# Patient Record
Sex: Female | Born: 1970 | Race: Black or African American | Hispanic: No | Marital: Single | Smoking: Never smoker
Health system: Southern US, Community
[De-identification: ages and names within clinical notes are randomized; demographics above are authoritative.]

## PROBLEM LIST (undated history)

## (undated) DIAGNOSIS — M1990 Unspecified osteoarthritis, unspecified site: Secondary | ICD-10-CM

## (undated) DIAGNOSIS — B019 Varicella without complication: Secondary | ICD-10-CM

## (undated) DIAGNOSIS — I480 Paroxysmal atrial fibrillation: Secondary | ICD-10-CM

## (undated) DIAGNOSIS — Z9289 Personal history of other medical treatment: Secondary | ICD-10-CM

## (undated) DIAGNOSIS — R7303 Prediabetes: Secondary | ICD-10-CM

## (undated) DIAGNOSIS — R519 Headache, unspecified: Secondary | ICD-10-CM

## (undated) DIAGNOSIS — K219 Gastro-esophageal reflux disease without esophagitis: Secondary | ICD-10-CM

## (undated) DIAGNOSIS — I10 Essential (primary) hypertension: Secondary | ICD-10-CM

## (undated) DIAGNOSIS — E785 Hyperlipidemia, unspecified: Secondary | ICD-10-CM

## (undated) DIAGNOSIS — D500 Iron deficiency anemia secondary to blood loss (chronic): Secondary | ICD-10-CM

## (undated) DIAGNOSIS — F32A Depression, unspecified: Secondary | ICD-10-CM

## (undated) DIAGNOSIS — G4730 Sleep apnea, unspecified: Secondary | ICD-10-CM

## (undated) DIAGNOSIS — Z5189 Encounter for other specified aftercare: Secondary | ICD-10-CM

## (undated) HISTORY — DX: Headache, unspecified: R51.9

## (undated) HISTORY — DX: Depression, unspecified: F32.A

## (undated) HISTORY — DX: Unspecified osteoarthritis, unspecified site: M19.90

## (undated) HISTORY — DX: Hyperlipidemia, unspecified: E78.5

## (undated) HISTORY — DX: Personal history of other medical treatment: Z92.89

## (undated) HISTORY — DX: Varicella without complication: B01.9

## (undated) HISTORY — PX: KNEE SURGERY: SHX244

---
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
## 2015-01-05 ENCOUNTER — Ambulatory Visit
Admission: EM | Admit: 2015-01-05 | Discharge: 2015-01-05 | Disposition: A | Discharge status: Home / Self Care | Payer: BLUE CROSS/BLUE SHIELD | Attending: Family Medicine | Admitting: Family Medicine

## 2015-01-05 ENCOUNTER — Encounter: Payer: Self-pay | Admitting: Emergency Medicine

## 2015-01-05 DIAGNOSIS — H6983 Other specified disorders of Eustachian tube, bilateral: Secondary | ICD-10-CM

## 2015-01-05 DIAGNOSIS — J029 Acute pharyngitis, unspecified: Secondary | ICD-10-CM

## 2015-01-05 HISTORY — DX: Essential (primary) hypertension: I10

## 2015-01-05 LAB — RAPID STREP SCREEN (MED CTR MEBANE ONLY): Streptococcus, Group A Screen (Direct): NEGATIVE

## 2015-01-05 MED ORDER — KETOROLAC TROMETHAMINE 60 MG/2ML IM SOLN
60.0000 mg | Freq: Once | INTRAMUSCULAR | Status: AC
  Administered 2015-01-05: 60 mg via INTRAMUSCULAR

## 2015-01-05 MED ORDER — ACETAMINOPHEN 500 MG PO TABS
1000.0000 mg | ORAL_TABLET | Freq: Once | ORAL | Status: AC
  Administered 2015-01-05: 1000 mg via ORAL

## 2015-01-05 MED ORDER — FLUTICASONE PROPIONATE 50 MCG/ACT NA SUSP: 2.0000 | Freq: Every day | NASAL | Status: DC

## 2015-01-05 MED ORDER — AMOXICILLIN-POT CLAVULANATE 875-125 MG PO TABS: 1.0000 | ORAL_TABLET | Freq: Two times a day (BID) | ORAL | Status: AC

## 2015-01-05 MED ORDER — LOSARTAN POTASSIUM-HCTZ 100-25 MG PO TABS
1.0000 | ORAL_TABLET | Freq: Every day | ORAL | Status: DC
Start: 2015-01-05 — End: 2016-05-17

## 2015-01-05 MED ORDER — AMLODIPINE BESYLATE 10 MG PO TABS: 10.0000 mg | ORAL_TABLET | Freq: Every day | ORAL | Status: DC

## 2015-01-05 NOTE — ED Notes (Signed)
Patient c/o sore throat, nasal congestion, and HAs for 3 weeks.

## 2015-01-05 NOTE — ED Provider Notes (Signed)
CSN: [id number]     Arrival date & time 01/05/15  1401 History   First MD Initiated Contact with Patient 01/05/15 1559     Chief Complaint  Patient presents with  . Sore Throat   (Consider location/radiation/quality/duration/timing/severity/associated sxs/prior Treatment) HPI 44 yo F reports 3 week history of URI, sore throat , nasal congestion, headache.  Has treated with OTCs cold combos and ibuprofen/tylenol without success Recently ( 3 months) [place]  and family has struggled with post nasal drip and congestion. Throat has been very sore, fatigued,ears popping, head congested and heavy mucous-repeated 101 temps Has headache-forehead pressure/tight neck muscles)  Past Medical History  Diagnosis Date  . Hypertension    Past Surgical History  Procedure Laterality Date  . Knee surgery Bilateral    Family History  Problem Relation Age of Onset  . Hypertension Father    Social History  Substance Use Topics  . Smoking status: Never Smoker   . Smokeless tobacco: None  . Alcohol Use: Yes   OB History    Obstetric Comments   S/p BTL     Review of Systems.Constitutional: mild  Fever;recurrent.  Eyes: No visual changes. ENTvery sore throat., left side particularly feels like a lump, swallows without hesitation- appetite down but eats at will Cardiovascular:Negative for chest pain/palpitations Respiratory: Negative for shortness of breath Gastrointestinal: No abdominal pain. No nausea,vomiting, diarrhea Genitourinary: Negative for dysuria. Normal urination. On menses now and crampy, s/p BTL. Musculoskeletal: Negative for back pain. FROM extremities without pain Skin: Negative for rash Neurological: positive for headache, negative for  focal weakness or numbness or tingling  Allergies  Review of patient's allergies indicates no known allergies.  Home Medications   Prior to Admission medications   Medication Sig Start Date End Date Taking? Authorizing  Provider  amLODipine (NORVASC) 10 MG tablet Take 10 mg by mouth daily.   Yes Historical Provider, MD  losartan-hydrochlorothiazide (HYZAAR) 100-25 MG tablet Take 1 tablet by mouth daily.   Yes Historical Provider, MD  amLODipine (NORVASC) 10 MG tablet Take 1 tablet (10 mg total) by mouth daily. 01/05/15   Rae HalstedLaurie W Tekeya Geffert, PA-C  amoxicillin-clavulanate (AUGMENTIN) 875-125 MG tablet Take 1 tablet by mouth 2 (two) times daily. 01/05/15 01/15/15  Rae HalstedLaurie W Isys Tietje, PA-C  fluticasone (FLONASE) 50 MCG/ACT nasal spray Place 2 sprays into both nostrils daily. 01/05/15   Rae HalstedLaurie W Dosia Yodice, PA-C  losartan-hydrochlorothiazide (HYZAAR) 100-25 MG tablet Take 1 tablet by mouth daily. 01/05/15   Rae HalstedLaurie W Daanish Copes, PA-C   Meds Ordered and Administered this Visit   Medications  acetaminophen (TYLENOL) tablet 1,000 mg (1,000 mg Oral Given 01/05/15 1600)  ketorolac (TORADOL) injection 60 mg (60 mg Intramuscular Given 01/05/15 1655)  well tolerated Tylenol in admission, Ketoralac by provider with marked improvement overall malaise and oral discomfort. Wrapped in warm blanket and neck wrap with second heated blanket-much improved.  BP 167/97 mmHg  Pulse 98  Temp(Src) 100.8 F (38.2 C) (Tympanic)  Resp 16  Ht 5\' 4"  (1.626 m)  Wt 180 lb (81.647 kg)  BMI 30.88 kg/m2  SpO2 100%  LMP 01/02/2015 (Exact Date) No data found.   Physical Exam  General: NAD, does not appear toxic,appears tired,face full- 100.8 before triage tylenol HEENT:normocephalic,atraumatic, mucous membranes moist, lips hydrated, moderate erythema on edematous tonsillar pillars; STAT STREP neg;  no evidence peri-tonsilar abscess; tonsils cryptic with debris and exudate Ears:grossly normal hearing, pop and squeak with valsalva, TMs dull with shifted light reflex   mild frontal headache ,  neck muscle tension, neuro neg Eyes: EOMI, conjunctiva clear, conjugate gaze, peri-orbital pressure Neck: supple,positive tender ant chain lymphadenopathy; FROM neck, no  nuchal rigidity Resp : CT A, bilat; normal respiratory effort, no SOB Card : RRR, BP elevated, has not been on BP Rx since Rx expired-scheduled to be seen Abd:  Not distended Skin: no rash, skin intact MSK: no deformities, ambulatory without assistance Neuro : good attention,recall-good memory,  positive for mild frontal headache, no neuro deficits noted , no focal weakness or numbness ED Course  Procedures (including critical care time)  Labs Review Labs Reviewed  RAPID STREP SCREEN (NOT AT [facility])  CULTURE, GROUP A STREP ([facility] ONLY)   Results for orders placed or performed during the hospital encounter of 01/05/15  Rapid strep screen  Result Value Ref Range   Streptococcus, Group A Screen (Direct) NEGATIVE NEGATIVE   3 day follow up call for final strep discussed  Imaging Review No results found.      MDM   1. Eustachian tube dysfunction, bilateral   2. Pharyngitis    Felt greatly improved with Toradol x 1- Encourage cyclic tylenol /ibuprofen Diagnosis and treatment discussed. 3 day call encouraged Questions fielded, expectations and recommendations reviewed Steamy shower and increase fluids orally Has one more shift this evening and then off until next week.-  Discussed follow up and return parameters including no resolution or any worsening condition..  Patient expresses understanding  and agrees to plan. Will return to [facility] with questions, concerns or exacerbation.  BP medication renewed- keep appointment w PCP Discharge Medication List as of 01/05/2015  5:04 PM    START taking these medications   Details  !! amLODipine (NORVASC) 10 MG tablet Take 1 tablet (10 mg total) by mouth daily., Starting 01/05/2015, Until Discontinued, Print    amoxicillin-clavulanate (AUGMENTIN) 875-125 MG tablet Take 1 tablet by mouth 2 (two) times daily., Starting 01/05/2015, Until Fri 01/15/15, Print    fluticasone (FLONASE) 50 MCG/ACT nasal spray Place 2 sprays into both nostrils  daily., Starting 01/05/2015, Until Discontinued, Normal    !! losartan-hydrochlorothiazide (HYZAAR) 100-25 MG tablet Take 1 tablet by mouth daily., Starting 01/05/2015, Until Discontinued, Print     !! - Potential duplicate medications found. Please discuss with provider.      ,  Rae Halsted, PA-C 01/05/15 2304     Rae Halsted, PA-C 01/05/15 [phone]

## 2015-01-05 NOTE — Discharge Instructions (Signed)
Add Zyrtec ( cetirizine) 10 mg  1 a day ( twice daily for 3-5 days).. Consider continuing .Marland Kitchen.  Fluticasone 1 spray per nostril twice daily for 3-5 days, then reduce to daily  Augmentin 875 mg  antibiotic 1 twice daily for  for 10 days    Ibuprofen /Tylenol cyclic alternating every 6 hours   CALL BACK IN 3 DAYS FOR FINAL STREP REPORT !! Blood pressure medications have been refilled-please take as previously directed    Barotitis Media Barotitis media is inflammation of your middle ear. This occurs when the auditory tube (eustachian tube) leading from the back of your nose (nasopharynx) to your eardrum is blocked. This blockage may result from a cold, environmental allergies, or an upper respiratory infection. Unresolved barotitis media may lead to damage or hearing loss (barotrauma), which may become permanent. HOME CARE INSTRUCTIONS   Use medicines as recommended by your health care provider. Over-the-counter medicines will help unblock the canal and can help during times of air travel.  Do not put anything into your ears to clean or unplug them. Eardrops will not be helpful.  Do not swim, dive, or fly until your health care provider says it is all right to do so. If these activities are necessary, chewing gum with frequent, forceful swallowing may help. It is also helpful to hold your nose and gently blow to pop your ears for equalizing pressure changes. This forces air into the eustachian tube.  Only take over-the-counter or prescription medicines for pain, discomfort, or fever as directed by your health care provider.  A decongestant may be helpful in decongesting the middle ear and make pressure equalization easier. SEEK MEDICAL CARE IF:  You experience a serious form of dizziness in which you feel as if the room is spinning and you feel nauseated (vertigo).  Your symptoms only involve one ear. SEEK IMMEDIATE MEDICAL CARE IF:   You develop a severe headache, dizziness, or severe  ear pain.  You have bloody or pus-like drainage from your ears.  You develop a fever.  Your problems do not improve or become worse. MAKE SURE YOU:   Understand these instructions.  Will watch your condition.  Will get help right away if you are not doing well or get worse.   This information is not intended to replace advice given to you by your health care provider. Make sure you discuss any questions you have with your health care provider.   Document Released: 12/31/1999 Document Revised: 10/23/2012 Document Reviewed: 07/30/2012 Elsevier Interactive Patient Education 2016 Else[employer] Inc[employer]  Pharyngitis Pharyngitis is a sore throat (pharynx). There is redness, pain, and swelling of your throat. HOME CARE   Drink enough fluids to keep your pee (urine) clear or pale yellow.  Only take medicine as told by your doctor.  You may get sick again if you do not take medicine as told. Finish your medicines, even if you start to feel better.  Do not take aspirin.  Rest.  Rinse your mouth (gargle) with salt water ( tsp of salt per 1 qt of water) every 1-2 hours. This will help the pain.  If you are not at risk for choking, you can suck on hard candy or sore throat lozenges. GET HELP IF:  You have large, tender lumps on your neck.  You have a rash.  You cough up green, yellow-brown, or bloody spit. GET HELP RIGHT AWAY IF:   You have a stiff neck.  You drool or cannot swallow liquids.  You throw up (vomit) or are not able to keep medicine or liquids down.  You have very bad pain that does not go away with medicine.  You have problems breathing (not from a stuffy nose). MAKE SURE YOU:   Understand these instructions.  Will watch your condition.  Will get help right away if you are not doing well or get worse.   This information is not intended to replace advice given to you by your health care provider. Make sure you discuss any questions you have with your health  care provider.   Document Released: 06/21/2007 Document Revised: 10/23/2012 Document Reviewed: 09/09/2012 Elsevier Interactive Patient Education [employer]

## 2015-01-08 LAB — CULTURE, GROUP A STREP (THRC)

## 2015-03-18 DIAGNOSIS — G44229 Chronic tension-type headache, not intractable: Secondary | ICD-10-CM | POA: Insufficient documentation

## 2015-03-18 DIAGNOSIS — E789 Disorder of lipoprotein metabolism, unspecified: Secondary | ICD-10-CM | POA: Insufficient documentation

## 2015-03-18 DIAGNOSIS — I10 Essential (primary) hypertension: Secondary | ICD-10-CM | POA: Insufficient documentation

## 2015-03-24 DIAGNOSIS — R7303 Prediabetes: Secondary | ICD-10-CM | POA: Insufficient documentation

## 2015-03-31 ENCOUNTER — Other Ambulatory Visit: Payer: Self-pay | Admitting: Family Medicine

## 2015-03-31 DIAGNOSIS — Z1231 Encounter for screening mammogram for malignant neoplasm of breast: Secondary | ICD-10-CM

## 2015-04-01 ENCOUNTER — Ambulatory Visit: Payer: BLUE CROSS/BLUE SHIELD

## 2015-04-06 ENCOUNTER — Ambulatory Visit
Admission: RE | Admit: 2015-04-06 | Discharge: 2015-04-06 | Disposition: A | Discharge status: Home / Self Care | Payer: BLUE CROSS/BLUE SHIELD | Source: Ambulatory Visit | Attending: Family Medicine | Admitting: Family Medicine

## 2015-04-06 DIAGNOSIS — Z1231 Encounter for screening mammogram for malignant neoplasm of breast: Secondary | ICD-10-CM | POA: Diagnosis present

## 2016-05-17 ENCOUNTER — Inpatient Hospital Stay
Admission: EM | Admit: 2016-05-17 | Discharge: 2016-05-19 | DRG: 379 | Disposition: A | Discharge status: Home / Self Care | Payer: BLUE CROSS/BLUE SHIELD | Attending: Internal Medicine | Admitting: Internal Medicine

## 2016-05-17 ENCOUNTER — Encounter: Payer: Self-pay | Admitting: *Deleted

## 2016-05-17 DIAGNOSIS — E669 Obesity, unspecified: Secondary | ICD-10-CM | POA: Diagnosis present

## 2016-05-17 DIAGNOSIS — D509 Iron deficiency anemia, unspecified: Secondary | ICD-10-CM

## 2016-05-17 DIAGNOSIS — R531 Weakness: Secondary | ICD-10-CM | POA: Diagnosis present

## 2016-05-17 DIAGNOSIS — D649 Anemia, unspecified: Secondary | ICD-10-CM | POA: Diagnosis present

## 2016-05-17 DIAGNOSIS — R600 Localized edema: Secondary | ICD-10-CM | POA: Diagnosis present

## 2016-05-17 DIAGNOSIS — K921 Melena: Principal | ICD-10-CM

## 2016-05-17 DIAGNOSIS — Z6835 Body mass index (BMI) 35.0-35.9, adult: Secondary | ICD-10-CM | POA: Diagnosis not present

## 2016-05-17 DIAGNOSIS — F1010 Alcohol abuse, uncomplicated: Secondary | ICD-10-CM | POA: Diagnosis present

## 2016-05-17 DIAGNOSIS — K641 Second degree hemorrhoids: Secondary | ICD-10-CM | POA: Diagnosis present

## 2016-05-17 DIAGNOSIS — Z79899 Other long term (current) drug therapy: Secondary | ICD-10-CM

## 2016-05-17 DIAGNOSIS — R51 Headache: Secondary | ICD-10-CM | POA: Diagnosis present

## 2016-05-17 DIAGNOSIS — K449 Diaphragmatic hernia without obstruction or gangrene: Secondary | ICD-10-CM | POA: Diagnosis present

## 2016-05-17 DIAGNOSIS — I10 Essential (primary) hypertension: Secondary | ICD-10-CM | POA: Diagnosis present

## 2016-05-17 LAB — URINALYSIS, COMPLETE (UACMP) WITH MICROSCOPIC
Bacteria, UA: NONE SEEN
Bilirubin Urine: NEGATIVE
GLUCOSE, UA: NEGATIVE mg/dL
HGB URINE DIPSTICK: NEGATIVE
Ketones, ur: NEGATIVE mg/dL
Leukocytes, UA: NEGATIVE
NITRITE: NEGATIVE
PH: 7 (ref 5.0–8.0)
Protein, ur: NEGATIVE mg/dL
RBC / HPF: NONE SEEN RBC/hpf (ref 0–5)
SPECIFIC GRAVITY, URINE: 1.006 (ref 1.005–1.030)
WBC, UA: NONE SEEN WBC/hpf (ref 0–5)

## 2016-05-17 LAB — BASIC METABOLIC PANEL
ANION GAP: 5 (ref 5–15)
BUN: 15 mg/dL (ref 6–20)
CALCIUM: 9 mg/dL (ref 8.9–10.3)
CO2: 24 mmol/L (ref 22–32)
Chloride: 106 mmol/L (ref 101–111)
Creatinine, Ser: 0.97 mg/dL (ref 0.44–1.00)
GFR calc Af Amer: >60 mL/min (ref >60)
GLUCOSE: 100 mg/dL — AB (ref 65–99)
Potassium: 4.2 mmol/L (ref 3.5–5.1)
Sodium: 135 mmol/L (ref 135–145)

## 2016-05-17 LAB — CBC
HCT: 19.1 % — ABNORMAL LOW (ref 35.0–47.0)
Hemoglobin: 5.5 g/dL — ABNORMAL LOW (ref 12.0–16.0)
MCH: 17.9 pg — ABNORMAL LOW (ref 26.0–34.0)
MCHC: 28.7 g/dL — AB (ref 32.0–36.0)
MCV: 62.4 fL — ABNORMAL LOW (ref 80.0–100.0)
PLATELETS: 263 10*3/uL (ref 150–440)
RBC: 3.05 MIL/uL — ABNORMAL LOW (ref 3.80–5.20)
RDW: 32.6 % — AB (ref 11.5–14.5)
WBC: 3.6 10*3/uL (ref 3.6–11.0)

## 2016-05-17 LAB — ABO/RH: ABO/RH(D): B POS

## 2016-05-17 LAB — PREPARE RBC (CROSSMATCH)

## 2016-05-17 MED ORDER — ADULT MULTIVITAMIN W/MINERALS CH
1.0000 | ORAL_TABLET | Freq: Every day | ORAL | Status: DC
  Administered 2016-05-18: 1 via ORAL
  Filled 2016-05-17: qty 1

## 2016-05-17 MED ORDER — PANTOPRAZOLE SODIUM 40 MG IV SOLR
40.0000 mg | Freq: Two times a day (BID) | INTRAVENOUS | Status: DC
  Administered 2016-05-17 – 2016-05-18 (×3): 40 mg via INTRAVENOUS
  Filled 2016-05-17 (×3): qty 40

## 2016-05-17 MED ORDER — SODIUM CHLORIDE 0.9 % IV SOLN
10.0000 mL/h | Freq: Once | INTRAVENOUS | Status: AC
  Administered 2016-05-18: 10 mL/h via INTRAVENOUS

## 2016-05-17 MED ORDER — ACETAMINOPHEN 325 MG PO TABS: 650.0000 mg | ORAL_TABLET | Freq: Four times a day (QID) | ORAL | Status: DC | PRN

## 2016-05-17 MED ORDER — LABETALOL HCL 5 MG/ML IV SOLN
10.0000 mg | Freq: Once | INTRAVENOUS | Status: AC
  Administered 2016-05-17: 10 mg via INTRAVENOUS
  Filled 2016-05-17: qty 4

## 2016-05-17 MED ORDER — SODIUM CHLORIDE 0.9 % IV SOLN
250.0000 mL | INTRAVENOUS | Status: DC | PRN
  Administered 2016-05-19: 11:00:00 via INTRAVENOUS

## 2016-05-17 MED ORDER — ACETAMINOPHEN 650 MG RE SUPP: 650.0000 mg | Freq: Four times a day (QID) | RECTAL | Status: DC | PRN

## 2016-05-17 MED ORDER — LORAZEPAM 1 MG PO TABS: 1.0000 mg | ORAL_TABLET | Freq: Four times a day (QID) | ORAL | Status: DC | PRN

## 2016-05-17 MED ORDER — SENNOSIDES-DOCUSATE SODIUM 8.6-50 MG PO TABS: 1.0000 | ORAL_TABLET | Freq: Every evening | ORAL | Status: DC | PRN

## 2016-05-17 MED ORDER — SODIUM CHLORIDE 0.9% FLUSH
3.0000 mL | Freq: Two times a day (BID) | INTRAVENOUS | Status: DC
  Administered 2016-05-17 – 2016-05-18 (×2): 3 mL via INTRAVENOUS

## 2016-05-17 MED ORDER — ONDANSETRON HCL 4 MG PO TABS: 4.0000 mg | ORAL_TABLET | Freq: Four times a day (QID) | ORAL | Status: DC | PRN

## 2016-05-17 MED ORDER — LORAZEPAM 2 MG/ML IJ SOLN: 1.0000 mg | Freq: Four times a day (QID) | INTRAMUSCULAR | Status: DC | PRN

## 2016-05-17 MED ORDER — VITAMIN B-1 100 MG PO TABS
100.0000 mg | ORAL_TABLET | Freq: Every day | ORAL | Status: DC
  Administered 2016-05-18: 100 mg via ORAL
  Filled 2016-05-17: qty 1

## 2016-05-17 MED ORDER — LABETALOL HCL 5 MG/ML IV SOLN
10.0000 mg | INTRAVENOUS | Status: DC | PRN
  Administered 2016-05-18: 10 mg via INTRAVENOUS
  Filled 2016-05-17 (×2): qty 4

## 2016-05-17 MED ORDER — SODIUM CHLORIDE 0.9% FLUSH: 3.0000 mL | INTRAVENOUS | Status: DC | PRN

## 2016-05-17 MED ORDER — FOLIC ACID 1 MG PO TABS
1.0000 mg | ORAL_TABLET | Freq: Every day | ORAL | Status: DC
  Administered 2016-05-18: 1 mg via ORAL
  Filled 2016-05-17: qty 1

## 2016-05-17 MED ORDER — SODIUM CHLORIDE 0.9 % IV SOLN
INTRAVENOUS | Status: AC
  Administered 2016-05-18 (×2): via INTRAVENOUS

## 2016-05-17 MED ORDER — THIAMINE HCL 100 MG/ML IJ SOLN: 100.0000 mg | Freq: Every day | INTRAMUSCULAR | Status: DC

## 2016-05-17 MED ORDER — ONDANSETRON HCL 4 MG/2ML IJ SOLN: 4.0000 mg | Freq: Four times a day (QID) | INTRAMUSCULAR | Status: DC | PRN

## 2016-05-17 MED ORDER — ALBUTEROL SULFATE (2.5 MG/3ML) 0.083% IN NEBU: 2.5000 mg | INHALATION_SOLUTION | RESPIRATORY_TRACT | Status: DC | PRN

## 2016-05-17 NOTE — H&P (Signed)
[facility] [place] [facility]   PATIENT NAME: Madison Lewis    MR#:  [id number]  DATE OF BIRTH:  1970-09-27  DATE OF ADMISSION:  05/17/2016  PRIMARY CARE PHYSICIAN: Cyndie Mull, DO   REQUESTING/REFERRING PHYSICIAN: Minna Antis, MD  CHIEF COMPLAINT:   Chief Complaint  Patient presents with  . Anemia  . Dizziness   Dizziness and generalized weakness for 2 months. HISTORY OF PRESENT ILLNESS:  Madison Lewis  is a 46 y.o. female with a known history of Hypertension. She presents to the ED with the above chief complaints. She said she has had rectal bleeding on and off for the past 2 months. The last rectal bleeding was 2 weeks ago. She has had worsening fatigue and generalized weakness. She also complains of dizziness and dyspnea on exertion. She made an appointment with her primary care doctor today and had labs drawn showing a hemoglobin of 5.3, so the patient was told to go to the emergency department.  But the stool occult is negative. She said she drinks beer one can every other day for the past 2 years.  PAST MEDICAL HISTORY:   Past Medical History:  Diagnosis Date  . Hypertension     PAST SURGICAL HISTORY:   Past Surgical History:  Procedure Laterality Date  . KNEE SURGERY Bilateral     SOCIAL HISTORY:   Social History  Substance Use Topics  . Smoking status: Never Smoker  . Smokeless tobacco: Not on file  . Alcohol use Yes    FAMILY HISTORY:   Family History  Problem Relation Age of Onset  . Hypertension Father     DRUG ALLERGIES:  No Known Allergies  REVIEW OF SYSTEMS:   Review of Systems  Constitutional: Positive for malaise/fatigue. Negative for chills, fever and weight loss.  HENT: Negative for congestion and nosebleeds.   Eyes: Negative for blurred vision and double vision.  Respiratory: Positive for shortness of breath. Negative for cough, hemoptysis and stridor.   Cardiovascular: Positive for  leg swelling. Negative for chest pain.  Gastrointestinal: Positive for blood in stool. Negative for abdominal pain, constipation, diarrhea, heartburn, melena, nausea and vomiting.  Genitourinary: Negative for dysuria and hematuria.  Musculoskeletal: Negative for back pain.  Skin: Negative for itching and rash.  Neurological: Positive for dizziness and weakness. Negative for focal weakness, loss of consciousness and headaches.  Psychiatric/Behavioral: Negative for depression. The patient is not nervous/anxious.     MEDICATIONS AT HOME:   Prior to Admission medications   Medication Sig Start Date End Date Taking? Authorizing Provider  amLODipine (NORVASC) 10 MG tablet Take 10 mg by mouth daily.    Historical Provider, MD  amLODipine (NORVASC) 10 MG tablet Take 1 tablet (10 mg total) by mouth daily. 01/05/15   Rae Halsted, PA-C  fluticasone [facility]) 50 MCG/ACT nasal spray Place 2 sprays into both nostrils daily. 01/05/15   Rae Halsted, PA-C  losartan-hydrochlorothiazide (HYZAAR) 100-25 MG tablet Take 1 tablet by mouth daily.    Historical Provider, MD  losartan-hydrochlorothiazide (HYZAAR) 100-25 MG tablet Take 1 tablet by mouth daily. 01/05/15   Rae Halsted, PA-C      VITAL SIGNS:  Blood pressure (!) 153/81, pulse 100, temperature 97.8 F (36.6 C), temperature source Oral, resp. rate 20, height  (1.626 m), weight 209 lb (94.8 kg), last menstrual period 04/26/2016, SpO2 99 %.  PHYSICAL EXAMINATION:  Physical Exam  GENERAL:  46 y.o.-year-old patient lying in the bed with  no acute distress. Obese. EYES: Pupils equal, round, reactive to light and accommodation. No scleral icterus. Extraocular muscles intact. Pale conjunctiva. HEENT: Head atraumatic, normocephalic. Oropharynx and nasopharynx clear.  NECK:  Supple, no jugular venous distention. No thyroid enlargement, no tenderness.  LUNGS: Normal breath sounds bilaterally, no wheezing, rales,rhonchi or crepitation. No use of  accessory muscles of respiration.  CARDIOVASCULAR: S1, S2 normal. No murmurs, rubs, or gallops.  ABDOMEN: Soft, nontender, nondistended. Bowel sounds present. No organomegaly or mass.  EXTREMITIES: Bilateral leg trace edema. No cyanosis, or clubbing.  NEUROLOGIC: Cranial nerves II through XII are intact. Muscle strength 5/5 in all extremities. Sensation intact. Gait not checked.  PSYCHIATRIC: The patient is alert and oriented x 3.  SKIN: No obvious rash, lesion, or ulcer.   LABORATORY PANEL:   CBC  Recent Labs Lab 05/17/16 1924  WBC 3.6  HGB 5.5*  HCT 19.1*  PLT 263   ------------------------------------------------------------------------------------------------------------------  Chemistries   Recent Labs Lab 05/17/16 1924  NA 135  K 4.2  CL 106  CO2 24  GLUCOSE 100*  BUN 15  CREATININE 0.97  CALCIUM 9.0   ------------------------------------------------------------------------------------------------------------------  Cardiac Enzymes No results for input(s): TROPONINI in the last 168 hours. ------------------------------------------------------------------------------------------------------------------  RADIOLOGY:  No results found.    IMPRESSION AND PLAN:   Symptomatic anemia. The patient will be admitted to medical floor. She will receive 2 units PRBC transfusion. Follow-up hemoglobin in a.m.  GI bleeding. Start Protonix IV twice a day. Keep by mouth with IV fluid support. GI consult. Leg edema with alcohol abuse. Follow-up the liver function tests. May need abdominal ultrasound. But to defer to GI consult.  Hypertension. Continue home hypertension medication.  Alcohol abuse. Start CIWA protocol. Obesity. Check lipid panel.  All the records are reviewed and case discussed with ED provider. Management plans discussed with the patient, family and they are in agreement.  CODE STATUS: Full code.  TOTAL TIME TAKING CARE OF THIS PATIENT: 57 minutes.     Shaune Pollack M.D on 05/17/2016 at 9:21 PM  Between 7am to 6pm - Pager - [phone]  After 6pm go to [email or web address] - Social research officer, government  [facility]  Office  [phone]  CC: Primary care physician; Cyndie Mull, DO   Note: This dictation was prepared with Dragon dictation along with smaller phrase technology. Any transcriptional errors that result from this process are unintentional.

## 2016-05-17 NOTE — ED Notes (Signed)
Charge nurse notified of pt's H&H; will take pt to next available exam room

## 2016-05-17 NOTE — ED Provider Notes (Signed)
[facility] Emergency Department Provider Note  Time seen: 8:51 PM  I have reviewed the triage vital signs and the nursing notes.   HISTORY  Chief Complaint Anemia and Dizziness    HPI Madison Lewis is a 46 y.o. female with a past medical history of hypertension who presents to the emergency Department for reported low hemoglobin. According to the patient for the past 2 months she has been feeling extremely weak and fatigued. She noted 2 weeks ago blood in her stool. She made an appointment with her primary care doctor which was today. Patient said she had labs drawn showing a hemoglobin of 5.3 so the patient was told to go to the emergency department. Patient denies any history of known anemia. Does state occasionally has heavy periods but denies any recently. Last period was 04/30/16 and was light per patient. Denies any nausea, vomiting, diarrhea, abdominal pain. Patient states 2 weeks ago she was having bleeding with her stool but that has since resolved.Patient's main symptom of the past several months has been generalized fatigue/weakness along with moderate dyspnea with exertion. States she would have to stop and rest when walking long distances.  Past Medical History:  Diagnosis Date  . Hypertension     There are no active problems to display for this patient.   Past Surgical History:  Procedure Laterality Date  . KNEE SURGERY Bilateral     Prior to Admission medications   Medication Sig Start Date End Date Taking? Authorizing Provider  amLODipine (NORVASC) 10 MG tablet Take 10 mg by mouth daily.    Historical Provider, MD  amLODipine (NORVASC) 10 MG tablet Take 1 tablet (10 mg total) by mouth daily. 01/05/15   Rae Halsted, PA-C  fluticasone [facility]) 50 MCG/ACT nasal spray Place 2 sprays into both nostrils daily. 01/05/15   Rae Halsted, PA-C  losartan-hydrochlorothiazide (HYZAAR) 100-25 MG tablet Take 1 tablet by mouth daily.    Historical  Provider, MD  losartan-hydrochlorothiazide (HYZAAR) 100-25 MG tablet Take 1 tablet by mouth daily. 01/05/15   Rae Halsted, PA-C    No Known Allergies  Family History  Problem Relation Age of Onset  . Hypertension Father     Social History Social History  Substance Use Topics  . Smoking status: Never Smoker  . Smokeless tobacco: Not on file  . Alcohol use Yes    Review of Systems Constitutional: Negative for fever. Cardiovascular: Intermittent chest pain over the past 2 months none currently. Respiratory: Positive shortness of breath with exertion, none at rest. Gastrointestinal: Negative for abdominal pain, vomiting and diarrhea. Blood in her stool 2 weeks ago, now resolved Genitourinary: Negative for dysuria. Occasional heavy periods, denies any recently. Musculoskeletal: Negative for back pain. Skin: Negative for rash. Neurological: Negative for headache All other ROS negative  ____________________________________________   PHYSICAL EXAM:  VITAL SIGNS: ED Triage Vitals  Enc Vitals Group     BP 05/17/16 1919 (!) 153/81     Pulse Rate 05/17/16 1919 100     Resp 05/17/16 1919 20     Temp 05/17/16 1919 97.8 F (36.6 C)     Temp Source 05/17/16 1919 Oral     SpO2 05/17/16 1919 99 %     Weight 05/17/16 1917 209 lb (94.8 kg)     Height 05/17/16 1917  (1.626 m)     Head Circumference --      Peak Flow --      Pain Score 05/17/16 2036 0  Pain Loc --      Pain Edu? --      Excl. in GC? --     Constitutional: Alert and oriented. Well appearing and in no distress. Eyes: Normal exam ENT   Head: Normocephalic and atraumatic.   Mouth/Throat: Mucous membranes are moist. Cardiovascular: Normal rate, regular rhythm. No murmur Respiratory: Normal respiratory effort without tachypnea nor retractions. Breath sounds are clear Gastrointestinal: Soft and nontender. No distention. Musculoskeletal: Nontender with normal range of motion in all extremities.   Neurologic:  Normal speech and language. No gross focal neurologic deficits  Skin:  Skin is warm, dry and intact.  Psychiatric: Mood and affect are normal.  ____________________________________________    EKG  EKG reviewed and interpreted by myself shows normal sinus rhythm at 95 bpm, narrow QRS, normal axis, normal intervals, no concerning ST changes.  ____________________________________________   INITIAL IMPRESSION / ASSESSMENT AND PLAN / ED COURSE  Pertinent labs & imaging results that were available during my care of the patient were reviewed by me and considered in my medical decision making (see chart for details).  Patient presents to the emergency department for reported low hemoglobin at a primary care office. Patient states she saw her primary care doctor for 2 months of generalized fatigue/weakness with intermittent chest pain and dyspnea on exertion as well as rectal bleeding 2 weeks ago that has since resolved. Patient's rectal exam in the emergency department shows a moderate-sized external hemorrhoid, nontender exam, light brown stool guaiac negative. Patient is anemic to 5.5 in the emergency department. Given the patient's symptomatic anemia we will admit to the hospital for blood transfusion. Patient is agreeable to plan. I have consented the patient for blood products.  CRITICAL CARE Performed by: Minna Antis   Total critical care time: 30 minutes  Critical care time was exclusive of separately billable procedures and treating other patients.  Critical care was necessary to treat or prevent imminent or life-threatening deterioration.  Critical care was time spent personally by me on the following activities: development of treatment plan with patient and/or surrogate as well as nursing, discussions with consultants, evaluation of patient's response to treatment, examination of patient, obtaining history from patient or surrogate, ordering and performing  treatments and interventions, ordering and review of laboratory studies, ordering and review of radiographic studies, pulse oximetry and re-evaluation of patient's condition.   ____________________________________________   FINAL CLINICAL IMPRESSION(S) / ED DIAGNOSES  Symptomatic anemia    Minna Antis, MD 05/17/16 2055

## 2016-05-17 NOTE — ED Notes (Signed)
Pt reports that her PCP called her today and told her to come to the er due to Hgb low - she went to PCP today for c/o chest pain, shortness of breath and dizziness - pt reports chest pain and shortness of breath have resolved but that she is still dizzy

## 2016-05-17 NOTE — ED Notes (Addendum)
Attempted IV X1 in left upper arm without success - Dr Lenard Lance in room and performed rectal exam - negative for occult blood at this time - pt was advised by provider that she would be admitted and given type specific blood when she arrived to the floor

## 2016-05-17 NOTE — ED Notes (Signed)
Lab called to report Type & Screen needs redraw; will wait for H&H results for further draw to be performed, triage nurse notified

## 2016-05-17 NOTE — ED Triage Notes (Signed)
Patient reports received call from PMD at [facility] that she was very anemic and needed a blood transfusion.

## 2016-05-18 ENCOUNTER — Encounter: Payer: Self-pay | Admitting: *Deleted

## 2016-05-18 DIAGNOSIS — D649 Anemia, unspecified: Secondary | ICD-10-CM

## 2016-05-18 DIAGNOSIS — K921 Melena: Secondary | ICD-10-CM

## 2016-05-18 LAB — LIPID PANEL
CHOLESTEROL: 150 mg/dL (ref 0–200)
HDL: 43 mg/dL (ref >40)
LDL Cholesterol: 87 mg/dL (ref 0–99)
TRIGLYCERIDES: 100 mg/dL (ref <150)
Total CHOL/HDL Ratio: 3.5 RATIO
VLDL: 20 mg/dL (ref 0–40)

## 2016-05-18 LAB — HEMOGLOBIN: Hemoglobin: 8.1 g/dL — ABNORMAL LOW (ref 12.0–16.0)

## 2016-05-18 LAB — HEMOGLOBIN AND HEMATOCRIT, BLOOD
HCT: 26.6 % — ABNORMAL LOW (ref 35.0–47.0)
HEMATOCRIT: 26.4 % — AB (ref 35.0–47.0)
HEMATOCRIT: 27.1 % — AB (ref 35.0–47.0)
HEMOGLOBIN: 8.5 g/dL — AB (ref 12.0–16.0)
Hemoglobin: 8.3 g/dL — ABNORMAL LOW (ref 12.0–16.0)
Hemoglobin: 8.2 g/dL — ABNORMAL LOW (ref 12.0–16.0)

## 2016-05-18 LAB — HEPATIC FUNCTION PANEL
ALBUMIN: 3.5 g/dL (ref 3.5–5.0)
ALT: 13 U/L — ABNORMAL LOW (ref 14–54)
AST: 20 U/L (ref 15–41)
Alkaline Phosphatase: 56 U/L (ref 38–126)
BILIRUBIN TOTAL: 1.5 mg/dL — AB (ref 0.3–1.2)
Bilirubin, Direct: <0.1 mg/dL — ABNORMAL LOW (ref 0.1–0.5)
TOTAL PROTEIN: 6.6 g/dL (ref 6.5–8.1)

## 2016-05-18 MED ORDER — POLYETHYLENE GLYCOL 3350 17 GM/SCOOP PO POWD
1.0000 | Freq: Once | ORAL | Status: AC
  Administered 2016-05-18: 255 g via ORAL
  Filled 2016-05-18: qty 255

## 2016-05-18 NOTE — Consult Note (Signed)
Midge Minium, MD [facility]  [address] [place], [place] [address] Phone: [phone] Fax : [phone] Consultation  Referring Provider:     Dr. Imogene Burn Primary Care Physician:  Cyndie Mull, DO Primary Gastroenterologist:  None         Reason for Consultation:     Anemia and dizziness  Date of Admission:  05/17/2016 Date of Consultation:  05/18/2016         HPI:   Madison Lewis is a 46 y.o. female who has a history of rectal bleeding for last 2 weeks. The patient states the rectal bleeding has been off and on. The patient also reports that she takes anti-inflammatories regularly for chronic headaches. The patient was admitted with a hemoglobin of 5.3. The patient denies any black stools or bloody stools and her Hemoccult was negative. She denies any alcohol abuse but drinks 1 beer a day. The patient was admitted after she came to the emergency room with worsening fatigue and generalized weakness. The patient reports that she had an upper endoscopy in the past but has never had a colonoscopy.  Past Medical History:  Diagnosis Date  . Hypertension     Past Surgical History:  Procedure Laterality Date  . CESAREAN SECTION    . KNEE SURGERY Bilateral     Prior to Admission medications   Medication Sig Start Date End Date Taking? Authorizing Provider  losartan-hydrochlorothiazide (HYZAAR) 100-25 MG tablet Take 1 tablet by mouth daily.   Yes Historical Provider, MD  metoprolol tartrate (LOPRESSOR) 25 MG tablet Take 1 tablet by mouth 2 (two) times daily. 05/16/16  Yes Historical Provider, MD    Family History  Problem Relation Age of Onset  . Hypertension Father      Social History  Substance Use Topics  . Smoking status: Never Smoker  . Smokeless tobacco: Never Used  . Alcohol use 0.6 oz/week    1 Cans of beer per week    Allergies as of 05/17/2016  . (No Known Allergies)    Review of Systems:    All systems reviewed and negative except where noted  in HPI.   Physical Exam:  Vital signs in last 24 hours: Temp:  [97.6 F (36.4 C)-98.3 F (36.8 C)] 98.3 F (36.8 C) (05/03 1246) Pulse Rate:  [71-100] 73 (05/03 1246) Resp:  [16-20] 20 (05/03 1246) BP: (137-177)/(73-93) 167/93 (05/03 1246) SpO2:  [99 %-100 %] 99 % (05/03 1246) Weight:  [207 lb (93.9 kg)-209 lb (94.8 kg)] 207 lb (93.9 kg) (05/02 2306) Last BM Date: 05/17/16 General:   Pleasant, cooperative in NAD Head:  Normocephalic and atraumatic. Eyes:   No icterus.   Conjunctiva pink. PERRLA. Ears:  Normal auditory acuity. Neck:  Supple; no masses or thyroidomegaly Lungs: Respirations even and unlabored. Lungs clear to auscultation bilaterally.   No wheezes, crackles, or rhonchi.  Heart:  Regular rate and rhythm;  Without murmur, clicks, rubs or gallops Abdomen:  Soft, nondistended, nontender. Normal bowel sounds. No appreciable masses or hepatomegaly.  No rebound or guarding.  Rectal:  Not performed. Msk:  Symmetrical without gross deformities.    Extremities:  Without edema, cyanosis or clubbing. Neurologic:  Alert and oriented x3;  grossly normal neurologically. Skin:  Intact without significant lesions or rashes. Cervical Nodes:  No significant cervical adenopathy. Psych:  Alert and cooperative. Normal affect.  LAB RESULTS:  Recent Labs  05/17/16 1924 05/18/16 0638 05/18/16 1002  WBC 3.6  --   --   HGB  5.5* 8.1* 8.3*  HCT 19.1*  --  26.4*  PLT 263  --   --    BMET  Recent Labs  05/17/16 1924  NA 135  K 4.2  CL 106  CO2 24  GLUCOSE 100*  BUN 15  CREATININE 0.97  CALCIUM 9.0   LFT  Recent Labs  05/18/16 0638  PROT 6.6  ALBUMIN 3.5  AST 20  ALT 13*  ALKPHOS 56  BILITOT 1.5*  BILIDIR <0.1*  IBILI NOT CALCULATED   PT/INR No results for input(s): LABPROT, INR in the last 72 hours.  STUDIES: No results found.    Impression / Plan:   Madison Lewis is a 46 y.o. y/o female with was admitted with weakness and a history of rectal bleeding  with fatigue. The patient was found to have a hemoglobin of 5.3. The patient does report that she has heavy menstrual bleeding. The patient was heme-negative from below. Despite this and due to the patient significantly low hemoglobin the patient will be set up for an EGD and colonoscopy. I have discussed risks & benefits which include, but are not limited to, bleeding, infection, perforation & drug reaction.  The patient agrees with this plan & written consent will be obtained.     Thank you for involving me in the care of this patient.      LOS: 1 day   [name], MD  05/18/2016, 1:22 PM   Note: This dictation was prepared with Dragon dictation along with smaller phrase technology. Any transcriptional errors that result from this process are unintentional.

## 2016-05-18 NOTE — Progress Notes (Signed)
Pt has completed bowel prep. Pt has had 4 bowel movement so far with an appearance of light tan.   Melinda Gwinner [place]

## 2016-05-18 NOTE — Progress Notes (Signed)
[facility] [place] [facility]   PATIENT NAME: Madison Lewis    MR#:  [id number]  DATE OF BIRTH:  11/23/1970  SUBJECTIVE:  CHIEF COMPLAINT:  Patient is resting comfortably. Denies abdominal pain. No other episodes of rectal bleeding. Denies any nausea vomiting. No similar complaint in the past.  REVIEW OF SYSTEMS:  CONSTITUTIONAL: No fever, fatigue or weakness.  EYES: No blurred or double vision.  EARS, NOSE, AND THROAT: No tinnitus or ear pain.  RESPIRATORY: No cough, shortness of breath, wheezing or hemoptysis.  CARDIOVASCULAR: No chest pain, orthopnea, edema.  GASTROINTESTINAL: No nausea, vomiting, diarrhea or abdominal pain.  GENITOURINARY: No dysuria, hematuria.  ENDOCRINE: No polyuria, nocturia,  HEMATOLOGY: No anemia, easy bruising or bleeding SKIN: No rash or lesion. MUSCULOSKELETAL: No joint pain or arthritis.   NEUROLOGIC: No tingling, numbness, weakness.  PSYCHIATRY: No anxiety or depression.   DRUG ALLERGIES:  No Known Allergies  VITALS:  Blood pressure (!) 167/93, pulse 73, temperature 98.3 F (36.8 C), temperature source Oral, resp. rate 20, height 5\' 4"  (1.626 m), weight 93.9 kg (207 lb), last menstrual period 04/26/2016, SpO2 99 %.  PHYSICAL EXAMINATION:  GENERAL:  46 y.o.-year-old patient lying in the bed with no acute distress.  EYES: Pupils equal, round, reactive to light and accommodation. No scleral icterus. Extraocular muscles intact.  HEENT: Head atraumatic, normocephalic. Oropharynx and nasopharynx clear.  NECK:  Supple, no jugular venous distention. No thyroid enlargement, no tenderness.  LUNGS: Normal breath sounds bilaterally, no wheezing, rales,rhonchi or crepitation. No use of accessory muscles of respiration.  CARDIOVASCULAR: S1, S2 normal. No murmurs, rubs, or gallops.  ABDOMEN: Soft, nontender, nondistended. Bowel sounds present. No organomegaly or mass.  EXTREMITIES: No pedal edema, cyanosis, or clubbing.   NEUROLOGIC: Cranial nerves II through XII are intact. Muscle strength 5/5 in all extremities. Sensation intact. Gait not checked.  PSYCHIATRIC: The patient is alert and oriented x 3.  SKIN: No obvious rash, lesion, or ulcer.    LABORATORY PANEL:   CBC  Recent Labs Lab 05/17/16 1924  05/18/16 1002  WBC 3.6  --   --   HGB 5.5*  < > 8.3*  HCT 19.1*  --  26.4*  PLT 263  --   --   < > = values in this interval not displayed. ------------------------------------------------------------------------------------------------------------------  Chemistries   Recent Labs Lab 05/17/16 1924 05/18/16 0638  NA 135  --   K 4.2  --   CL 106  --   CO2 24  --   GLUCOSE 100*  --   BUN 15  --   CREATININE 0.97  --   CALCIUM 9.0  --   AST  --  20  ALT  --  13*  ALKPHOS  --  56  BILITOT  --  1.5*   ------------------------------------------------------------------------------------------------------------------  Cardiac Enzymes No results for input(s): TROPONINI in the last 168 hours. ------------------------------------------------------------------------------------------------------------------  RADIOLOGY:  No results found.  EKG:   Orders placed or performed during the hospital encounter of 05/17/16  . ED EKG  . ED EKG    ASSESSMENT AND PLAN:   Symptomatic anemia. Secondary to rectal bleed. Status post 2 units of blood transfusion. Hemoglobin is 5.5-8.1-8.3   GI bleeding. Secondary to rectal bleed Monitor hemoglobin and hematocrit closely Protonix IV twice a day. Keep by mouth with IV fluid support.  GI has scheduled EGD and colonoscopy Follow-up with gastroenterology  Leg edema with alcohol abuse.  LFTs are normal except total bilirubin at  1.5. Deferred abdominal ultrasound as patient is asymptomatic. Monitor LFTs  Hypertension. Continue home hypertension medication.  Alcohol abuse.  CIWA protocol.  Obesity. LDL 87, triglycerides 100       All the  records are reviewed and case discussed with Care Management/Social Workerr. Management plans discussed with the patient, family and they are in agreement.  CODE STATUS: fc   TOTAL TIME TAKING CARE OF THIS PATIENT: 35  minutes.   POSSIBLE D/C IN 2  DAYS, DEPENDING ON CLINICAL CONDITION.  Note: This dictation was prepared with Dragon dictation along with smaller phrase technology. Any transcriptional errors that result from this process are unintentional.   Ramonita LabGouru, Alejandra Barna M.D on 05/18/2016 at 1:41 PM  Between 7am to 6pm - Pager - [phone] After 6pm go to [email or web address] - password [email or web address] [facility]  [facility]  Office  [phone]  CC: Primary care physician; Luvenia StarchSANTAYANA, GLORIA PATRICIA, DO

## 2016-05-18 NOTE — Progress Notes (Signed)
Pt reported having a brief episode of "tingling" in mid chest (epigastric) at the beginning of blood transfusion. The sensation went away after about 3-4 minutes (pt did not report this to me until 10 minutes after the event took place). MD notified. No new orders, continue blood transfusion as scheduled. Continue to monitor.

## 2016-05-19 ENCOUNTER — Encounter: Payer: Self-pay | Admitting: *Deleted

## 2016-05-19 ENCOUNTER — Inpatient Hospital Stay: Payer: BLUE CROSS/BLUE SHIELD | Admitting: Anesthesiology

## 2016-05-19 ENCOUNTER — Encounter
Admission: EM | Disposition: A | Discharge status: Home / Self Care | Payer: Self-pay | Source: Home / Self Care | Attending: Internal Medicine

## 2016-05-19 DIAGNOSIS — D509 Iron deficiency anemia, unspecified: Secondary | ICD-10-CM

## 2016-05-19 HISTORY — PX: COLONOSCOPY WITH PROPOFOL: SHX5780

## 2016-05-19 HISTORY — PX: ESOPHAGOGASTRODUODENOSCOPY (EGD) WITH PROPOFOL: SHX5813

## 2016-05-19 LAB — TYPE AND SCREEN
ABO/RH(D): B POS
ANTIBODY SCREEN: NEGATIVE
UNIT DIVISION: 0
UNIT DIVISION: 0

## 2016-05-19 LAB — BPAM RBC
BLOOD PRODUCT EXPIRATION DATE: 201805142359
Blood Product Expiration Date: 201805172359
ISSUE DATE / TIME: 201805030029
ISSUE DATE / TIME: 201805030308
UNIT TYPE AND RH: 7300
UNIT TYPE AND RH: 7300

## 2016-05-19 LAB — COMPREHENSIVE METABOLIC PANEL
ALBUMIN: 3.6 g/dL (ref 3.5–5.0)
ALK PHOS: 56 U/L (ref 38–126)
ALT: 14 U/L (ref 14–54)
ANION GAP: 5 (ref 5–15)
AST: 18 U/L (ref 15–41)
BILIRUBIN TOTAL: 0.8 mg/dL (ref 0.3–1.2)
BUN: 7 mg/dL (ref 6–20)
CALCIUM: 8.8 mg/dL — AB (ref 8.9–10.3)
CO2: 24 mmol/L (ref 22–32)
Chloride: 108 mmol/L (ref 101–111)
Creatinine, Ser: 0.74 mg/dL (ref 0.44–1.00)
GFR calc non Af Amer: >60 mL/min (ref >60)
GLUCOSE: 88 mg/dL (ref 65–99)
POTASSIUM: 3.9 mmol/L (ref 3.5–5.1)
SODIUM: 137 mmol/L (ref 135–145)
TOTAL PROTEIN: 6.8 g/dL (ref 6.5–8.1)

## 2016-05-19 LAB — CBC
HEMATOCRIT: 27.1 % — AB (ref 35.0–47.0)
HEMOGLOBIN: 8.6 g/dL — AB (ref 12.0–16.0)
MCH: 21.7 pg — ABNORMAL LOW (ref 26.0–34.0)
MCHC: 31.6 g/dL — ABNORMAL LOW (ref 32.0–36.0)
MCV: 68.7 fL — ABNORMAL LOW (ref 80.0–100.0)
Platelets: 161 10*3/uL (ref 150–440)
RBC: 3.94 MIL/uL (ref 3.80–5.20)
RDW: 35.6 % — ABNORMAL HIGH (ref 11.5–14.5)
WBC: 6.8 10*3/uL (ref 3.6–11.0)

## 2016-05-19 LAB — HIV ANTIBODY (ROUTINE TESTING W REFLEX): HIV SCREEN 4TH GENERATION: NONREACTIVE

## 2016-05-19 SURGERY — ESOPHAGOGASTRODUODENOSCOPY (EGD) WITH PROPOFOL
Anesthesia: General

## 2016-05-19 MED ORDER — PROPOFOL 500 MG/50ML IV EMUL
INTRAVENOUS | Status: DC | PRN
  Administered 2016-05-19: 160 ug/kg/min via INTRAVENOUS

## 2016-05-19 MED ORDER — THIAMINE HCL 100 MG PO TABS: 100.0000 mg | ORAL_TABLET | Freq: Every day | ORAL | Status: DC

## 2016-05-19 MED ORDER — PROPOFOL 10 MG/ML IV BOLUS
INTRAVENOUS | Status: DC | PRN
  Administered 2016-05-19: 100 mg via INTRAVENOUS

## 2016-05-19 MED ORDER — FENTANYL CITRATE (PF) 100 MCG/2ML IJ SOLN
INTRAMUSCULAR | Status: AC
  Filled 2016-05-19: qty 2

## 2016-05-19 MED ORDER — FERROUS SULFATE 325 (65 FE) MG PO TABS: 325.0000 mg | ORAL_TABLET | Freq: Two times a day (BID) | ORAL | 1 refills | Status: DC

## 2016-05-19 MED ORDER — FENTANYL CITRATE (PF) 100 MCG/2ML IJ SOLN
INTRAMUSCULAR | Status: DC | PRN
  Administered 2016-05-19: 25 ug via INTRAVENOUS
  Administered 2016-05-19: 50 ug via INTRAVENOUS
  Administered 2016-05-19: 25 ug via INTRAVENOUS

## 2016-05-19 MED ORDER — SEVOFLURANE IN SOLN
RESPIRATORY_TRACT | Status: AC
  Filled 2016-05-19: qty 250

## 2016-05-19 MED ORDER — PROPOFOL 500 MG/50ML IV EMUL
INTRAVENOUS | Status: AC
  Filled 2016-05-19: qty 50

## 2016-05-19 MED ORDER — DOCUSATE SODIUM 100 MG PO CAPS: 100.0000 mg | ORAL_CAPSULE | Freq: Two times a day (BID) | ORAL | 0 refills | Status: DC

## 2016-05-19 MED ORDER — ADULT MULTIVITAMIN W/MINERALS CH: 1.0000 | ORAL_TABLET | Freq: Every day | ORAL | Status: AC

## 2016-05-19 MED ORDER — FOLIC ACID 1 MG PO TABS: 1.0000 mg | ORAL_TABLET | Freq: Every day | ORAL | Status: DC

## 2016-05-19 MED ORDER — SODIUM CHLORIDE 0.9 % IV SOLN
INTRAVENOUS | Status: DC
  Administered 2016-05-19: 11:00:00 via INTRAVENOUS

## 2016-05-19 MED ORDER — ACETAMINOPHEN 325 MG PO TABS: 650.0000 mg | ORAL_TABLET | Freq: Four times a day (QID) | ORAL | Status: DC | PRN

## 2016-05-19 NOTE — Progress Notes (Signed)
Patient has no acute event overnight. PRN labetalol administered for SBP>160. On room air, denied pain, CIWA is negative.  NPO overnight.

## 2016-05-19 NOTE — Op Note (Signed)
[facility] Gastroenterology Patient Name: Madison Lewis Procedure Date: 05/19/2016 11:05 AM MRN: [id number] Account #: [id number] Date of Birth: 1970/01/27 Admit Type: Inpatient Age: 46 Room: [facility] ENDO ROOM 4 Gender: Female Note Status: Finalized Procedure:            Upper GI endoscopy Indications:          Iron deficiency anemia Providers:            Midge Minium MD, MD Referring MD:         Leretha Dykes. Santayana (Referring MD) Medicines:            Propofol per Anesthesia Complications:        No immediate complications. Procedure:            Pre-Anesthesia Assessment:                       - Prior to the procedure, a History and Physical was                        performed, and patient medications and allergies were                        reviewed. The patient's tolerance of previous                        anesthesia was also reviewed. The risks and benefits of                        the procedure and the sedation options and risks were                        discussed with the patient. All questions were                        answered, and informed consent was obtained. Prior                        Anticoagulants: The patient has taken no previous                        anticoagulant or antiplatelet agents. ASA Grade                        Assessment: II - A patient with mild systemic disease.                        After reviewing the risks and benefits, the patient was                        deemed in satisfactory condition to undergo the                        procedure.                       After obtaining informed consent, the endoscope was                        passed under direct vision. Throughout the procedure,  the patient's blood pressure, pulse, and oxygen                        saturations were monitored continuously. The Endoscope                        was introduced through the mouth, and advanced to the             second part of duodenum. The upper GI endoscopy was                        accomplished without difficulty. The patient tolerated                        the procedure well. Findings:      A large hiatal hernia was present.      The stomach was normal.      The examined duodenum was normal. Impression:           - Large hiatal hernia.                       - Normal stomach.                       - Normal examined duodenum.                       - No specimens collected. Recommendation:       - Discharge patient to home.                       - Resume previous diet.                       - Continue present medications. Procedure Code(s):    --- Professional ---                       [phone], Esophagogastroduodenoscopy, flexible, transoral;                        diagnostic, including collection of specimen(s) by                        brushing or washing, when performed (separate procedure) Diagnosis Code(s):    --- Professional ---                       D50.9, Iron deficiency anemia, unspecified CPT copyright 2016 American Medical Association. All rights reserved. The codes documented in this report are preliminary and upon coder review may  be revised to meet current compliance requirements. Midge Miniumarren Corie Vavra MD, MD 05/19/2016 11:13:30 AM This report has been signed electronically. Number of Addenda: 0 Note Initiated On: 05/19/2016 11:05 AM      [place] [facility]

## 2016-05-19 NOTE — Anesthesia Preprocedure Evaluation (Signed)
Anesthesia Evaluation  Patient identified by MRN, date of birth, ID band Patient awake    Reviewed: Allergy & Precautions, NPO status , Patient's Chart, lab work & pertinent test results, reviewed documented beta blocker date and time   Airway Mallampati: III  TM Distance: <3 FB     Dental  (+) Chipped   Pulmonary neg pulmonary ROS,    Pulmonary exam normal        Cardiovascular hypertension, Pt. on medications and Pt. on home beta blockers Normal cardiovascular exam     Neuro/Psych negative neurological ROS  negative psych ROS   GI/Hepatic negative GI ROS, Neg liver ROS,   Endo/Other  negative endocrine ROS  Renal/GU negative Renal ROS  negative genitourinary   Musculoskeletal negative musculoskeletal ROS (+)   Abdominal (+) + obese,   Peds negative pediatric ROS (+)  Hematology  (+) anemia ,   Anesthesia Other Findings   Reproductive/Obstetrics                             Anesthesia Physical Anesthesia Plan  ASA: II  Anesthesia Plan: General   Post-op Pain Management:    Induction: Intravenous  Airway Management Planned: Nasal Cannula  Additional Equipment:   Intra-op Plan:   Post-operative Plan:   Informed Consent: I have reviewed the patients History and Physical, chart, labs and discussed the procedure including the risks, benefits and alternatives for the proposed anesthesia with the patient or authorized representative who has indicated his/her understanding and acceptance.   Dental advisory given  Plan Discussed with: CRNA and Surgeon  Anesthesia Plan Comments:         Anesthesia Quick Evaluation

## 2016-05-19 NOTE — Transfer of Care (Signed)
Immediate Anesthesia Transfer of Care Note  Patient: Madison Lewis  Procedure(s) Performed: Procedure(s): ESOPHAGOGASTRODUODENOSCOPY (EGD) WITH PROPOFOL (N/A) COLONOSCOPY WITH PROPOFOL (N/A)  Patient Location: PACU and Endoscopy Unit  Anesthesia Type:General  Level of Consciousness: awake, alert  and oriented  Airway & Oxygen Therapy: Patient Spontanous Breathing and Patient connected to nasal cannula oxygen  Post-op Assessment: Report given to RN and Post -op Vital signs reviewed and stable  Post vital signs: Reviewed and stable  Last Vitals:  Vitals:   05/19/16 1030 05/19/16 1136  BP: (!) 148/86 137/86  Pulse: 78 90  Resp: 14   Temp: 36.8 C 37 C    Last Pain:  Vitals:   05/19/16 1136  TempSrc: Tympanic  PainSc:          Complications: No apparent anesthesia complications

## 2016-05-19 NOTE — Anesthesia Post-op Follow-up Note (Cosign Needed)
Anesthesia QCDR form completed.        

## 2016-05-19 NOTE — Anesthesia Postprocedure Evaluation (Signed)
Anesthesia Post Note  Patient: Madison Lewis  Procedure(s) Performed: Procedure(s) (LRB): ESOPHAGOGASTRODUODENOSCOPY (EGD) WITH PROPOFOL (N/A) COLONOSCOPY WITH PROPOFOL (N/A)  Patient location during evaluation: PACU Anesthesia Type: General Level of consciousness: awake and alert and oriented Pain management: pain level controlled Vital Signs Assessment: post-procedure vital signs reviewed and stable Respiratory status: spontaneous breathing Cardiovascular status: blood pressure returned to baseline Anesthetic complications: no     Last Vitals:  Vitals:   05/19/16 1136 05/19/16 1146  BP: 138/73 137/82  Pulse: 89 84  Resp: (!) 21 17  Temp: 37 C     Last Pain:  Vitals:   05/19/16 1136  TempSrc: Tympanic  PainSc:                  Galileo Colello

## 2016-05-19 NOTE — Op Note (Signed)
[facility] Gastroenterology Patient Name: Madison SonsShaneisaka Bierly Procedure Date: 05/19/2016 11:04 AM MRN: [id number] Account #: [id number] Date of Birth: 11/09/1970 Admit Type: Inpatient Age: [age] [facility] 4 Gender: Female Note Status: Finalized Procedure:            Colonoscopy Indications:          Hematochezia Providers:            Midge Miniumarren Orlan Aversa MD, MD Referring MD:         Leretha DykesGloria P. Santayana (Referring MD) Medicines:            Propofol per Anesthesia Complications:        No immediate complications. Procedure:            Pre-Anesthesia Assessment:                       - Prior to the procedure, a History and Physical was                        performed, and patient medications and allergies were                        reviewed. The patient's tolerance of previous                        anesthesia was also reviewed. The risks and benefits of                        the procedure and the sedation options and risks were                        discussed with the patient. All questions were                        answered, and informed consent was obtained. Prior                        Anticoagulants: The patient has taken no previous                        anticoagulant or antiplatelet agents. ASA Grade                        Assessment: II - A patient with mild systemic disease.                        After reviewing the risks and benefits, the patient was                        deemed in satisfactory condition to undergo the                        procedure.                       After obtaining informed consent, the colonoscope was                        passed under direct vision. Throughout the procedure,  the patient's blood pressure, pulse, and oxygen                        saturations were monitored continuously. The                        Colonoscope was introduced through the anus and                        advanced to the the  cecum, identified by appendiceal                        orifice and ileocecal valve. The colonoscopy was                        performed without difficulty. The patient tolerated the                        procedure well. The quality of the bowel preparation                        was good. Findings:      The perianal and digital rectal examinations were normal.      Non-bleeding internal hemorrhoids were found during retroflexion. The       hemorrhoids were Grade II (internal hemorrhoids that prolapse but reduce       spontaneously). Impression:           - Non-bleeding internal hemorrhoids.                       - No specimens collected. Recommendation:       - Resume previous diet.                       - Continue present medications.                       - Return patient to hospital ward for ongoing care. Midge Minium MD, MD 05/19/2016 11:32:06 AM This report has been signed electronically. Number of Addenda: 0 Note Initiated On: 05/19/2016 11:04 AM Scope Withdrawal Time: 0 hours 5 minutes 40 seconds  Total Procedure Duration: 0 hours 11 minutes 59 seconds       [facility]

## 2016-05-19 NOTE — Progress Notes (Signed)
05/19/2016 5:18 PM  Madison Lewis Madison Lewis to be D/C'd Home per MD order.  Discussed prescriptions and follow up appointments with the patient. Prescriptions given to patient, medication list explained in detail. Pt verbalized understanding.  Allergies as of 05/19/2016   No Known Allergies     Medication List    TAKE these medications   acetaminophen 325 MG tablet Commonly known as:  TYLENOL Take 2 tablets (650 mg total) by mouth every 6 (six) hours as needed for mild pain (or Fever >/= 101).   docusate sodium 100 MG capsule Commonly known as:  COLACE Take 1 capsule (100 mg total) by mouth 2 (two) times daily.   ferrous sulfate 325 (65 FE) MG tablet Commonly known as:  FERROUSUL Take 1 tablet (325 mg total) by mouth 2 (two) times daily with a meal.   folic acid 1 MG tablet Commonly known as:  FOLVITE Take 1 tablet (1 mg total) by mouth daily. Start taking on:  05/20/2016   losartan-hydrochlorothiazide 100-25 MG tablet Commonly known as:  HYZAAR Take 1 tablet by mouth daily.   metoprolol tartrate 25 MG tablet Commonly known as:  LOPRESSOR Take 1 tablet by mouth 2 (two) times daily.   multivitamin with minerals Tabs tablet Take 1 tablet by mouth daily. Start taking on:  05/20/2016   thiamine 100 MG tablet Take 1 tablet (100 mg total) by mouth daily. Start taking on:  05/20/2016       Vitals:   05/19/16 1206 05/19/16 1240  BP: (!) 158/92 (!) 150/89  Pulse: 73 73  Resp: 15 20  Temp:  98 F (36.7 C)    Skin clean, dry and intact without evidence of skin break down, no evidence of skin tears noted. IV catheter discontinued intact. Site without signs and symptoms of complications. Dressing and pressure applied. Pt denies pain at this time. No complaints noted.  An After Visit Summary was printed and given to the patient. Patient escorted via WC, and D/C home via private auto.  Bradly Chrisougherty, Marquie Aderhold E

## 2016-05-19 NOTE — Discharge Summary (Signed)
[facility] - [place] at [facility]   PATIENT NAME: Madison Lewis    MR#:  [id number]  DATE OF BIRTH:  [birth year]  DATE OF ADMISSION:  05/17/2016 ADMITTING PHYSICIAN: Shaune Pollack, MD  DATE OF DISCHARGE: 05/19/16 PRIMARY CARE PHYSICIAN: Luvenia Starch PATRICIA, DO    ADMISSION DIAGNOSIS:  Symptomatic anemia [D64.9]  DISCHARGE DIAGNOSIS:  Active Problems:   Symptomatic anemia   Hematochezia   Iron deficiency anemia Internal hemorrhoids-colonoscopy Hiatal hernia on EGD  SECONDARY DIAGNOSIS:   Past Medical History:  Diagnosis Date  . Hypertension     HOSPITAL COURSE:  hpi Madison Lewis  is a 46 y.o. female with a known history of Hypertension. She presents to the ED with the above chief complaints. She said she has had rectal bleeding on and off for the past 2 months. The last rectal bleeding was 2 weeks ago. She has had worsening fatigue and generalized weakness. She also complains of dizziness and dyspnea on exertion. She made an appointment with her primary care doctor today and had labs drawn showing a hemoglobin of 5.3, so the patient was told to go to the emergency department.  But the stool occult is negative. She said she drinks beer one can every other day for the past 2 years.  Hospital course  Symptomatic anemia. Secondary to rectal bleed. Status post 2 units of blood transfusion. Hemoglobin is 5.5-8.1-8.3-8.6 Patient had a colonoscopy today which has revealed nonbleeding internal hemorrhoids and EGD has revealed hiatal hernia. No other episodes of bleeding. Will discharge patient with stool softener and iron supplements. Outpatient follow-up with gastroenterology in 1 month. Outpatient follow-up with gynecology is also recommended   GI bleeding. Secondary to rectal bleed Monitored hemoglobin and hematocrit closely Protonix IV twice a day given  GI has scheduled EGD and colonoscopy, the results and plan of care as mentioned  above Follow-up with gastroenterology outpatient in 1 month  Leg edema with alcohol abuse.  LFTs are normal except total bilirubin at 1.5. Deferred abdominal ultrasound as patient is asymptomatic. Repeat bilirubin 0.8   Hypertension. Continue home hypertension medication.  Alcohol abuse.  CIWA protocol. Counseled patient to stop drinking alcohol and outpatient follow-up with alcohol anonymous  Obesity. LDL 87, triglycerides 100   DISCHARGE CONDITIONS:   stable  CONSULTS OBTAINED:  Treatment Team:  Midge Minium, MD   PROCEDURES   Internal hemorrhoids-colonoscopy Hiatal hernia on EGD    DRUG ALLERGIES:  No Known Allergies  DISCHARGE MEDICATIONS:   Current Discharge Medication List    START taking these medications   Details  acetaminophen (TYLENOL) 325 MG tablet Take 2 tablets (650 mg total) by mouth every 6 (six) hours as needed for mild pain (or Fever >/= 101).    docusate sodium (COLACE) 100 MG capsule Take 1 capsule (100 mg total) by mouth 2 (two) times daily. Qty: 10 capsule, Refills: 0    ferrous sulfate (FERROUSUL) 325 (65 FE) MG tablet Take 1 tablet (325 mg total) by mouth 2 (two) times daily with a meal. Qty: 60 tablet, Refills: 1    folic acid (FOLVITE) 1 MG tablet Take 1 tablet (1 mg total) by mouth daily.    Multiple Vitamin (MULTIVITAMIN WITH MINERALS) TABS tablet Take 1 tablet by mouth daily.    thiamine 100 MG tablet Take 1 tablet (100 mg total) by mouth daily.      CONTINUE these medications which have NOT CHANGED   Details  losartan-hydrochlorothiazide (HYZAAR) 100-25 MG tablet Take 1 tablet by  mouth daily.    metoprolol tartrate (LOPRESSOR) 25 MG tablet Take 1 tablet by mouth 2 (two) times daily.         DISCHARGE INSTRUCTIONS:   Follow-up with primary care physician in a week Follow-up with gastroenterology in a month Follow-up with gynecology in 2-4 weeks Outpatient follow-up with alcohol anonymous   DIET:  Cardiac  diet  DISCHARGE CONDITION:  Stable  ACTIVITY:  Activity as tolerated  OXYGEN:  Home Oxygen: No.   Oxygen Delivery: room air  DISCHARGE LOCATION:  home   If you experience worsening of your admission symptoms, develop shortness of breath, life threatening emergency, suicidal or homicidal thoughts you must seek medical attention immediately by calling 911 or calling your MD immediately  if symptoms less severe.  You Must read complete instructions/literature along with all the possible adverse reactions/side effects for all the Medicines you take and that have been prescribed to you. Take any new Medicines after you have completely understood and accpet all the possible adverse reactions/side effects.   Please note  You were cared for by a hospitalist during your hospital stay. If you have any questions about your discharge medications or the care you received while you were in the hospital after you are discharged, you can call the unit and asked to speak with the hospitalist on call if the hospitalist that took care of you is not available. Once you are discharged, your primary care physician will handle any further medical issues. Please note that NO REFILLS for any discharge medications will be authorized once you are discharged, as it is imperative that you return to your primary care physician (or establish a relationship with a primary care physician if you do not have one) for your aftercare needs so that they can reassess your need for medications and monitor your lab values.     Today  Chief Complaint  Patient presents with  . Anemia  . Dizziness    Patient has no other episodes of rectal bleed. Had EGD and colonoscopy which has revealed nonbleeding internal hemorrhoids. Okay to discharge patient from Gastro ROS:  CONSTITUTIONAL: Denies fevers, chills. Denies any fatigue, weakness.  EYES: Denies blurry vision, double vision, eye pain. EARS, NOSE, THROAT: Denies tinnitus,  ear pain, hearing loss. RESPIRATORY: Denies cough, wheeze, shortness of breath.  CARDIOVASCULAR: Denies chest pain, palpitations, edema.  GASTROINTESTINAL: Denies nausea, vomiting, diarrhea, abdominal pain. Denies bright red blood per rectum. GENITOURINARY: Denies dysuria, hematuria. ENDOCRINE: Denies nocturia or thyroid problems. HEMATOLOGIC AND LYMPHATIC: Denies easy bruising or bleeding. SKIN: Denies rash or lesion. MUSCULOSKELETAL: Denies pain in neck, back, shoulder, knees, hips or arthritic symptoms.  NEUROLOGIC: Denies paralysis, paresthesias.  PSYCHIATRIC: Denies anxiety or depressive symptoms.   VITAL SIGNS:  Blood pressure (!) 150/89, pulse 73, temperature 98 F (36.7 C), temperature source Oral, resp. rate 20, height 5\' 4"  (1.626 m), weight 93.9 kg (207 lb), last menstrual period 04/26/2016, SpO2 100 %.  I/O:    Intake/Output Summary (Last 24 hours) at 05/19/16 1555 Last data filed at 05/19/16 1131  Gross per 24 hour  Intake           743.75 ml  Output                0 ml  Net           743.75 ml    PHYSICAL EXAMINATION:  GENERAL:  46 y.o.-year-old patient lying in the bed with no acute distress.  EYES: Pupils equal, round, reactive  to light and accommodation. No scleral icterus. Extraocular muscles intact.  HEENT: Head atraumatic, normocephalic. Oropharynx and nasopharynx clear.  NECK:  Supple, no jugular venous distention. No thyroid enlargement, no tenderness.  LUNGS: Normal breath sounds bilaterally, no wheezing, rales,rhonchi or crepitation. No use of accessory muscles of respiration.  CARDIOVASCULAR: S1, S2 normal. No murmurs, rubs, or gallops.  ABDOMEN: Soft, non-tender, non-distended. Bowel sounds present. No organomegaly or mass.  EXTREMITIES: No pedal edema, cyanosis, or clubbing.  NEUROLOGIC: Cranial nerves II through XII are intact. Muscle strength 5/5 in all extremities. Sensation intact. Gait not checked.  PSYCHIATRIC: The patient is alert and oriented x  3.  SKIN: No obvious rash, lesion, or ulcer.   DATA REVIEW:   CBC  Recent Labs Lab 05/19/16 0555  WBC 6.8  HGB 8.6*  HCT 27.1*  PLT 161    Chemistries   Recent Labs Lab 05/19/16 0555  NA 137  K 3.9  CL 108  CO2 24  GLUCOSE 88  BUN 7  CREATININE 0.74  CALCIUM 8.8*  AST 18  ALT 14  ALKPHOS 56  BILITOT 0.8    Cardiac Enzymes No results for input(s): TROPONINI in the last 168 hours.  Microbiology Results  Results for orders placed or performed during the hospital encounter of 01/05/15  Rapid strep screen     Status: None   Collection Time: 01/05/15  3:56 PM  Result Value Ref Range Status   Streptococcus, Group A Screen (Direct) NEGATIVE NEGATIVE Final  Culture, group A strep ([facility] only)     Status: None   Collection Time: 01/05/15  3:56 PM  Result Value Ref Range Status   Specimen Description THROAT  Final   Special Requests NONE  Final   Culture   Final    HEAVY GROWTH STREPTOCOCCUS GROUP C There is no known Penicillin Resistant Beta Streptococcus in the [place] For patients that are Penicillin-allergic, Erythromycin is 85-94% susceptible, and Clindamycin is 80% susceptible.  Contact Microbiology within 7 days if sensitivity testing is  required.      Report Status 01/08/2015 FINAL  Final    RADIOLOGY:  No results found.  EKG:   Orders placed or performed during the hospital encounter of 05/17/16  . ED EKG  . ED EKG      Management plans discussed with the patient, family and they are in agreement.  CODE STATUS:     Code Status Orders        Start     Ordered   05/17/16 2303  Full code  Continuous     05/17/16 2302    Code Status History    Date Active Date Inactive Code Status Order ID Comments User Context   This patient has a current code status but no historical code status.      TOTAL TIME TAKING CARE OF THIS PATIENT: 45 minutes.   Note: This dictation was prepared with Dragon dictation along with smaller phrase technology. Any  transcriptional errors that result from this process are unintentional.   @M[facility] @  on 05/19/2016 at 3:55 PM  Between 7am to 6pm - Pager - [phone]  After 6pm go to [email or web address] - password [email or web address] [facility]  [facility]  Office  [phone]  CC: Primary care physician; Luvenia StarchSANTAYANA, GLORIA PATRICIA, DO

## 2016-05-19 NOTE — Discharge Instructions (Signed)
Follow-up with primary care physician in a week Follow-up with gastroenterology in a month Follow-up with gynecology in 2-4 weeks

## 2016-05-22 ENCOUNTER — Encounter: Payer: Self-pay | Admitting: Gastroenterology

## 2016-05-25 ENCOUNTER — Other Ambulatory Visit: Payer: Self-pay | Admitting: Family Medicine

## 2016-05-25 DIAGNOSIS — D509 Iron deficiency anemia, unspecified: Secondary | ICD-10-CM

## 2016-05-25 DIAGNOSIS — N920 Excessive and frequent menstruation with regular cycle: Secondary | ICD-10-CM

## 2016-06-05 ENCOUNTER — Ambulatory Visit: Payer: BLUE CROSS/BLUE SHIELD

## 2016-06-07 ENCOUNTER — Other Ambulatory Visit: Payer: Self-pay | Admitting: Family Medicine

## 2016-06-07 DIAGNOSIS — Z1231 Encounter for screening mammogram for malignant neoplasm of breast: Secondary | ICD-10-CM

## 2016-06-13 ENCOUNTER — Ambulatory Visit
Admission: RE | Admit: 2016-06-13 | Discharge: 2016-06-13 | Disposition: A | Discharge status: Home / Self Care | Payer: BLUE CROSS/BLUE SHIELD | Source: Ambulatory Visit | Attending: Family Medicine | Admitting: Family Medicine

## 2016-06-13 DIAGNOSIS — Z1231 Encounter for screening mammogram for malignant neoplasm of breast: Secondary | ICD-10-CM

## 2016-06-19 ENCOUNTER — Other Ambulatory Visit: Payer: Self-pay

## 2016-06-19 ENCOUNTER — Encounter: Payer: Self-pay | Admitting: Gastroenterology

## 2016-06-19 ENCOUNTER — Ambulatory Visit: Payer: BLUE CROSS/BLUE SHIELD | Admitting: Gastroenterology

## 2018-06-14 ENCOUNTER — Encounter: Payer: Self-pay | Admitting: Emergency Medicine

## 2018-06-14 ENCOUNTER — Other Ambulatory Visit: Payer: Self-pay

## 2018-06-14 ENCOUNTER — Inpatient Hospital Stay
Admission: EM | Admit: 2018-06-14 | Discharge: 2018-06-16 | DRG: 812 | Disposition: A | Discharge status: Home / Self Care | Payer: BLUE CROSS/BLUE SHIELD | Attending: Internal Medicine | Admitting: Internal Medicine

## 2018-06-14 DIAGNOSIS — D649 Anemia, unspecified: Secondary | ICD-10-CM | POA: Diagnosis present

## 2018-06-14 DIAGNOSIS — Z79899 Other long term (current) drug therapy: Secondary | ICD-10-CM | POA: Diagnosis not present

## 2018-06-14 DIAGNOSIS — Z1159 Encounter for screening for other viral diseases: Secondary | ICD-10-CM

## 2018-06-14 DIAGNOSIS — N920 Excessive and frequent menstruation with regular cycle: Secondary | ICD-10-CM | POA: Diagnosis present

## 2018-06-14 DIAGNOSIS — D500 Iron deficiency anemia secondary to blood loss (chronic): Secondary | ICD-10-CM | POA: Diagnosis present

## 2018-06-14 DIAGNOSIS — I10 Essential (primary) hypertension: Secondary | ICD-10-CM | POA: Diagnosis present

## 2018-06-14 LAB — CBC
HCT: 20.9 % — ABNORMAL LOW (ref 36.0–46.0)
Hemoglobin: 5.5 g/dL — ABNORMAL LOW (ref 12.0–15.0)
MCH: 18.6 pg — ABNORMAL LOW (ref 26.0–34.0)
MCHC: 26.3 g/dL — ABNORMAL LOW (ref 30.0–36.0)
MCV: 70.8 fL — ABNORMAL LOW (ref 80.0–100.0)
Platelets: 869 10*3/uL — ABNORMAL HIGH (ref 150–400)
RBC: 2.95 MIL/uL — ABNORMAL LOW (ref 3.87–5.11)
RDW: 30 % — ABNORMAL HIGH (ref 11.5–15.5)
WBC: 8 10*3/uL (ref 4.0–10.5)
nRBC: 2.3 % — ABNORMAL HIGH (ref 0.0–0.2)

## 2018-06-14 LAB — PROTIME-INR
INR: 1.1 (ref 0.8–1.2)
Prothrombin Time: 13.7 seconds (ref 11.4–15.2)

## 2018-06-14 LAB — COMPREHENSIVE METABOLIC PANEL
ALT: 15 U/L (ref 0–44)
AST: 20 U/L (ref 15–41)
Albumin: 3.8 g/dL (ref 3.5–5.0)
Alkaline Phosphatase: 68 U/L (ref 38–126)
Anion gap: 7 (ref 5–15)
BUN: 9 mg/dL (ref 6–20)
CO2: 22 mmol/L (ref 22–32)
Calcium: 8.3 mg/dL — ABNORMAL LOW (ref 8.9–10.3)
Chloride: 106 mmol/L (ref 98–111)
Creatinine, Ser: 0.93 mg/dL (ref 0.44–1.00)
GFR calc Af Amer: >60 mL/min (ref >60)
GFR calc non Af Amer: >60 mL/min (ref >60)
Glucose, Bld: 96 mg/dL (ref 70–99)
Potassium: 3.9 mmol/L (ref 3.5–5.1)
Sodium: 135 mmol/L (ref 135–145)
Total Bilirubin: 0.4 mg/dL (ref 0.3–1.2)
Total Protein: 7.3 g/dL (ref 6.5–8.1)

## 2018-06-14 LAB — POCT PREGNANCY, URINE: Preg Test, Ur: NEGATIVE

## 2018-06-14 LAB — PREPARE RBC (CROSSMATCH)

## 2018-06-14 LAB — SARS CORONAVIRUS 2 BY RT PCR (HOSPITAL ORDER, PERFORMED IN ~~LOC~~ HOSPITAL LAB): SARS Coronavirus 2: NEGATIVE

## 2018-06-14 MED ORDER — METOPROLOL SUCCINATE ER 100 MG PO TB24
100.0000 mg | ORAL_TABLET | Freq: Every day | ORAL | Status: DC
  Administered 2018-06-15 – 2018-06-16 (×2): 100 mg via ORAL
  Filled 2018-06-14 (×2): qty 1

## 2018-06-14 MED ORDER — LOSARTAN POTASSIUM 50 MG PO TABS
100.0000 mg | ORAL_TABLET | Freq: Every day | ORAL | Status: DC
  Administered 2018-06-15 – 2018-06-16 (×2): 100 mg via ORAL
  Filled 2018-06-14 (×2): qty 2

## 2018-06-14 MED ORDER — ACETAMINOPHEN 650 MG RE SUPP: 650.0000 mg | Freq: Four times a day (QID) | RECTAL | Status: DC | PRN

## 2018-06-14 MED ORDER — ONDANSETRON HCL 4 MG/2ML IJ SOLN: 4.0000 mg | Freq: Four times a day (QID) | INTRAMUSCULAR | Status: DC | PRN

## 2018-06-14 MED ORDER — ACETAMINOPHEN 325 MG PO TABS
650.0000 mg | ORAL_TABLET | Freq: Four times a day (QID) | ORAL | Status: DC | PRN
  Administered 2018-06-15 (×2): 650 mg via ORAL
  Filled 2018-06-14 (×2): qty 2

## 2018-06-14 MED ORDER — LABETALOL HCL 5 MG/ML IV SOLN
10.0000 mg | INTRAVENOUS | Status: DC | PRN
  Administered 2018-06-15 – 2018-06-16 (×6): 10 mg via INTRAVENOUS
  Filled 2018-06-14 (×6): qty 4

## 2018-06-14 MED ORDER — PANTOPRAZOLE SODIUM 40 MG IV SOLR
40.0000 mg | Freq: Two times a day (BID) | INTRAVENOUS | Status: DC
  Administered 2018-06-14 – 2018-06-16 (×4): 40 mg via INTRAVENOUS
  Filled 2018-06-14 (×4): qty 40

## 2018-06-14 MED ORDER — SODIUM CHLORIDE 0.9 % IV SOLN
10.0000 mL/h | Freq: Once | INTRAVENOUS | Status: AC
  Administered 2018-06-15: 10 mL/h via INTRAVENOUS

## 2018-06-14 MED ORDER — ONDANSETRON HCL 4 MG PO TABS: 4.0000 mg | ORAL_TABLET | Freq: Four times a day (QID) | ORAL | Status: DC | PRN

## 2018-06-14 NOTE — ED Provider Notes (Addendum)
[facility] Emergency Department Provider Note   ____________________________________________   I have reviewed the triage vital signs and the nursing notes.   HISTORY  Chief Complaint Abnormal Lab   History limited by: Not Limited   HPI Madison Lewis is a 48 y.o. female who presents to the emergency department today because of concerns for low hemoglobin.  She states that she had a blood work checked today at primary care doctor's office because of concerns for feeling sluggish.  She states she has felt this way over the past couple of weeks.  She has been significantly anemic and is required blood transfusions roughly 2 years ago.  At that time she stated that they thought it was secondary to heavy menstrual bleeding although she states that recently her menstrual cycle has not been particularly heavy.  She did notice some blood in her stool 3 weeks ago but has not noticed any since.  Patient has also had some associated shortness of breath.  Records reviewed. Per medical record review patient has a history of HTN.   Past Medical History:  Diagnosis Date  . Hypertension     Patient Active Problem List   Diagnosis Date Noted  . Iron deficiency anemia   . Hematochezia   . Symptomatic anemia 05/17/2016  . Prediabetes 03/24/2015  . Chronic tension-type headache, not intractable 03/18/2015  . 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 Essential hypertension 03/18/2015    Past Surgical History:  Procedure Laterality Date  . CESAREAN SECTION    . COLONOSCOPY WITH PROPOFOL N/A 05/19/2016   Procedure: COLONOSCOPY WITH PROPOFOL;  Surgeon: Midge Minium, MD;  Location: [facility] ENDOSCOPY;  Service: Endoscopy;  Laterality: N/A;  . ESOPHAGOGASTRODUODENOSCOPY (EGD) WITH PROPOFOL N/A 05/19/2016   Procedure: ESOPHAGOGASTRODUODENOSCOPY (EGD) WITH PROPOFOL;  Surgeon: Midge Minium, MD;  Location: [facility] ENDOSCOPY;  Service: Endoscopy;  Laterality: N/A;  . KNEE SURGERY  Bilateral     Prior to Admission medications   Medication Sig Start Date End Date Taking? Authorizing Provider  acetaminophen (TYLENOL) 325 MG tablet Take 2 tablets (650 mg total) by mouth every 6 (six) hours as needed for mild pain (or Fever >/= 101). 05/19/16   Ramonita Lab, MD  docusate sodium (COLACE) 100 MG capsule Take 1 capsule (100 mg total) by mouth 2 (two) times daily. 05/19/16   Ramonita Lab, MD  ferrous sulfate (FERROUSUL) 325 (65 FE) MG tablet Take 1 tablet (325 mg total) by mouth 2 (two) times daily with a meal. 05/19/16   Gouru, Aruna, MD  folic acid (FOLVITE) 1 MG tablet Take 1 tablet (1 mg total) by mouth daily. 05/20/16   Gouru, Deanna Artis, MD  losartan-hydrochlorothiazide (HYZAAR) 100-25 MG tablet Take 1 tablet by mouth daily.    [provider]  metoprolol tartrate (LOPRESSOR) 25 MG tablet Take 1 tablet by mouth 2 (two) times daily. 05/16/16   [provider]  Multiple Vitamin (MULTIVITAMIN WITH MINERALS) TABS tablet Take 1 tablet by mouth daily. 05/20/16   Ramonita Lab, MD  thiamine 100 MG tablet Take 1 tablet (100 mg total) by mouth daily. 05/20/16   Ramonita Lab, MD  tranexamic acid (LYSTEDA) 650 MG TABS tablet  06/14/16   [provider]    Allergies Patient has no known allergies.  Family History  Problem Relation Age of Onset  . Hypertension Father   . Breast cancer Neg Hx     Social History Social History   Tobacco Use  . Smoking status: Never Smoker  . Smokeless  tobacco: Never Used  Substance Use Topics  . Alcohol use: Yes    Alcohol/week: 1.0 standard drinks    Types: 1 Cans of beer per week  . Drug use: No    Review of Systems Constitutional: No fever/chills Eyes: No visual changes. ENT: No sore throat. Cardiovascular: Denies chest pain. Respiratory: Positive for shortness of breath. Gastrointestinal: No abdominal pain.  No nausea, no vomiting.  No diarrhea.   Genitourinary: Negative for dysuria. Musculoskeletal: Negative for back  pain. Skin: Negative for rash. Neurological: Negative for headaches, focal weakness or numbness.  ____________________________________________   PHYSICAL EXAM:  VITAL SIGNS: ED Triage Vitals  Enc Vitals Group     BP 06/14/18 1358 (!) 168/88     Pulse Rate 06/14/18 1358 97     Resp 06/14/18 1358 20     Temp 06/14/18 1358 98.7 F (37.1 C)     Temp Source 06/14/18 1358 Oral     SpO2 06/14/18 1358 100 %     Weight 06/14/18 1359 220 lb (99.8 kg)     Height 06/14/18 1359 5\' 4"  (1.626 m)     Head Circumference --      Peak Flow --      Pain Score 06/14/18 1358 5   Constitutional: Alert and oriented.  Eyes: Conjunctivae are normal.  ENT      Head: Normocephalic and atraumatic.      Nose: No congestion/rhinnorhea.      Mouth/Throat: Mucous membranes are moist.      Neck: No stridor. Hematological/Lymphatic/Immunilogical: No cervical lymphadenopathy. Cardiovascular: Normal rate, regular rhythm.  No murmurs, rubs, or gallops. Respiratory: Normal respiratory effort without tachypnea nor retractions. Breath sounds are clear and equal bilaterally. No wheezes/rales/rhonchi. Gastrointestinal: Soft and non tender. No rebound. No guarding. GUIAC negative.  Genitourinary: Deferred Musculoskeletal: Normal range of motion in all extremities. No lower extremity edema. Neurologic:  Normal speech and language. No gross focal neurologic deficits are appreciated.  Skin:  Skin is warm, dry and intact. No rash noted. Psychiatric: Mood and affect are normal. Speech and behavior are normal. Patient exhibits appropriate insight and judgment.  ____________________________________________    LABS (pertinent positives/negatives)  CBC wbc 8.0, hgb 5.5, plt 869 INR 1.1 CMP wnl except ca 8.3 Covid negative ____________________________________________   EKG  None  ____________________________________________     RADIOLOGY  None  ____________________________________________   PROCEDURES  Procedures  CRITICAL CARE Performed by: Phineas SemenGraydon Marvell Tamer   Total critical care time: 30 minutes  Critical care time was exclusive of separately billable procedures and treating other patients.  Critical care was necessary to treat or prevent imminent or life-threatening deterioration.  Critical care was time spent personally by me on the following activities: development of treatment plan with patient and/or surrogate as well as nursing, discussions with consultants, evaluation of patient's response to treatment, examination of patient, obtaining history from patient or surrogate, ordering and performing treatments and interventions, ordering and review of laboratory studies, ordering and review of radiographic studies, pulse oximetry and re-evaluation of patient's condition.  ____________________________________________   INITIAL IMPRESSION / ASSESSMENT AND PLAN / ED COURSE  Pertinent labs & imaging results that were available during my care of the patient were reviewed by me and considered in my medical decision making (see chart for details).   Patient presented to the emergency department today because of concerns for significant anemia found at primary care doctor's office.  Patient has been complaining of some shortness of breath and feeling sluggish.  Patient's hemoglobin  5.5 here.  Patient was guaiac negative. Will plan on transfusion. Discussed risks and benefit with patient. Will plan on admission.  ____________________________________________   FINAL CLINICAL IMPRESSION(S) / ED DIAGNOSES  Final diagnoses:  Symptomatic anemia     Note: This dictation was prepared with Dragon dictation. Any transcriptional errors that result from this process are unintentional     Phineas Semen, MD 06/14/18 2018    Phineas Semen, MD 06/27/18 1510

## 2018-06-14 NOTE — ED Triage Notes (Signed)
Pt via pov from work. She had blood drawn today and was called by her pcp office and told to come to the ed for possible transfusion due to low hemoglobin. She reports that she has been feeling tired lately. Pt denies recent rectal bleeding or vomiting blood. Pt alert & oriented with NAD noted.

## 2018-06-14 NOTE — H&P (Signed)
[facility] [place] [facility]   PATIENT NAME: Madison SonsShaneisaka Totton    MR#:  [id number]  DATE OF BIRTH:  09/22/1970  DATE OF ADMISSION:  06/14/2018  PRIMARY CARE PHYSICIAN: Cyndie MullSantayana, Gloria Patricia, DO   REQUESTING/REFERRING PHYSICIAN: Derrill KayGoodman, MD  CHIEF COMPLAINT:   Chief Complaint  Patient presents with  . Abnormal Lab    HISTORY OF PRESENT ILLNESS:  Madison Lewis  is a 48 y.o. female who presents with chief complaint as above.  Patient presents the ED with a complaint of progressive fatigue and some shortness of breath.  She went to her PCP and was found to have low hemoglobin.  Here in the ED this was confirmed, hemoglobin was 5.5.  She states that about 2 years ago her hemoglobin had fallen to when she required blood transfusion.  At that time it was felt to be due to her heavy menses.  She states that her menstrual cycles have not been very heavy recently.  Patient does state that she noted a little bit of blood in her stool about 2 weeks ago, and that this happens intermittently on rare occasions.  She states it was not very much blood, happened only once, and has not happened since.  She does state that she has been having ice pica for about the past month or so.  Transfusion initiated in the ED and hospitalist called for admission  PAST MEDICAL HISTORY:   Past Medical History:  Diagnosis Date  . Hypertension      PAST SURGICAL HISTORY:   Past Surgical History:  Procedure Laterality Date  . CESAREAN SECTION    . COLONOSCOPY WITH PROPOFOL N/A 05/19/2016   Procedure: COLONOSCOPY WITH PROPOFOL;  Surgeon: Midge MiniumWohl, Darren, MD;  [facility] ENDOSCOPY;  Service: Endoscopy;  Laterality: N/A;  . ESOPHAGOGASTRODUODENOSCOPY (EGD) WITH PROPOFOL N/A 05/19/2016   Procedure: ESOPHAGOGASTRODUODENOSCOPY (EGD) WITH PROPOFOL;  Surgeon: Midge MiniumWohl, Darren, MD;  Location: [facility] ENDOSCOPY;  Service: Endoscopy;  Laterality: N/A;  . KNEE SURGERY Bilateral      SOCIAL  HISTORY:   Social History   Tobacco Use  . Smoking status: Never Smoker  . Smokeless tobacco: Never Used  Substance Use Topics  . Alcohol use: Yes    Alcohol/week: 1.0 standard drinks    Types: 1 Cans of beer per week     FAMILY HISTORY:   Family History  Problem Relation Age of Onset  . Hypertension Father   . Breast cancer Neg Hx      DRUG ALLERGIES:  No Known Allergies  MEDICATIONS AT HOME:   Prior to Admission medications   Medication Sig Start Date End Date Taking? Authorizing Provider  hydrochlorothiazide (HYDRODIURIL) 25 MG tablet Take 25 mg by mouth daily.   Yes [provider]  losartan (COZAAR) 100 MG tablet Take 100 mg by mouth daily.   Yes [provider]  metoprolol succinate (TOPROL-XL) 100 MG 24 hr tablet Take 100 mg by mouth daily. Take with or immediately following a meal.   Yes [provider]  Multiple Vitamin (MULTIVITAMIN WITH MINERALS) TABS tablet Take 1 tablet by mouth daily. 05/20/16  Yes Ramonita LabGouru, Aruna, MD    REVIEW OF SYSTEMS:  Review of Systems  Constitutional: Positive for malaise/fatigue. Negative for chills, fever and weight loss.  HENT: Negative for ear pain, hearing loss and tinnitus.   Eyes: Negative for blurred vision, double vision, pain and redness.  Respiratory: Positive for shortness of breath. Negative for cough and hemoptysis.   Cardiovascular:  Negative for chest pain, palpitations, orthopnea and leg swelling.  Gastrointestinal: Positive for blood in stool. Negative for abdominal pain, constipation, diarrhea, nausea and vomiting.  Genitourinary: Negative for dysuria, frequency and hematuria.  Musculoskeletal: Negative for back pain, joint pain and neck pain.  Skin:       No acne, rash, or lesions  Neurological: Negative for dizziness, tremors, focal weakness and weakness.  Endo/Heme/Allergies: Negative for polydipsia. Does not bruise/bleed easily.  Psychiatric/Behavioral: Negative for depression. The  patient is not nervous/anxious and does not have insomnia.      VITAL SIGNS:   Vitals:   06/14/18 2010 06/14/18 2030 06/14/18 2100 06/14/18 2130  BP: (!) 183/90 (!) 183/91 (!) 175/80 (!) 178/83  Pulse: 88 84 91 90  Resp: 16     Temp: (!) 97.4 F (36.3 C)     TempSrc: Oral     SpO2: 99% 100% 99% 100%  Weight:      Height:       Wt Readings from Last 3 Encounters:  06/14/18 99.8 kg  05/17/16 93.9 kg  01/05/15 81.6 kg    PHYSICAL EXAMINATION:  Physical Exam  Vitals reviewed. Constitutional: She is oriented to person, place, and time. She appears well-developed and well-nourished. No distress.  HENT:  Head: Normocephalic and atraumatic.  Mouth/Throat: Oropharynx is clear and moist.  Eyes: Pupils are equal, round, and reactive to light. EOM are normal. No scleral icterus.  Neck: Normal range of motion. Neck supple. No JVD present. No thyromegaly present.  Cardiovascular: Normal rate, regular rhythm and intact distal pulses. Exam reveals no gallop and no friction rub.  No murmur heard. Respiratory: Effort normal and breath sounds normal. No respiratory distress. She has no wheezes. She has no rales.  GI: Soft. Bowel sounds are normal. She exhibits no distension. There is no abdominal tenderness.  Musculoskeletal: Normal range of motion.        General: No edema.     Comments: No arthritis, no gout  Lymphadenopathy:    She has no cervical adenopathy.  Neurological: She is alert and oriented to person, place, and time. No cranial nerve deficit.  No dysarthria, no aphasia  Skin: Skin is warm and dry. No rash noted. No erythema.  Psychiatric: She has a normal mood and affect. Her behavior is normal. Judgment and thought content normal.    LABORATORY PANEL:   CBC Recent Labs  Lab 06/14/18 1419  WBC 8.0  HGB 5.5*  HCT 20.9*  PLT 869*   ------------------------------------------------------------------------------------------------------------------  Chemistries   Recent Labs  Lab 06/14/18 1419  NA 135  K 3.9  CL 106  CO2 22  GLUCOSE 96  BUN 9  CREATININE 0.93  CALCIUM 8.3*  AST 20  ALT 15  ALKPHOS 68  BILITOT 0.4   ------------------------------------------------------------------------------------------------------------------  Cardiac Enzymes No results for input(s): TROPONINI in the last 168 hours. ------------------------------------------------------------------------------------------------------------------  RADIOLOGY:  No results found.  EKG:   Orders placed or performed during the hospital encounter of 05/17/16  . ED EKG  . ED EKG  . EKG    IMPRESSION AND PLAN:  Principal Problem:   Symptomatic anemia -patient transfused 1 unit PRBC in the ED, we recheck her hemoglobin and if it is still less than 7 will transfuse a second unit. Active Problems:   Uncontrolled hypertension -continue home antihypertensives, additional PRN antihypertensives here for blood pressure goal less than 160/100   Blood in stool -this only occurred once and patient reports very low volume.  However, given  that she reports not having heavy menses either, there is some question as to why she is so anemic.  We will get a GI consult to help further investigate any possibility of GI source of blood loss.  Twice daily IV PPI for now  Chart review performed and case discussed with ED provider. Labs, imaging and/or ECG reviewed by provider and discussed with patient/family. Management plans discussed with the patient and/or family.  COVID-19 status: Tested negative     DVT PROPHYLAXIS: Mechanical only  GI PROPHYLAXIS:  PPI   ADMISSION STATUS: Inpatient     CODE STATUS: Full Code Status History    Date Active Date Inactive Code Status Order ID Comments User Context   05/17/2016 2302 05/19/2016 2039 Full Code [id number]  Shaune Pollack, MD Inpatient      TOTAL TIME TAKING CARE OF THIS PATIENT: 45 minutes.   This patient was evaluated in the context  of the global COVID-19 pandemic, which necessitated consideration that the patient might be at risk for infection with the SARS-CoV-2 virus that causes COVID-19. Institutional protocols and algorithms that pertain to the evaluation of patients at risk for COVID-19 are in a state of rapid change based on information released by regulatory bodies including the CDC and federal and state organizations. These policies and algorithms were followed to the best of this provider's knowledge to date during the patient's care at this facility.  Barney Drain 06/14/2018, 10:28 PM  [facility]  Office  [phone]  CC: Primary care physician; Cyndie Mull, DO  Note:  This document was prepared using Dragon voice recognition software and may include unintentional dictation errors.

## 2018-06-14 NOTE — ED Notes (Signed)
ED TO INPATIENT HANDOFF REPORT  ED Nurse Name and Phone #: Dewayne Hatchnn [phone]  S Name/Age/Gender Madison Lewis 48 y.o. female Room/Bed: ED18A/ED18A  Code Status   Code Status: Prior  Home/SNF/Other Home Patient oriented to: self, place, time and situation Is this baseline? Yes   Triage Complete: Triage complete  Chief Complaint sent by dr. low hemoglobin  Triage Note Pt via pov from work. She had blood drawn today and was called by her pcp office and told to come to the ed for possible transfusion due to low hemoglobin. She reports that she has been feeling tired lately. Pt denies recent rectal bleeding or vomiting blood. Pt alert & oriented with NAD noted.    Allergies No Known Allergies  Level of Care/Admitting Diagnosis ED Disposition    ED Disposition Condition Comment   Admit  [facility]ospital [facility] [[id number]]  Level of Care: Med-Surg [16]  Covid Evaluation: Confirmed COVID Negative  Diagnosis: Symptomatic anemia [[id number]][[id number]]  Admitting Physician: Oralia ManisWILLIS, DAVID [[id number]]  Attending Physician: Oralia ManisWILLIS, DAVID [phone][[id number]]  Estimated length of stay: past midnight tomorrow  Certification:: I certify this patient will need inpatient services for at least 2 midnights  PT Class (Do Not Modify): Inpatient [101]  PT Acc Code (Do Not Modify): Private [1]       B Medical/Surgery History Past Medical History:  Diagnosis Date  . Hypertension    Past Surgical History:  Procedure Laterality Date  . CESAREAN SECTION    . COLONOSCOPY WITH PROPOFOL N/A 05/19/2016   Procedure: COLONOSCOPY WITH PROPOFOL;  Surgeon: Midge MiniumWohl, Darren, MD;  [facility] END[facility]COPY;  Service: Endoscopy;  Laterality: N/A;  . ESOPHAGOGASTRODUODENOSCOPY (EGD) WITH PROPOFOL N/A 05/19/2016   Procedure: ESOPHAGOGASTRODUODENOSCOPY (EGD) WITH PROPOFOL;  Surgeon: Midge MiniumWohl, Darren, MD;  Location: [facility] ENDOSCOPY;  Service: Endoscopy;  Laterality: N/A;  . KNEE SURGERY Bilateral      A IV  Location/Drains/Wounds Patient Lines/Drains/Airways Status   Active Line/Drains/Airways    Name:   Placement date:   Placement time:   Site:   Days:   Peripheral IV 06/14/18 Left;Upper Arm   06/14/18    1418    Arm   less than 1   Peripheral IV 06/14/18 Left Forearm   06/14/18    1729    Forearm   less than 1          Intake/Output Last 24 hours  Intake/Output Summary (Last 24 hours) at 06/14/2018 2236 Last data filed at 06/14/2018 1753 Gross per 24 hour  Intake 280 ml  Output -  Net 280 ml    Labs/Imaging Results for orders placed or performed during the hospital encounter of 06/14/18 (from the past 48 hour(s))  Comprehensive metabolic panel     Status: Abnormal   Collection Time: 06/14/18  2:19 PM  Result Value Ref Range   Sodium 135 135 - 145 mmol/L   Potassium 3.9 3.5 - 5.1 mmol/L   Chloride 106 98 - 111 mmol/L   CO2 22 22 - 32 mmol/L   Glucose, Bld 96 70 - 99 mg/dL   BUN 9 6 - 20 mg/dL   Creatinine, Ser 8.650.93 0.44 - 1.00 mg/dL   Calcium 8.3 (L) 8.9 - 10.3 mg/dL   Total Protein 7.3 6.5 - 8.1 g/dL   Albumin 3.8 3.5 - 5.0 g/dL   AST 20 15 - 41 U/L   ALT 15 0 - 44 U/L   Alkaline Phosphatase 68 38 - 126 U/L   Total Bilirubin 0.4 0.3 -  1.2 mg/dL   GFR calc non Af Amer >60 >60 mL/min   GFR calc Af Amer >60 >60 mL/min   Anion gap 7 5 - 15    Comment: Performed at [facility], [address] [place], [place] [address]  CBC     Status: Abnormal   Collection Time: 06/14/18  2:19 PM  Result Value Ref Range   WBC 8.0 4.0 - 10.5 K/uL   RBC 2.95 (L) 3.87 - 5.11 MIL/uL   Hemoglobin 5.5 (L) 12.0 - 15.0 g/dL    Comment: Reticulocyte Hemoglobin testing may be clinically indicated, consider ordering this additional test [id number]    HCT 20.9 (L) 36.0 - 46.0 %   MCV 70.8 (L) 80.0 - 100.0 fL   MCH 18.6 (L) 26.0 - 34.0 pg   MCHC 26.3 (L) 30.0 - 36.0 g/dL   RDW 79.4 (H) 80.1 - 65.5 %   Platelets 869 (H) 150 - 400 K/uL   nRBC 2.3 (H) 0.0 - 0.2 %    Comment: Performed  at [facility], [address] [place], [place] [address]  Type and screen [facility]     Status: None (Preliminary result)   Collection Time: 06/14/18  2:19 PM  Result Value Ref Range   ABO/RH(D) B POS    Antibody Screen NEG    Sample Expiration 06/17/2018,2359    Unit Number [id number]    Blood Component Type RED CELLS,LR    Unit division 00    Status of Unit ISSUED    Transfusion Status OK TO TRANSFUSE    Crossmatch Result      Compatible Performed at [facility], [address], [place], [place] [address]   Protime-INR - (order if Patient is taking Coumadin / Warfarin)     Status: None   Collection Time: 06/14/18  2:19 PM  Result Value Ref Range   Prothrombin Time 13.7 11.4 - 15.2 seconds   INR 1.1 0.8 - 1.2    Comment: (NOTE) INR goal varies based on device and disease states. Performed at [facility], [address] [place], [place] [address]   Prepare RBC     Status: None   Collection Time: 06/14/18  4:57 PM  Result Value Ref Range   Order Confirmation      ORDER PROCESSED BY BLOOD BANK Performed at [facility], [address] [place], [place] [address]   SARS Coronavirus 2 [facility] order, Performed in [facility] hospital lab)     Status: None   Collection Time: 06/14/18  5:32 PM  Result Value Ref Range   SARS Coronavirus 2 NEGATIVE NEGATIVE    Comment: (NOTE) If result is NEGATIVE SARS-CoV-2 target nucleic acids are NOT DETECTED. The SARS-CoV-2 RNA is generally detectable in upper and lower  respiratory specimens during the acute phase of infection. The lowest  concentration of SARS-CoV-2 viral copies this assay can detect is 250  copies / mL. A negative result does not preclude SARS-CoV-2 infection  and should not be used as the sole basis for treatment or other  patient management decisions.  A negative result may occur with  improper specimen collection / handling, submission of specimen  other  than nasopharyngeal swab, presence of viral mutation(s) within the  areas targeted by this assay, and inadequate number of viral copies  (<250 copies / mL). A negative result must be combined with clinical  observations, patient history, and epidemiological information. If result is POSITIVE SARS-CoV-2 target  nucleic acids are DETECTED. The SARS-CoV-2 RNA is generally detectable in upper and lower  respiratory specimens dur ing the acute phase of infection.  Positive  results are indicative of active infection with SARS-CoV-2.  Clinical  correlation with patient history and other diagnostic information is  necessary to determine patient infection status.  Positive results do  not rule out bacterial infection or co-infection with other viruses. If result is PRESUMPTIVE POSTIVE SARS-CoV-2 nucleic acids MAY BE PRESENT.   A presumptive positive result was obtained on the submitted specimen  and confirmed on repeat testing.  While 2019 novel coronavirus  (SARS-CoV-2) nucleic acids may be present in the submitted sample  additional confirmatory testing may be necessary for epidemiological  and / or clinical management purposes  to differentiate between  SARS-CoV-2 and other Sarbecovirus currently known to infect humans.  If clinically indicated additional testing with an alternate test  methodology [phone]) is advised. The SARS-CoV-2 RNA is generally  detectable in upper and lower respiratory sp ecimens during the acute  phase of infection. The expected result is Negative. Fact Sheet for Patients:  BoilerBrush.com.cy Fact Sheet for Healthcare Providers: https://pope.com/ This test is not yet approved or cleared by the [place] FDA and has been authorized for detection and/or diagnosis of SARS-CoV-2 by FDA under an Emergency Use Authorization (EUA).  This EUA will remain in effect (meaning this test can be used) for the duration  of the COVID-19 declaration under Section 564(b)(1) of the Act, 21 U.S.C. section 360bbb-3(b)(1), unless the authorization is terminated or revoked sooner. Performed at [facility], [address] [place], [place] [address]   Pregnancy, urine POC     Status: None   Collection Time: 06/14/18  7:52 PM  Result Value Ref Range   Preg Test, Ur NEGATIVE NEGATIVE    Comment:        THE SENSITIVITY OF THIS METHODOLOGY IS >24 mIU/mL    No results found.  Pending Labs [employer] (From admission, onward)    Start     Ordered   Signed and Held  HIV antibody (Routine Testing)  Once,   R     Signed and Held   Signed and Armed forces training and education officer morning,   R     Signed and Held   Signed and Held  CBC  Tomorrow morning,   R     Signed and Held   Signed and Held  Hemoglobin  Once,   R     Signed and Held          Vitals/Pain Today's Vitals   06/14/18 2010 06/14/18 2030 06/14/18 2100 06/14/18 2130  BP: (!) 183/90 (!) 183/91 (!) 175/80 (!) 178/83  Pulse: 88 84 91 90  Resp: 16     Temp: (!) 97.4 F (36.3 C)     TempSrc: Oral     SpO2: 99% 100% 99% 100%  Weight:      Height:      PainSc:        Isolation Precautions No active isolations  Medications Medications  0.9 %  sodium chloride infusion (has no administration in time range)  labetalol (NORMODYNE) injection 10 mg (has no administration in time range)  pantoprazole (PROTONIX) injection 40 mg (has no administration in time range)    Mobility walks Low fall risk   Focused Assessments na   R Recommendations: See Admitting Provider Note  Report given to:   Additional Notes: na

## 2018-06-15 LAB — CBC
HCT: 23.6 % — ABNORMAL LOW (ref 36.0–46.0)
Hemoglobin: 6.4 g/dL — ABNORMAL LOW (ref 12.0–15.0)
MCH: 20 pg — ABNORMAL LOW (ref 26.0–34.0)
MCHC: 27.1 g/dL — ABNORMAL LOW (ref 30.0–36.0)
MCV: 73.8 fL — ABNORMAL LOW (ref 80.0–100.0)
Platelets: 824 10*3/uL — ABNORMAL HIGH (ref 150–400)
RBC: 3.2 MIL/uL — ABNORMAL LOW (ref 3.87–5.11)
RDW: 30.1 % — ABNORMAL HIGH (ref 11.5–15.5)
WBC: 10.9 10*3/uL — ABNORMAL HIGH (ref 4.0–10.5)
nRBC: 1.2 % — ABNORMAL HIGH (ref 0.0–0.2)

## 2018-06-15 LAB — BASIC METABOLIC PANEL
Anion gap: 6 (ref 5–15)
BUN: 13 mg/dL (ref 6–20)
CO2: 22 mmol/L (ref 22–32)
Calcium: 8.4 mg/dL — ABNORMAL LOW (ref 8.9–10.3)
Chloride: 109 mmol/L (ref 98–111)
Creatinine, Ser: 1.16 mg/dL — ABNORMAL HIGH (ref 0.44–1.00)
GFR calc Af Amer: >60 mL/min (ref >60)
GFR calc non Af Amer: 55 mL/min — ABNORMAL LOW (ref >60)
Glucose, Bld: 105 mg/dL — ABNORMAL HIGH (ref 70–99)
Potassium: 3.7 mmol/L (ref 3.5–5.1)
Sodium: 137 mmol/L (ref 135–145)

## 2018-06-15 LAB — HEMOGLOBIN AND HEMATOCRIT, BLOOD
HCT: 27.3 % — ABNORMAL LOW (ref 36.0–46.0)
Hemoglobin: 7.7 g/dL — ABNORMAL LOW (ref 12.0–15.0)

## 2018-06-15 LAB — PREPARE RBC (CROSSMATCH)

## 2018-06-15 MED ORDER — HYDRALAZINE HCL 25 MG PO TABS
25.0000 mg | ORAL_TABLET | Freq: Three times a day (TID) | ORAL | Status: DC
  Administered 2018-06-15 (×2): 25 mg via ORAL
  Filled 2018-06-15 (×2): qty 1

## 2018-06-15 MED ORDER — SODIUM CHLORIDE 0.9% IV SOLUTION: Freq: Once | INTRAVENOUS | Status: DC

## 2018-06-15 MED ORDER — GUAIFENESIN 100 MG/5ML PO SOLN
5.0000 mL | ORAL | Status: DC | PRN
  Filled 2018-06-15 (×2): qty 5

## 2018-06-15 NOTE — Plan of Care (Addendum)
Hemoglobin has increased from the level recorded this morning. Current hemoglobin level is 7.7 and MD was notified. Patient being monitor for signs of bleeding. The patient indicates she is feeling better at this time.   The nurse has offered to call a family member if the patient wanted a health care provider to inform a family member about their care. The patient indicates she will be talking to her daughter and updating her about her care.   Problem: Education: Goal: Knowledge of General Education information will improve Description Including pain rating scale, medication(s)/side effects and non-pharmacologic comfort measures Outcome: Progressing   Problem: Health Behavior/Discharge Planning: Goal: Ability to manage health-related needs will improve Outcome: Progressing   Problem: Clinical Measurements: Goal: Ability to maintain clinical measurements within normal limits will improve Outcome: Progressing Goal: Will remain free from infection Outcome: Progressing Goal: Diagnostic test results will improve Outcome: Progressing Goal: Respiratory complications will improve Outcome: Progressing Goal: Cardiovascular complication will be avoided Outcome: Progressing   Problem: Activity: Goal: Risk for activity intolerance will decrease Outcome: Progressing   Problem: Nutrition: Goal: Adequate nutrition will be maintained Outcome: Progressing   Problem: Coping: Goal: Level of anxiety will decrease Outcome: Progressing   Problem: Elimination: Goal: Will not experience complications related to bowel motility Outcome: Progressing Goal: Will not experience complications related to urinary retention Outcome: Progressing   Problem: Pain Managment: Goal: General experience of comfort will improve Outcome: Progressing   Problem: Safety: Goal: Ability to remain free from injury will improve Outcome: Progressing   Problem: Skin Integrity: Goal: Risk for impaired skin integrity  will decrease Outcome: Progressing

## 2018-06-15 NOTE — Progress Notes (Signed)
MD notified: Hemoglobin is currently 7.7 after recheck so it has increased since the 1 unit of blood was given yesterday.

## 2018-06-15 NOTE — Progress Notes (Signed)
Dr Sheryle Hail notified Hgb 6.4. Orders given

## 2018-06-15 NOTE — Progress Notes (Signed)
MD notified: Would you like to order additional oral BP meds for this patient she continues to have high blood pressure levels after schedule metoprolol 100mg  given this AM for a blood pressure of 172/87, as well as PRN labetolol . Labetalol was also given again for BP 177/95, and it will be given again for a blood pressure of 202/100.

## 2018-06-15 NOTE — Consult Note (Signed)
[facility] GI Inpatient Consult Note   Jamey Reas, M.D.  Reason for Consult: Symptomatic anemia   Attending Requesting Consult: Ihor Austin, M.D.  History of Present Illness: Madison Lewis is a 48 y.o. female with a history of hypertension presenting to the emergency room with 1 month of progressive dyspnea and weakness.  Patient denies any abdominal pain, melena, hematochezia or hematemesis.  No previous history of peptic ulcer disease.  Patient has symptomatic anemia in May 2018 and underwent endoluminal evaluation from Dr. Servando Snare, gastroenterologist.  EGD and colonoscopy were unremarkable for polyps, ulcers, malignancies or other bleeding lesions. In terms of any GI symptoms, the patient complains of mild nausea, otherwise negative. The patient has a history of menorrhagia on her last hospital admission in 2018 but was seen by a gynecologist shortly after hospital discharge at that time.  Patient apparently was started on some hormone therapy which decreased the volume and frequency of menses, however she still has regular periods.  She does not currently follow with a gynecologist on a routine basis.  Past Medical History:  Past Medical History:  Diagnosis Date  . Hypertension     Problem List: Patient Active Problem List   Diagnosis Date Noted  . Uncontrolled hypertension 06/14/2018  . Iron deficiency anemia   . Hematochezia   . Symptomatic anemia 05/17/2016  . Prediabetes 03/24/2015  . Chronic tension-type headache, not intractable 03/18/2015  . 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 Essential hypertension 03/18/2015    Past Surgical History: Past Surgical History:  Procedure Laterality Date  . CESAREAN SECTION    . COLONOSCOPY WITH PROPOFOL N/A 05/19/2016   Procedure: COLONOSCOPY WITH PROPOFOL;  Surgeon: Midge Minium, MD;  Location: [facility] ENDOSCOPY;  Service: Endoscopy;  Laterality: N/A;  . ESOPHAGOGASTRODUODENOSCOPY (EGD) WITH PROPOFOL N/A 05/19/2016    Procedure: ESOPHAGOGASTRODUODENOSCOPY (EGD) WITH PROPOFOL;  Surgeon: Midge Minium, MD;  Location: [facility] ENDOSCOPY;  Service: Endoscopy;  Laterality: N/A;  . KNEE SURGERY Bilateral     Allergies: No Known Allergies  Home Medications: Medications Prior to Admission  Medication Sig Dispense Refill Last Dose  . hydrochlorothiazide (HYDRODIURIL) 25 MG tablet Take 25 mg by mouth daily.   Unknown at Unknown  . losartan (COZAAR) 100 MG tablet Take 100 mg by mouth daily.   06/14/2018 at 0800  . metoprolol succinate (TOPROL-XL) 100 MG 24 hr tablet Take 100 mg by mouth daily. Take with or immediately following a meal.   Unknown at Unknown  . Multiple Vitamin (MULTIVITAMIN WITH MINERALS) TABS tablet Take 1 tablet by mouth daily.   Unknown at Unknown   Home medication reconciliation was completed with the patient.   Scheduled Inpatient Medications:   . sodium chloride   Intravenous Once  . losartan  100 mg Oral Daily  . metoprolol succinate  100 mg Oral Daily  . pantoprazole (PROTONIX) IV  40 mg Intravenous Q12H    Continuous Inpatient Infusions:    PRN Inpatient Medications:  acetaminophen **OR** acetaminophen, labetalol, ondansetron **OR** ondansetron (ZOFRAN) IV  Family History: family history includes Hypertension in her father.   GI Family History: Negative  Social History:   reports that she has never smoked. She has never used smokeless tobacco. She reports current alcohol use of about 1.0 standard drinks of alcohol per week. She reports that she does not use drugs. The patient denies ETOH, tobacco, or drug use.    Review of Systems: Review of Systems - History obtained from the patient General ROS: positive for  -  fatigue negative for - fever, hot flashes, night sweats or weight loss Psychological ROS: negative Allergy and Immunology ROS: negative Hematological and Lymphatic ROS: positive for - pallor negative for - bleeding problems or blood clots Endocrine ROS:  negative Respiratory ROS: positive for - shortness of breath negative for - cough, hemoptysis, pleuritic pain or wheezing Cardiovascular ROS: positive for - chest pain negative for - edema, orthopnea, palpitations, paroxysmal nocturnal dyspnea or rapid heart rate Genito-Urinary ROS: no dysuria, trouble voiding, or hematuria Musculoskeletal ROS: negative Neurological ROS: no TIA or stroke symptoms Dermatological ROS: negative  Physical Examination: BP (!) 177/95 Comment: labetolol given  Pulse 72   Temp (!) 97.4 F (36.3 C) (Oral)   Resp 16   Ht  (1.626 m)   Wt 99.8 kg   LMP 05/21/2018 (Approximate)   SpO2 100%   BMI 37.76 kg/m  Physical Exam  Data: Lab Results  Component Value Date   WBC 10.9 (H) 06/15/2018   HGB 7.7 (L) 06/15/2018   HCT 27.3 (L) 06/15/2018   MCV 73.8 (L) 06/15/2018   PLT 824 (H) 06/15/2018   Recent Labs  Lab 06/14/18 1419 06/15/18 0038 06/15/18 0758  HGB 5.5* 6.4* 7.7*   Lab Results  Component Value Date   NA 137 06/15/2018   K 3.7 06/15/2018   CL 109 06/15/2018   CO2 22 06/15/2018   BUN 13 06/15/2018   CREATININE 1.16 (H) 06/15/2018   Lab Results  Component Value Date   ALT 15 06/14/2018   AST 20 06/14/2018   ALKPHOS 68 06/14/2018   BILITOT 0.4 06/14/2018   Recent Labs  Lab 06/14/18 1419  INR 1.1   CBC Latest Ref Rng & Units 06/15/2018 06/15/2018 06/14/2018  WBC 4.0 - 10.5 K/uL - 10.9(H) 8.0  Hemoglobin 12.0 - 15.0 g/dL 7.7(L) 6.4(L) 5.5(L)  Hematocrit 36.0 - 46.0 % 27.3(L) 23.6(L) 20.9(L)  Platelets 150 - 400 K/uL - 824(H) 869(H)    STUDIES: No results found. @  Assessment:  1. Symptomatic microcytic anemia-likely secondary to iron deficiency either related to occult GI blood loss, uterine blood loss or possible bone marrow issue.  2.  History of menorrhagia.  3.  Hypertension  COVID-19 status:     Tested negative     Recommendations:  1.  Continue transfusions as clinically indicated for symptomatic  improvement and increased oxygen carrying capacity.  2.  I discussed EGD and colonoscopy in detail with the patient and the possible need to repeat the procedures.  The patient had several questions regarding possible yield of the procedures and what I was likely to find that would be different than 2 years ago.  I told the patient I was not sure and that most likely it would not yield much in terms of new findings.  Patient declines procedures for these reasons although they were offered to her.  3.  I offered outpatient small bowel capsule endoscopy which the patient has theoretically agreed to and to follow-up with me in the office in the outpatient setting.  We will set that up and do time.  4.  Patient is advised to follow-up with a gynecologist regarding.  In terms of any further evaluation that may be needed.  5.  I recommend a outpatient hematology referral to rule out any blood dyscrasias that could contribute to chronic anemia.  6.  Advance diet.  7.  I will sign off for now.  Call in the interim if I can help.  Thank you for  the consult. Please call with questions or concerns.  Rosina Lowensteinoledo, Alaiah Lundy, "Mellody DanceKeith" MD [facility] [address] [place], [place] [address] [phone]  06/15/2018 4:21 PM

## 2018-06-15 NOTE — Progress Notes (Signed)
Advanced care plan. Purpose of the Encounter: CODE STATUS Parties in Attendance: Patient Patient's Decision Capacity: Good Subjective/Patient's story: Madison Lewis  is a 48 y.o. female who presents with chief complaint as above.  Patient presents the ED with a complaint of progressive fatigue and some shortness of breath.  She went to her PCP and was found to have low hemoglobin.  Here in the ED this was confirmed, hemoglobin was 5.5.  She states that about 2 years ago her hemoglobin had fallen to when she required blood transfusion.  At that time it was felt to be due to her heavy menses.  She states that her menstrual cycles have not been very heavy recently.  Patient does state that she noted a little bit of blood in her stool about 2 weeks ago, and that this happens intermittently on rare occasions.  She states it was not very much blood, happened only once, and has not happened since.  She does state that she has been having ice pica for about the past month or so.  Transfusion initiated in the ED and hospitalist called for admission Objective/Medical story Patient needs PRBC transfusion gastroenterology evaluation.  Needs further work-up for anemia. Goals of care determination:  Advance care directives goals of care and treatment plan discussed Patient wants everything done which includes CPR, intubation ventilator if the need arises. CODE STATUS: Full code Time spent discussing advanced care planning: 16 minutes

## 2018-06-15 NOTE — Progress Notes (Signed)
Dr Sheryle Hail notified lab states can only order 1 unit of blood at a time, recheck Hgb after unit finishes.

## 2018-06-15 NOTE — Progress Notes (Addendum)
[facility] [facility] - [place] [facility]   PATIENT NAME: Madison Lewis    MR#:  [id number]  DATE OF BIRTH:  02/07/1970  SUBJECTIVE:  CHIEF COMPLAINT:   Chief Complaint  Patient presents with  . Abnormal Lab  Patient seen today Tolerated 2 units of PRBC transfusion well No new episodes of vomiting blood Fatigue and weakness present  REVIEW OF SYSTEMS:    ROS  CONSTITUTIONAL: No documented fever. Has fatigue, weakness. No weight gain, no weight loss.  EYES: No blurry or double vision.  ENT: No tinnitus. No postnasal drip. No redness of the oropharynx.  RESPIRATORY: No cough, no wheeze, no hemoptysis. No dyspnea.  CARDIOVASCULAR: No chest pain. No orthopnea. No palpitations. No syncope.  GASTROINTESTINAL: No nausea, no vomiting or diarrhea. No abdominal pain.  Has occasional melena GENITOURINARY: No dysuria or hematuria.  ENDOCRINE: No polyuria or nocturia. No heat or cold intolerance.  HEMATOLOGY: No anemia. No bruising. No bleeding.  INTEGUMENTARY: No rashes. No lesions.  MUSCULOSKELETAL: No arthritis. No swelling. No gout.  NEUROLOGIC: No numbness, tingling, or ataxia. No seizure-type activity.  PSYCHIATRIC: No anxiety. No insomnia. No ADD.   DRUG ALLERGIES:  No Known Allergies  VITALS:  Blood pressure (!) 172/87, pulse 70, temperature 98.3 F (36.8 C), temperature source Oral, resp. rate 18, height 5\' 4"  (1.626 m), weight 99.8 kg, last menstrual period 05/21/2018, SpO2 99 %.  PHYSICAL EXAMINATION:   Physical Exam  GENERAL:  48 y.o.-year-old patient lying in the bed with no acute distress.  EYES: Pupils equal, round, reactive to light and accommodation. No scleral icterus. Extraocular muscles intact.  HEENT: Head atraumatic, normocephalic. Oropharynx and nasopharynx clear.  Pallor present. NECK:  Supple, no jugular venous distention. No thyroid enlargement, no tenderness.  LUNGS: Normal breath sounds bilaterally, no wheezing, rales, rhonchi. No use of  accessory muscles of respiration.  CARDIOVASCULAR: S1, S2 normal. No murmurs, rubs, or gallops.  ABDOMEN: Soft, nontender, nondistended. Bowel sounds present. No organomegaly or mass.  EXTREMITIES: No cyanosis, clubbing or edema b/l.    NEUROLOGIC: Cranial nerves II through XII are intact. No focal Motor or sensory deficits b/l.   PSYCHIATRIC: The patient is alert and oriented x 3.  SKIN: No obvious rash, lesion, or ulcer.   LABORATORY PANEL:   CBC Recent Labs  Lab 06/15/18 0038 06/15/18 0758  WBC 10.9*  --   HGB 6.4* 7.7*  HCT 23.6* 27.3*  PLT 824*  --    ------------------------------------------------------------------------------------------------------------------ Chemistries  Recent Labs  Lab 06/14/18 1419 06/15/18 0038  NA 135 137  K 3.9 3.7  CL 106 109  CO2 22 22  GLUCOSE 96 105*  BUN 9 13  CREATININE 0.93 1.16*  CALCIUM 8.3* 8.4*  AST 20  --   ALT 15  --   ALKPHOS 68  --   BILITOT 0.4  --    ------------------------------------------------------------------------------------------------------------------  Cardiac Enzymes No results for input(s): TROPONINI in the last 168 hours. ------------------------------------------------------------------------------------------------------------------  RADIOLOGY:  No results found.   ASSESSMENT AND PLAN:   48 yr old female patient with history of hypertension under service for anemia  -Symptomatic anemia Transfuse prbc iv Monitor hb and hct GI consult follow-up Could be from menorrhagia  -Hypertension uncontrolled Continue losartan and metoprolol PRN labetalol for better control blood pressure  -DVT prophylaxis with sequential compression devices to lower extremities  All the records are reviewed and case discussed with Care Management/Social Worker. Management plans discussed with the patient, family and they are in agreement.  CODE STATUS: Full code  DVT Prophylaxis: SCDs  TOTAL TIME TAKING CARE  OF THIS PATIENT: 38 minutes.   POSSIBLE D/C IN 2 to 3 DAYS, DEPENDING ON CLINICAL CONDITION.  Ihor Austin M.D on 06/15/2018 at 10:59 AM  Between 7am to 6pm - Pager - [phone]  After 6pm go to [email or web address] - password [email or web address] [facility]  [facility] [place] [facility]  Office  [phone]  CC: Primary care physician; Cyndie Mull, DO  Note: This dictation was prepared with Dragon dictation along with smaller phrase technology. Any transcriptional errors that result from this process are unintentional.

## 2018-06-16 LAB — TYPE AND SCREEN
ABO/RH(D): B POS
Antibody Screen: NEGATIVE
Unit division: 0
Unit division: 0

## 2018-06-16 LAB — CBC
HCT: 29 % — ABNORMAL LOW (ref 36.0–46.0)
Hemoglobin: 8.2 g/dL — ABNORMAL LOW (ref 12.0–15.0)
MCH: 21 pg — ABNORMAL LOW (ref 26.0–34.0)
MCHC: 28.3 g/dL — ABNORMAL LOW (ref 30.0–36.0)
MCV: 74.2 fL — ABNORMAL LOW (ref 80.0–100.0)
Platelets: 777 10*3/uL — ABNORMAL HIGH (ref 150–400)
RBC: 3.91 MIL/uL (ref 3.87–5.11)
RDW: 29.8 % — ABNORMAL HIGH (ref 11.5–15.5)
WBC: 10.6 10*3/uL — ABNORMAL HIGH (ref 4.0–10.5)
nRBC: 1.2 % — ABNORMAL HIGH (ref 0.0–0.2)

## 2018-06-16 LAB — BPAM RBC
Blood Product Expiration Date: 202006232359
Blood Product Expiration Date: 202006262359
ISSUE DATE / TIME: 202005291749
ISSUE DATE / TIME: 202005300315
Unit Type and Rh: 7300
Unit Type and Rh: 7300

## 2018-06-16 LAB — HIV ANTIBODY (ROUTINE TESTING W REFLEX): HIV Screen 4th Generation wRfx: NONREACTIVE

## 2018-06-16 MED ORDER — HYDRALAZINE HCL 50 MG PO TABS
50.0000 mg | ORAL_TABLET | Freq: Three times a day (TID) | ORAL | Status: DC
  Administered 2018-06-16: 50 mg via ORAL
  Filled 2018-06-16: qty 1

## 2018-06-16 MED ORDER — HYDROCHLOROTHIAZIDE 25 MG PO TABS: 25.0000 mg | ORAL_TABLET | Freq: Every day | ORAL | 0 refills | Status: DC

## 2018-06-16 MED ORDER — METOPROLOL SUCCINATE ER 100 MG PO TB24: 100.0000 mg | ORAL_TABLET | Freq: Every day | ORAL | 0 refills | Status: DC

## 2018-06-16 MED ORDER — LOSARTAN POTASSIUM 100 MG PO TABS: 100.0000 mg | ORAL_TABLET | Freq: Every day | ORAL | 0 refills | Status: DC

## 2018-06-16 NOTE — Progress Notes (Signed)
MD notified: Blood pressure levels still above the order parameters IV labetolol given this morning for blood pressure of 174/94, BP rechecked 166/88 after PRN BP med given.

## 2018-06-16 NOTE — Discharge Summary (Signed)
[employer] [facility] - [place] at [facility]   PATIENT NAME: Madison Lewis    MR#:  [id number]  DATE OF BIRTH:  1970-04-17  DATE OF ADMISSION:  06/14/2018 ADMITTING PHYSICIAN: Oralia Manis, MD  DATE OF DISCHARGE: 06/16/2018  PRIMARY CARE PHYSICIAN: Cyndie Mull, DO   ADMISSION DIAGNOSIS:  Symptomatic anemia [D64.9]  DISCHARGE DIAGNOSIS:  Principal Problem:   Symptomatic anemia Active Problems:   Uncontrolled hypertension   SECONDARY DIAGNOSIS:   Past Medical History:  Diagnosis Date  . Hypertension      ADMITTING HISTORY Madison Lewis  is a 48 y.o. female who presents with chief complaint as above.  Patient presents the ED with a complaint of progressive fatigue and some shortness of breath.  She went to her PCP and was found to have low hemoglobin.  Here in the ED this was confirmed, hemoglobin was 5.5.  She states that about 2 years ago her hemoglobin had fallen to when she required blood transfusion.  At that time it was felt to be due to her heavy menses.  She states that her menstrual cycles have not been very heavy recently.  Patient does state that she noted a little bit of blood in her stool about 2 weeks ago, and that this happens intermittently on rare occasions.  She states it was not very much blood, happened only once, and has not happened since.  She does state that she has been having ice pica for about the past month or so.  Transfusion initiated in the ED and hospitalist called for admission  HOSPITAL COURSE:  Patient admitted to medical floor and was transfused with PRBC IV.  Patient tolerated blood transfusion well.  Gastroenterology evaluated the patient.  Recently patient had colonoscopy and EGD in the past.  Did not recommend any new intervention.  According to gastroenterology they say anemia secondary to menorrhagia.  Patient's hemoglobin has been stable.  Blood pressure has been controlled in the hospital with medications.  She will  continue metoprolol, thiazide diuretic and losartan at home for better control of blood pressure.  Follow-up with primary care physician in the clinic.  CONSULTS OBTAINED:    DRUG ALLERGIES:  No Known Allergies  DISCHARGE MEDICATIONS:   Allergies as of 06/16/2018   No Known Allergies     Medication List    TAKE these medications   hydrochlorothiazide 25 MG tablet Commonly known as:  HYDRODIURIL Take 1 tablet (25 mg total) by mouth daily for 30 days.   losartan 100 MG tablet Commonly known as:  COZAAR Take 1 tablet (100 mg total) by mouth daily for 30 days.   metoprolol succinate 100 MG 24 hr tablet Commonly known as:  TOPROL-XL Take 1 tablet (100 mg total) by mouth daily for 30 days. Take with or immediately following a meal.   multivitamin with minerals Tabs tablet Take 1 tablet by mouth daily.       Today  Patient seen today No complaints of any chest pain, shortness of breath No fatigue and weakness Tolerating diet well Hemodynamically stable  VITAL SIGNS:  Blood pressure (!) 166/88, pulse 70, temperature 98.2 F (36.8 C), temperature source Oral, resp. rate 20, height 5\' 4"  (1.626 m), weight 99.8 kg, last menstrual period 05/21/2018, SpO2 100 %.  I/O:    Intake/Output Summary (Last 24 hours) at 06/16/2018 1033 Last data filed at 06/16/2018 0947 Gross per 24 hour  Intake 120 ml  Output -  Net 120 ml    PHYSICAL EXAMINATION:  Physical Exam  GENERAL:  48 y.o.-year-old patient lying in the bed with no acute distress.  LUNGS: Normal breath sounds bilaterally, no wheezing, rales,rhonchi or crepitation. No use of accessory muscles of respiration.  CARDIOVASCULAR: S1, S2 normal. No murmurs, rubs, or gallops.  ABDOMEN: Soft, non-tender, non-distended. Bowel sounds present. No organomegaly or mass.  NEUROLOGIC: Moves all 4 extremities. PSYCHIATRIC: The patient is alert and oriented x 3.  SKIN: No obvious rash, lesion, or ulcer.   DATA REVIEW:   CBC Recent  Labs  Lab 06/16/18 0444  WBC 10.6*  HGB 8.2*  HCT 29.0*  PLT 777*    Chemistries  Recent Labs  Lab 06/14/18 1419 06/15/18 0038  NA 135 137  K 3.9 3.7  CL 106 109  CO2 22 22  GLUCOSE 96 105*  BUN 9 13  CREATININE 0.93 1.16*  CALCIUM 8.3* 8.4*  AST 20  --   ALT 15  --   ALKPHOS 68  --   BILITOT 0.4  --     Cardiac Enzymes No results for input(s): TROPONINI in the last 168 hours.  Microbiology Results  Results for orders placed or performed during the hospital encounter of 06/14/18  SARS Coronavirus 2 [facility] order, Performed in [facility] hospital lab)     Status: None   Collection Time: 06/14/18  5:32 PM  Result Value Ref Range Status   SARS Coronavirus 2 NEGATIVE NEGATIVE Final    Comment: (NOTE) If result is NEGATIVE SARS-CoV-2 target nucleic acids are NOT DETECTED. The SARS-CoV-2 RNA is generally detectable in upper and lower  respiratory specimens during the acute phase of infection. The lowest  concentration of SARS-CoV-2 viral copies this assay can detect is 250  copies / mL. A negative result does not preclude SARS-CoV-2 infection  and should not be used as the sole basis for treatment or other  patient management decisions.  A negative result may occur with  improper specimen collection / handling, submission of specimen other  than nasopharyngeal swab, presence of viral mutation(s) within the  areas targeted by this assay, and inadequate number of viral copies  (<250 copies / mL). A negative result must be combined with clinical  observations, patient history, and epidemiological information. If result is POSITIVE SARS-CoV-2 target nucleic acids are DETECTED. The SARS-CoV-2 RNA is generally detectable in upper and lower  respiratory specimens dur ing the acute phase of infection.  Positive  results are indicative of active infection with SARS-CoV-2.  Clinical  correlation with patient history and other diagnostic information is  necessary to  determine patient infection status.  Positive results do  not rule out bacterial infection or co-infection with other viruses. If result is PRESUMPTIVE POSTIVE SARS-CoV-2 nucleic acids MAY BE PRESENT.   A presumptive positive result was obtained on the submitted specimen  and confirmed on repeat testing.  While 2019 novel coronavirus  (SARS-CoV-2) nucleic acids may be present in the submitted sample  additional confirmatory testing may be necessary for epidemiological  and / or clinical management purposes  to differentiate between  SARS-CoV-2 and other Sarbecovirus currently known to infect humans.  If clinically indicated additional testing with an alternate test  methodology [phone]) is advised. The SARS-CoV-2 RNA is generally  detectable in upper and lower respiratory sp ecimens during the acute  phase of infection. The expected result is Negative. Fact Sheet for Patients:  BoilerBrush.com.cy Fact Sheet for Healthcare Providers: https://pope.com/ This test is not yet approved or cleared by the [place] FDA  and has been authorized for detection and/or diagnosis of SARS-CoV-2 by FDA under an Emergency Use Authorization (EUA).  This EUA will remain in effect (meaning this test can be used) for the duration of the COVID-19 declaration under Section 564(b)(1) of the Act, 21 U.S.C. section 360bbb-3(b)(1), unless the authorization is terminated or revoked sooner. Performed at [facility], [address] [place], [place] [address]     RADIOLOGY:  No results found.  Follow up with PCP in 1 week.  Management plans discussed with the patient, family and they are in agreement.  CODE STATUS: Full code    Code Status Orders  (From admission, onward)         Start     Ordered   06/14/18 2330  Full code  Continuous     06/14/18 2329        Code Status History    Date Active Date Inactive Code Status Order ID  Comments User Context   05/17/2016 2302 05/19/2016 2039 Full Code [id number]  Shaune Pollackhen, Qing, MD Inpatient      TOTAL TIME TAKING CARE OF THIS PATIENT ON DAY OF DISCHARGE: more than 35 minutes.   Ihor AustinPavan Kamree Wiens M.D on 06/16/2018 at 10:33 AM  Between 7am to 6pm - Pager - [phone]  After 6pm go to [email or web address] - password [email or web address] [facility]  [facility] [place] [facility]  Office  [phone]  CC: Primary care physician; Cyndie MullSantayana, Gloria Patricia, DO  Note: This dictation was prepared with Dragon dictation along with smaller phrase technology. Any transcriptional errors that result from this process are unintentional.

## 2018-07-05 ENCOUNTER — Telehealth: Payer: Self-pay | Admitting: *Deleted

## 2018-07-05 NOTE — Telephone Encounter (Signed)
Thank you for the update.  Agree with recommendation  I can see her outpatient for work up. If team consult hematology over the weekend, Dr.Rao will see.

## 2018-07-05 NOTE — Telephone Encounter (Signed)
Received a call from answering service that [facility] GI Dr Dava Najjar office regarding this patient requesting STAT appointment  For hgb drop from 8 2 weeks ago to 6.4. I returned call and explained that even if we could see her today, we would not be able to transfuse her in our office at this late time of the day. She stated she will send patient to ER and she has sent over a referral for Hematology, she said Dr [name] requested Dr Tasia Catchings

## 2018-07-06 ENCOUNTER — Other Ambulatory Visit: Payer: Self-pay

## 2018-07-06 ENCOUNTER — Encounter: Payer: Self-pay | Admitting: Emergency Medicine

## 2018-07-06 ENCOUNTER — Observation Stay
Admission: EM | Admit: 2018-07-06 | Discharge: 2018-07-07 | Disposition: A | Discharge status: Home / Self Care | Payer: BC Managed Care – PPO | Attending: Family Medicine | Admitting: Family Medicine

## 2018-07-06 DIAGNOSIS — Z79899 Other long term (current) drug therapy: Secondary | ICD-10-CM | POA: Diagnosis not present

## 2018-07-06 DIAGNOSIS — D649 Anemia, unspecified: Principal | ICD-10-CM | POA: Diagnosis present

## 2018-07-06 DIAGNOSIS — I10 Essential (primary) hypertension: Secondary | ICD-10-CM | POA: Diagnosis not present

## 2018-07-06 DIAGNOSIS — N920 Excessive and frequent menstruation with regular cycle: Secondary | ICD-10-CM | POA: Diagnosis present

## 2018-07-06 DIAGNOSIS — Z1159 Encounter for screening for other viral diseases: Secondary | ICD-10-CM | POA: Insufficient documentation

## 2018-07-06 HISTORY — DX: Encounter for other specified aftercare: Z51.89

## 2018-07-06 LAB — BASIC METABOLIC PANEL
Anion gap: 9 (ref 5–15)
BUN: 22 mg/dL — ABNORMAL HIGH (ref 6–20)
CO2: 24 mmol/L (ref 22–32)
Calcium: 8.8 mg/dL — ABNORMAL LOW (ref 8.9–10.3)
Chloride: 104 mmol/L (ref 98–111)
Creatinine, Ser: 1.22 mg/dL — ABNORMAL HIGH (ref 0.44–1.00)
GFR calc Af Amer: >60 mL/min (ref >60)
GFR calc non Af Amer: 52 mL/min — ABNORMAL LOW (ref >60)
Glucose, Bld: 119 mg/dL — ABNORMAL HIGH (ref 70–99)
Potassium: 3.6 mmol/L (ref 3.5–5.1)
Sodium: 137 mmol/L (ref 135–145)

## 2018-07-06 LAB — CBC WITH DIFFERENTIAL/PLATELET
Abs Immature Granulocytes: 0.14 10*3/uL — ABNORMAL HIGH (ref 0.00–0.07)
Basophils Absolute: 0 10*3/uL (ref 0.0–0.1)
Basophils Relative: 0 %
Eosinophils Absolute: 0.5 10*3/uL (ref 0.0–0.5)
Eosinophils Relative: 5 %
HCT: 23.5 % — ABNORMAL LOW (ref 36.0–46.0)
Hemoglobin: 6.6 g/dL — ABNORMAL LOW (ref 12.0–15.0)
Immature Granulocytes: 1 %
Lymphocytes Relative: 25 %
Lymphs Abs: 2.5 10*3/uL (ref 0.7–4.0)
MCH: 21.4 pg — ABNORMAL LOW (ref 26.0–34.0)
MCHC: 28.1 g/dL — ABNORMAL LOW (ref 30.0–36.0)
MCV: 76.1 fL — ABNORMAL LOW (ref 80.0–100.0)
Monocytes Absolute: 0.5 10*3/uL (ref 0.1–1.0)
Monocytes Relative: 5 %
Neutro Abs: 6.2 10*3/uL (ref 1.7–7.7)
Neutrophils Relative %: 64 %
Platelets: 522 10*3/uL — ABNORMAL HIGH (ref 150–400)
RBC: 3.09 MIL/uL — ABNORMAL LOW (ref 3.87–5.11)
RDW: 31 % — ABNORMAL HIGH (ref 11.5–15.5)
Smear Review: NORMAL
WBC: 9.8 10*3/uL (ref 4.0–10.5)
nRBC: 3.6 % — ABNORMAL HIGH (ref 0.0–0.2)

## 2018-07-06 LAB — RETIC PANEL
Immature Retic Fract: 11.4 % (ref 2.3–15.9)
RBC.: 3.07 MIL/uL — ABNORMAL LOW (ref 3.87–5.11)
Retic Count, Absolute: 22.7 10*3/uL (ref 19.0–186.0)
Retic Ct Pct: 0.7 % (ref 0.4–3.1)
Reticulocyte Hemoglobin: 16.9 pg — ABNORMAL LOW (ref >27.9)

## 2018-07-06 LAB — IRON AND TIBC
Iron: 11 ug/dL — ABNORMAL LOW (ref 28–170)
Saturation Ratios: 2 % — ABNORMAL LOW (ref 10.4–31.8)
TIBC: 487 ug/dL — ABNORMAL HIGH (ref 250–450)
UIBC: 476 ug/dL

## 2018-07-06 LAB — VITAMIN B12: Vitamin B-12: 198 pg/mL (ref 180–914)

## 2018-07-06 LAB — PREPARE RBC (CROSSMATCH)

## 2018-07-06 LAB — FERRITIN: Ferritin: 6 ng/mL — ABNORMAL LOW (ref 11–307)

## 2018-07-06 MED ORDER — ADULT MULTIVITAMIN W/MINERALS CH
1.0000 | ORAL_TABLET | Freq: Every day | ORAL | Status: DC
  Administered 2018-07-07: 09:00:00 1 via ORAL
  Filled 2018-07-06: qty 1

## 2018-07-06 MED ORDER — SODIUM CHLORIDE 0.9% IV SOLUTION
Freq: Once | INTRAVENOUS | Status: DC
  Filled 2018-07-06: qty 250

## 2018-07-06 MED ORDER — ONDANSETRON HCL 4 MG PO TABS: 4.0000 mg | ORAL_TABLET | Freq: Four times a day (QID) | ORAL | Status: DC | PRN

## 2018-07-06 MED ORDER — SODIUM CHLORIDE 0.9% IV SOLUTION
Freq: Once | INTRAVENOUS | Status: AC
  Administered 2018-07-06: 21:00:00 via INTRAVENOUS

## 2018-07-06 MED ORDER — ACETAMINOPHEN 325 MG PO TABS: 650.0000 mg | ORAL_TABLET | Freq: Four times a day (QID) | ORAL | Status: DC | PRN

## 2018-07-06 MED ORDER — LOSARTAN POTASSIUM 50 MG PO TABS
100.0000 mg | ORAL_TABLET | Freq: Every day | ORAL | Status: DC
  Administered 2018-07-07: 09:00:00 100 mg via ORAL
  Filled 2018-07-06: qty 2

## 2018-07-06 MED ORDER — SODIUM CHLORIDE 0.9 % IV SOLN
400.0000 mg | Freq: Once | INTRAVENOUS | Status: AC
  Administered 2018-07-07: 400 mg via INTRAVENOUS
  Filled 2018-07-06: qty 20

## 2018-07-06 MED ORDER — ACETAMINOPHEN 650 MG RE SUPP: 650.0000 mg | Freq: Four times a day (QID) | RECTAL | Status: DC | PRN

## 2018-07-06 MED ORDER — HYDROCHLOROTHIAZIDE 25 MG PO TABS
25.0000 mg | ORAL_TABLET | Freq: Every day | ORAL | Status: DC
  Administered 2018-07-07: 25 mg via ORAL
  Filled 2018-07-06: qty 1

## 2018-07-06 MED ORDER — FUROSEMIDE 10 MG/ML IJ SOLN
20.0000 mg | Freq: Once | INTRAMUSCULAR | Status: AC
  Administered 2018-07-06: 20 mg via INTRAVENOUS
  Filled 2018-07-06: qty 4

## 2018-07-06 MED ORDER — ONDANSETRON HCL 4 MG/2ML IJ SOLN: 4.0000 mg | Freq: Four times a day (QID) | INTRAMUSCULAR | Status: DC | PRN

## 2018-07-06 MED ORDER — SODIUM CHLORIDE 0.9 % IV SOLN
10.0000 mL/h | Freq: Once | INTRAVENOUS | Status: AC
  Administered 2018-07-06: 10 mL/h via INTRAVENOUS

## 2018-07-06 MED ORDER — ACETAMINOPHEN 325 MG PO TABS
650.0000 mg | ORAL_TABLET | Freq: Once | ORAL | Status: AC
  Administered 2018-07-06: 650 mg via ORAL
  Filled 2018-07-06: qty 2

## 2018-07-06 MED ORDER — METOPROLOL SUCCINATE ER 50 MG PO TB24
100.0000 mg | ORAL_TABLET | Freq: Every day | ORAL | Status: DC
  Administered 2018-07-07: 100 mg via ORAL
  Filled 2018-07-06: qty 2

## 2018-07-06 NOTE — ED Notes (Signed)
ED TO INPATIENT HANDOFF REPORT  ED Nurse Name and Phone #: Porchia Sinkler [phone]  S Name/Age/Gender Madison Lewis 48 y.o. female Room/Bed: ED07A/ED07A  Code Status   Code Status: Full Code  Home/SNF/Other Home Patient oriented to: self, place, time and situation Is this baseline? Yes   Triage Complete: Triage complete  Chief Complaint sent by dr low blood  Triage Note Pt to ED via [place] stating her doctor sent her over for low Hgb. Pt states that the blood work was done at [facility]. Pt had blood transfusion on 06/17/18. Pt states that she is feeling fatigue, having shortness of breath, and has a slight headache. Pt is in NAD. Pt denies blood in her stool or vomit.    Allergies No Known Allergies  Level of Care/Admitting Diagnosis ED Disposition    ED Disposition Condition [place]: [place] [100120]  Level of Care: Med-Surg [16]  Covid Evaluation: Person Under Investigation (PUI)  Isolation Risk Level: Low Risk/Droplet (Less than 4L [place] supplementation)  Diagnosis: Anemia [915056]  Admitting Physician: Loletha Grayer [[id number]]  Attending Physician: Loletha Grayer [phone]  PT Class (Do Not Modify): Observation [104]  PT Acc Code (Do Not Modify): Observation [10022]       B Medical/Surgery History Past Medical History:  Diagnosis Date  . Blood transfusion without reported diagnosis   . Hypertension    Past Surgical History:  Procedure Laterality Date  . CESAREAN SECTION    . COLONOSCOPY WITH PROPOFOL N/A 05/19/2016   Procedure: COLONOSCOPY WITH PROPOFOL;  Surgeon: [name], MD;  Location[facility] ENDOSCOPY;  Service: Endoscopy;  Laterality: N/A;  . ESOPHAGOGASTRODUODENOSCOPY (EGD) WITH PROPOFOL N/A 05/19/2016   Procedure: ESOPHAGOGASTRODUODENOSCOPY (EGD) WITH PROPOFOL;  Surgeon: [name], MD;  Location: [facility] ENDOSCOPY;  Service: Endoscopy;  Laterality: N/A;  . KNEE SURGERY Bilateral      A IV  Location/Drains/Wounds Patient Lines/Drains/Airways Status   Active Line/Drains/Airways    Name:   Placement date:   Placement time:   Site:   Days:   Peripheral IV 07/06/18 Right Forearm   07/06/18    1509    Forearm   less than 1          Intake/Output Last 24 hours  Intake/Output Summary (Last 24 hours) at 07/06/2018 1708 Last data filed at 07/06/2018 1625 Gross per 24 hour  Intake 310 ml  Output -  Net 310 ml    Labs/Imaging Results for orders placed or performed during the hospital encounter of 07/06/18 (from the past 48 hour(s))  CBC with Differential     Status: Abnormal   Collection Time: 07/06/18 10:53 AM  Result Value Ref Range   WBC 9.8 4.0 - 10.5 K/uL   RBC 3.09 (L) 3.87 - 5.11 MIL/uL   Hemoglobin 6.6 (L) 12.0 - 15.0 g/dL    Comment: Reticulocyte Hemoglobin testing may be clinically indicated, consider ordering this additional test [id number]    HCT 23.5 (L) 36.0 - 46.0 %   MCV 76.1 (L) 80.0 - 100.0 fL   MCH 21.4 (L) 26.0 - 34.0 pg   MCHC 28.1 (L) 30.0 - 36.0 g/dL   RDW 31.0 (H) 11.5 - 15.5 %   Platelets 522 (H) 150 - 400 K/uL   nRBC 3.6 (H) 0.0 - 0.2 %   Neutrophils Relative % 64 %   Neutro Abs 6.2 1.7 - 7.7 K/uL   Lymphocytes Relative 25 %   Lymphs Abs 2.5 0.7 - 4.0 K/uL  Monocytes Relative 5 %   Monocytes Absolute 0.5 0.1 - 1.0 K/uL   Eosinophils Relative 5 %   Eosinophils Absolute 0.5 0.0 - 0.5 K/uL   Basophils Relative 0 %   Basophils Absolute 0.0 0.0 - 0.1 K/uL   WBC Morphology MORPHOLOGY UNREMARKABLE    Smear Review Normal platelet morphology    Immature Granulocytes 1 %   Abs Immature Granulocytes 0.14 (H) 0.00 - 0.07 K/uL   Tear Drop Cells PRESENT    Dimorphism PRESENT    Polychromasia PRESENT    Target Cells PRESENT    Spherocytes PRESENT     Comment: Performed at [facility], [address] [place], [place] [address]  Basic metabolic panel     Status: Abnormal   Collection Time: 07/06/18 10:53 AM  Result Value Ref Range    Sodium 137 135 - 145 mmol/L   Potassium 3.6 3.5 - 5.1 mmol/L   Chloride 104 98 - 111 mmol/L   CO2 24 22 - 32 mmol/L   Glucose, Bld 119 (H) 70 - 99 mg/dL   BUN 22 (H) 6 - 20 mg/dL   Creatinine, Ser 1.22 (H) 0.44 - 1.00 mg/dL   Calcium 8.8 (L) 8.9 - 10.3 mg/dL   GFR calc non Af Amer 52 (L) >60 mL/min   GFR calc Af Amer >60 >60 mL/min   Anion gap 9 5 - 15    Comment: Performed at [facility], [address] [place], [place] [address]  Type and screen [place]     Status: None (Preliminary result)   Collection Time: 07/06/18 10:53 AM  Result Value Ref Range   ABO/RH(D) B POS    Antibody Screen NEG    Sample Expiration 07/09/2018,2359    Unit Number [id number]    Blood Component Type RED CELLS,LR    Unit division 00    Status of Unit ISSUED    Transfusion Status OK TO TRANSFUSE    Crossmatch Result      Compatible Performed at [facility], [address] [place], [place] [address]   Ferritin     Status: Abnormal   Collection Time: 07/06/18 10:53 AM  Result Value Ref Range   Ferritin 6 (L) 11 - 307 ng/mL    Comment: Performed at [facility], [address] [place], [place] [address]  Iron and TIBC     Status: Abnormal   Collection Time: 07/06/18 10:53 AM  Result Value Ref Range   Iron 11 (L) 28 - 170 ug/dL   TIBC 487 (H) 250 - 450 ug/dL   Saturation Ratios 2 (L) 10.4 - 31.8 %   UIBC 476 ug/dL    Comment: Performed at [facility], [address] [place], [place] [address]  Retic Panel     Status: Abnormal   Collection Time: 07/06/18 10:53 AM  Result Value Ref Range   Retic Ct Pct 0.7 0.4 - 3.1 %   RBC. 3.07 (L) 3.87 - 5.11 MIL/uL   Retic Count, Absolute 22.7 19.0 - 186.0 K/uL   Immature Retic Fract 11.4 2.3 - 15.9 %   Reticulocyte Hemoglobin 16.9 (L) >27.9 pg    Comment:        A RET-He < 28 pg is an indication of iron-deficient or iron- insufficient erythropoiesis. Patients with thalassemia may  also have a decreased RET-He result unrelated to iron availability.     If this patient has chronic kidney disease and does not have a  hemoglobinopathy he/she meets criteria for iron deficiency per the 2016 NICE guidelines. Refer to specific guidelines to determine the appropriate thresholds for treating CKD- associated iron deficiency. TSAT and ferritin should be used in patients with hemoglobinopathies (e.g. thalassemia). Performed at [facility], [address] [place], [place] [address]   Prepare RBC     Status: None   Collection Time: 07/06/18  3:29 PM  Result Value Ref Range   Order Confirmation      ORDER PROCESSED BY BLOOD BANK Performed at [facility], [address] [place], [place] [address]    No results found.  Pending Labs Unresulted Labs (From admission, onward)    Start     Ordered   07/07/18 5465  Basic metabolic panel  Tomorrow morning,   STAT     07/06/18 1635   07/07/18 0500  CBC  Tomorrow morning,   STAT     07/06/18 1635   07/06/18 1638  Occult blood card to lab, stool RN will collect  Once,   STAT    Question:  Specimen to be collected by?  Answer:  RN will collect   07/06/18 1637   07/06/18 1606  Vitamin B12  Add-on,   AD     07/06/18 1605   07/06/18 1531  Novel Coronavirus,NAA,(SEND-OUT TO REF LAB - TAT 24-48 hrs); Hosp Order  (Asymptomatic Patients Labs)  ONCE - STAT,   STAT    Question:  Rule Out  Answer:  Yes   07/06/18 1530   07/06/18 1053  Pathologist smear review  Once,   STAT     07/06/18 1053          Vitals/Pain Today's Vitals   07/06/18 1543 07/06/18 1544 07/06/18 1625 07/06/18 1647  BP: (!) 129/58  (!) 145/79 (!) 145/76  Pulse:  74 72 72  Resp:   14 14  Temp:   97.8 F (36.6 C) 98 F (36.7 C)  TempSrc:   Oral Oral  SpO2:  99% 100% 100%  PainSc:        Isolation Precautions No active isolations  Medications Medications  0.9 %  sodium chloride infusion (Manually program via Guardrails IV  Fluids) (has no administration in time range)  hydrochlorothiazide (HYDRODIURIL) tablet 25 mg (has no administration in time range)  losartan (COZAAR) tablet 100 mg (has no administration in time range)  metoprolol succinate (TOPROL-XL) 24 hr tablet 100 mg (has no administration in time range)  multivitamin with minerals tablet 1 tablet (has no administration in time range)  acetaminophen (TYLENOL) tablet 650 mg (has no administration in time range)    Or  acetaminophen (TYLENOL) suppository 650 mg (has no administration in time range)  ondansetron (ZOFRAN) tablet 4 mg (has no administration in time range)    Or  ondansetron (ZOFRAN) injection 4 mg (has no administration in time range)  0.9 %  sodium chloride infusion (10 mL/hr Intravenous New Bag/Given 07/06/18 1634)  furosemide (LASIX) injection 20 mg (20 mg Intravenous Given 07/06/18 1641)  acetaminophen (TYLENOL) tablet 650 mg (650 mg Oral Given 07/06/18 1640)    Mobility walks Low fall risk   Focused Assessments Cardiac Assessment Handoff:    No results found for: CKTOTAL, CKMB, CKMBINDEX, TROPONINI No results found for: DDIMER Does the Patient currently have chest pain? No      R Recommendations: See Admitting Provider Note  Report given to:   Additional Notes:

## 2018-07-06 NOTE — ED Provider Notes (Signed)
[facility] Emergency Department Provider Note   ____________________________________________   First MD Initiated Contact with Patient 07/06/18 1516     (approximate)  I have reviewed the triage vital signs and the nursing notes.   HISTORY  Chief Complaint Abnormal Lab    HPI Madison Lewis is a 48 y.o. female returns to the hospital for low blood counts  Patient reports that her doctor checked her blood count, and her blood count is gone down quite a bit again.  She is noticed that she is getting quite short of breath when she tries to walk the stairs in her house, she is feeling a little more fatigued than usual last few days, she has continued to have monthly menstrual cycles and reports at times a little heavy but currently not on her menstrual cycle  Denies pregnancy.  No recent cough cold fevers or chills, except for a chronic cough diagnosed as "allergies" for a year.  No exposure to coronavirus that she knows of.  She did have a couple loose stools earlier in the week that is resolved  Had previous blood transfusion just couple weeks ago was admitted to the hospital for the same.  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   Essential hypertension 03/18/2015    Past Surgical History:  Procedure Laterality Date   CESAREAN SECTION     COLONOSCOPY WITH PROPOFOL N/A 05/19/2016   Procedure: COLONOSCOPY WITH  PROPOFOL;  Surgeon: Midge MiniumWohl, Darren, MD;  Location[facility] ENDOSCOPY;  Service: Endoscopy;  Laterality: N/A;   ESOPHAGOGASTRODUODENOSCOPY (EGD) WITH PROPOFOL N/A 05/19/2016   Procedure: ESOPHAGOGASTRODUODENOSCOPY (EGD) WITH PROPOFOL;  Surgeon: Midge MiniumWohl, Darren, MD;  Location: [facility] ENDOSCOPY;  Service: Endoscopy;  Laterality: N/A;   KNEE SURGERY Bilateral     Prior to Admission medications   Medication Sig Start Date End Date Taking? Authorizing Provider  hydrochlorothiazide (HYDRODIURIL) 25 MG tablet Take 1 tablet (25 mg total) by mouth daily for 30 days. 06/16/18 07/16/18  Ihor AustinPyreddy, Pavan, MD  losartan (COZAAR) 100 MG tablet Take 1 tablet (100 mg total) by mouth daily for 30 days. 06/16/18 07/16/18  Ihor AustinPyreddy, Pavan, MD  metoprolol succinate (TOPROL-XL) 100 MG 24 hr tablet Take 1 tablet (100 mg total) by mouth daily for 30 days. Take with or immediately following a meal. 06/16/18 07/16/18  Ihor AustinPyreddy, Pavan, MD  Multiple Vitamin (MULTIVITAMIN WITH MINERALS) TABS tablet Take 1 tablet by mouth daily. 05/20/16   Ramonita LabGouru, Aruna, MD    Allergies Patient has no known allergies.  Family History  Problem Relation Age of Onset   Hypertension Father    Breast cancer Neg Hx     Social History Social History   Tobacco Use   Smoking status: Never Smoker   Smokeless tobacco: Never Used  Substance Use Topics   Alcohol use: Yes    Alcohol/week: 1.0 standard drinks    Types: 1 Cans of beer per week   Drug use: No  Review of Systems Constitutional: No fever/chills Eyes: No visual changes. ENT: No sore throat. Cardiovascular: Denies chest pain. Respiratory: Shortness of breath with exertion  gastrointestinal: No abdominal pain.   Genitourinary: Negative for dysuria. Musculoskeletal: Negative for back pain. Skin: Negative for rash. Neurological: Negative for headaches, areas of focal weakness or numbness.    ____________________________________________   PHYSICAL EXAM:  VITAL SIGNS: ED Triage  Vitals  Enc Vitals Group     BP 07/06/18 1037 (!) 147/92     Pulse Rate 07/06/18 1037 86     Resp 07/06/18 1037 16     Temp 07/06/18 1037 99 F (37.2 C)     Temp Source 07/06/18 1037 Oral     SpO2 07/06/18 1037 97 %     Weight --      Height --      Head Circumference --      Peak Flow --      Pain Score 07/06/18 1044 0     Pain Loc --      Pain Edu? --      Excl. in GC? --    Constitutional: Alert and oriented. Well appearing and in no acute distress. Eyes: Conjunctivae are normal. Head: Atraumatic. Nose: No congestion/rhinnorhea. Mouth/Throat: Mucous membranes are moist. Neck: No stridor.  Cardiovascular: Normal rate, regular rhythm. Grossly normal heart sounds.  Good peripheral circulation. Respiratory: Normal respiratory effort.  No retractions. Lungs CTAB. Gastrointestinal: Soft and nontender. No distention. Musculoskeletal: No lower extremity tenderness nor edema. Neurologic:  Normal speech and language. No gross focal neurologic deficits are appreciated.  Skin:  Skin is warm, dry and intact. No rash noted. Psychiatric: Mood and affect are normal. Speech and behavior are normal.  ____________________________________________   LABS (all labs ordered are listed, but only abnormal results are displayed)  Labs Reviewed  CBC WITH DIFFERENTIAL/PLATELET - Abnormal; Notable for the following components:      Result Value   RBC 3.09 (*)    Hemoglobin 6.6 (*)    HCT 23.5 (*)    MCV 76.1 (*)    MCH 21.4 (*)    MCHC 28.1 (*)    RDW 31.0 (*)    Platelets 522 (*)    nRBC 3.6 (*)    Abs Immature Granulocytes 0.14 (*)    All other components within normal limits  BASIC METABOLIC PANEL - Abnormal; Notable for the following components:   Glucose, Bld 119 (*)    BUN 22 (*)    Creatinine, Ser 1.22 (*)    Calcium 8.8 (*)    GFR calc non Af Amer 52 (*)    All other components within normal limits  NOVEL CORONAVIRUS, NAA (HOSPITAL ORDER, SEND-OUT TO REF LAB)  PATHOLOGIST  SMEAR REVIEW  TYPE AND SCREEN  PREPARE RBC (CROSSMATCH)   ____________________________________________  EKG  Reviewed entered by me at 1600 Heart rate 70 QRS 100 QTc 440 Normal sinus rhythm, no evidence of acute ischemia though some nonspecific T wave abnormality is noted ____________________________________________  RADIOLOGY   ____________________________________________   PROCEDURES  Procedure(s) performed: None  Procedures  Critical Care performed: Yes  CRITICAL CARE Performed by: Sharyn CreamerMark Kaydan Wong   Total critical care time: 26 minutes  Critical care time was exclusive of separately billable procedures and treating other patients.  Critical care was necessary to treat or prevent imminent or life-threatening deterioration.  Critical care was time spent personally by me on the following activities: development of treatment plan with patient and/or surrogate as well as  nursing, discussions with consultants, evaluation of patient's response to treatment, examination of patient, obtaining history from patient or surrogate, ordering and performing treatments and interventions, ordering and review of laboratory studies, ordering and review of radiographic studies, pulse oximetry and re-evaluation of patient's condition.  Hemoglobin less than 7, transfusion required ____________________________________________   INITIAL IMPRESSION / ASSESSMENT AND PLAN / ED COURSE  Pertinent labs & imaging results that were available during my care of the patient were reviewed by me and considered in my medical decision making (see chart for details).   Patient notes for evaluation of low blood count.  Referred by her doctor to the emergency department for evaluation.  Similar couple weeks ago, she appears to again have developed anemia.  From her description she denies blood in her stool, no black or bloody stool.  She did see small amounts of blood in her stool a few weeks ago but none since  leaving the hospital.  She is been seen by GI in the suspect this is more likely related to gynecologic etiology  We will order a unit of blood, discussed risks and benefits with the patient.  She verbally consents and we will have her sign consent form as well.  Blood transfusion ordered.  Discussed case with hospitalist, will admit the patient because of her symptomatic anemia and critically low hemoglobin less than 7 once again.    To believe the patient would benefit from an inpatient hematology consultation.  Patient agreeable understanding of plan for admission.  Madison Lewis was evaluated in Emergency Department on 07/06/2018 for the symptoms described in the history of present illness. She was evaluated in the context of the global COVID-19 pandemic, which necessitated consideration that the patient might be at risk for infection with the SARS-CoV-2 virus that causes COVID-19. Institutional protocols and algorithms that pertain to the evaluation of patients at risk for COVID-19 are in a state of rapid change based on information released by regulatory bodies including the CDC and federal and state organizations. These policies and algorithms were followed during the patient's care in the ED.  Vitals:   07/06/18 1700 07/06/18 1730  BP: 137/82   Pulse: 74 73  Resp: (!) 21 15  Temp:    SpO2: 100% 100%    ____________________________________________   FINAL CLINICAL IMPRESSION(S) / ED DIAGNOSES  Final diagnoses:  Symptomatic anemia        Note:  This document was prepared using Dragon voice recognition software and may include unintentional dictation errors       Delman Kitten, MD 07/06/18 1806

## 2018-07-06 NOTE — ED Notes (Signed)
Signature pad in room does not work- printed blood consent form to have pt sign

## 2018-07-06 NOTE — H&P (Signed)
[facility] NAME: Madison Lewis    MR#:  [id number]  DATE OF BIRTH:  04-28-1970  DATE OF ADMISSION:  07/06/2018  PRIMARY CARE PHYSICIAN: Gearldine Shown, DO   REQUESTING/REFERRING PHYSICIAN: Dr Delman Kitten  CHIEF COMPLAINT:   Chief Complaint  Patient presents with  . Abnormal Lab    HISTORY OF PRESENT ILLNESS:  Madison Lewis  is a 48 y.o. female with a known history of anemia.  She followed with Dr. [name] on Thursday and they rechecked the blood and she was anemic and she was told to go to the hospital on Friday when they called her.  She could not come to the hospital on Friday and came today and still found to be anemic.  She denies any blood loss in the GI tract with respect to dark stools or bright red blood per rectum.  He does have some heavy menses.  She also eats a lot of ice.  Hospitalist services were contacted for further evaluation.  She was supposed to have a capsule endoscopy by Dr. [name] on Wednesday.  Patient complains of fatigue, shortness of breath and occasional chest pain.  Also having some low back pain  PAST MEDICAL HISTORY:   Past Medical History:  Diagnosis Date  . Blood transfusion without reported diagnosis   . Hypertension     PAST SURGICAL HISTORY:   Past Surgical History:  Procedure Laterality Date  . CESAREAN SECTION    . COLONOSCOPY WITH PROPOFOL N/A 05/19/2016   Procedure: COLONOSCOPY WITH PROPOFOL;  Surgeon: [name], MD;  Location: [facility] ENDOSCOPY;  Service: Endoscopy;  Laterality: N/A;  . ESOPHAGOGASTRODUODENOSCOPY (EGD) WITH PROPOFOL N/A 05/19/2016   Procedure: ESOPHAGOGASTRODUODENOSCOPY (EGD) WITH PROPOFOL;  Surgeon: [name], MD;  Location: [facility] ENDOSCOPY;  Service: Endoscopy;  Laterality: N/A;  . KNEE SURGERY Bilateral     SOCIAL HISTORY:   Social History   Tobacco Use  . Smoking status: Never Smoker  . Smokeless tobacco: Never Used  Substance Use  Topics  . Alcohol use: Yes    Alcohol/week: 1.0 standard drinks    Types: 1 Cans of beer per week    FAMILY HISTORY:   Family History  Problem Relation Age of Onset  . Hypertension Father   . Pancreatic cancer Father   . CAD Mother   . Breast cancer Neg Hx     DRUG ALLERGIES:  No Known Allergies  REVIEW OF SYSTEMS:  CONSTITUTIONAL: No fever, fatigue or weakness.  EYES: No blurred or double vision.  EARS, NOSE, AND THROAT: No tinnitus or ear pain. No sore throat RESPIRATORY: No cough.  Some shortness of breath.  No wheezing or hemoptysis.  CARDIOVASCULAR: Occasional chest pain.no orthopnea, edema.  GASTROINTESTINAL: Some nausea, vomiting on Tuesday.  No diarrhea or abdominal pain. No blood in bowel movements GENITOURINARY: No dysuria, hematuria.  ENDOCRINE: No polyuria, nocturia,  HEMATOLOGY: History of anemia.no easy bruising or bleeding SKIN: No rash or lesion. MUSCULOSKELETAL: No joint pain or arthritis.   NEUROLOGIC: No tingling, numbness, weakness.  PSYCHIATRY: No anxiety or depression.   MEDICATIONS AT HOME:   Prior to Admission medications   Medication Sig Start Date End Date Taking? Authorizing Provider  hydrochlorothiazide (HYDRODIURIL) 25 MG tablet Take 1 tablet (25 mg total) by mouth daily for 30 days. 06/16/18 07/16/18  Saundra Shelling, MD  losartan (COZAAR) 100 MG tablet Take 1 tablet (100 mg total) by mouth daily for 30 days. 06/16/18 07/16/18  Pyreddy, Reatha Harps,  MD  metoprolol succinate (TOPROL-XL) 100 MG 24 hr tablet Take 1 tablet (100 mg total) by mouth daily for 30 days. Take with or immediately following a meal. 06/16/18 07/16/18  Ihor AustinPyreddy, Pavan, MD  Multiple Vitamin (MULTIVITAMIN WITH MINERALS) TABS tablet Take 1 tablet by mouth daily. 05/20/16   Ramonita LabGouru, Aruna, MD      VITAL SIGNS:  Blood pressure (!) 145/79, pulse 72, temperature 97.8 F (36.6 C), temperature source Oral, resp. rate 14, SpO2 100 %.  PHYSICAL EXAMINATION:  GENERAL:  48 y.o.-year-old patient  lying in the bed with no acute distress.  EYES: Pupils equal, round, reactive to light and accommodation. No scleral icterus. Extraocular muscles intact.  HEENT: Head atraumatic, normocephalic. Oropharynx and nasopharynx clear.  NECK:  Supple, no jugular venous distention. No thyroid enlargement, no tenderness.  LUNGS: Normal breath sounds bilaterally, no wheezing, rales,rhonchi or crepitation. No use of accessory muscles of respiration.  CARDIOVASCULAR: S1, S2 normal. No murmurs, rubs, or gallops.  ABDOMEN: Soft, nontender, nondistended. Bowel sounds present. No organomegaly or mass.  EXTREMITIES: No pedal edema, cyanosis, or clubbing.  NEUROLOGIC: Cranial nerves II through XII are intact. Muscle strength 5/5 in all extremities. Sensation intact. Gait not checked.  PSYCHIATRIC: The patient is alert and oriented x 3.  SKIN: No rash, lesion, or ulcer.   LABORATORY PANEL:   CBC Recent Labs  Lab 07/06/18 1053  WBC 9.8  HGB 6.6*  HCT 23.5*  PLT 522*   ------------------------------------------------------------------------------------------------------------------  Chemistries  Recent Labs  Lab 07/06/18 1053  NA 137  K 3.6  CL 104  CO2 24  GLUCOSE 119*  BUN 22*  CREATININE 1.22*  CALCIUM 8.8*   ------------------------------------------------------------------------------------------------------------------    IMPRESSION AND PLAN:   1.  Symptomatic anemia.  Transfuse 2 units of packed red blood cells.  Check iron studies.  If ferritin is low I will also give IV iron.  We will get gynecology consultation for heavy menses.  Advised her to stop eating ice.  Patient follows with Dr. Norma Fredricksonoledo as outpatient and will have a capsule endoscopy on Wednesday. 2.  Pica.  Advised to stop eating ice 3.  Heavy menses we will get a pelvic ultrasound.  Gynecology consultation. 4.  Hypertension.  Continue usual medications    All the records are reviewed and case discussed with ED  provider. Management plans discussed with the patient, family and they are in agreement.  CODE STATUS: Full code  TOTAL TIME TAKING CARE OF THIS PATIENT: 50 minutes.    Alford Highlandichard Ronen Bromwell M.D on 07/06/2018 at 4:36 PM  Between 7am to 6pm - Pager - [phone]  After 6pm call admission pager [phone]  [facility]  [phone]  CC: Primary care physician; Cyndie MullSantayana, Gloria Patricia, DO

## 2018-07-06 NOTE — ED Notes (Signed)
MD at bedside. 

## 2018-07-06 NOTE — ED Triage Notes (Signed)
Pt to ED via POV stating her doctor sent her over for low Hgb. Pt states that the blood work was done at [facility]. Pt had blood transfusion on 06/17/18. Pt states that she is feeling fatigue, having shortness of breath, and has a slight headache. Pt is in NAD. Pt denies blood in her stool or vomit.

## 2018-07-07 ENCOUNTER — Observation Stay: Payer: BC Managed Care – PPO

## 2018-07-07 DIAGNOSIS — N938 Other specified abnormal uterine and vaginal bleeding: Secondary | ICD-10-CM | POA: Diagnosis not present

## 2018-07-07 DIAGNOSIS — N920 Excessive and frequent menstruation with regular cycle: Secondary | ICD-10-CM | POA: Diagnosis not present

## 2018-07-07 LAB — CBC
HCT: 30 % — ABNORMAL LOW (ref 36.0–46.0)
Hemoglobin: 9 g/dL — ABNORMAL LOW (ref 12.0–15.0)
MCH: 23.3 pg — ABNORMAL LOW (ref 26.0–34.0)
MCHC: 30 g/dL (ref 30.0–36.0)
MCV: 77.7 fL — ABNORMAL LOW (ref 80.0–100.0)
Platelets: 498 10*3/uL — ABNORMAL HIGH (ref 150–400)
RBC: 3.86 MIL/uL — ABNORMAL LOW (ref 3.87–5.11)
RDW: 26.1 % — ABNORMAL HIGH (ref 11.5–15.5)
WBC: 8.4 10*3/uL (ref 4.0–10.5)
nRBC: 2 % — ABNORMAL HIGH (ref 0.0–0.2)

## 2018-07-07 LAB — BASIC METABOLIC PANEL
Anion gap: 7 (ref 5–15)
BUN: 23 mg/dL — ABNORMAL HIGH (ref 6–20)
CO2: 26 mmol/L (ref 22–32)
Calcium: 8.7 mg/dL — ABNORMAL LOW (ref 8.9–10.3)
Chloride: 104 mmol/L (ref 98–111)
Creatinine, Ser: 1.15 mg/dL — ABNORMAL HIGH (ref 0.44–1.00)
GFR calc Af Amer: >60 mL/min (ref >60)
GFR calc non Af Amer: 56 mL/min — ABNORMAL LOW (ref >60)
Glucose, Bld: 91 mg/dL (ref 70–99)
Potassium: 4 mmol/L (ref 3.5–5.1)
Sodium: 137 mmol/L (ref 135–145)

## 2018-07-07 LAB — BPAM RBC
Blood Product Expiration Date: 202007162359
Blood Product Expiration Date: 202007162359
ISSUE DATE / TIME: 202006201612
ISSUE DATE / TIME: 202006202135
Unit Type and Rh: 7300
Unit Type and Rh: 7300

## 2018-07-07 LAB — TYPE AND SCREEN
ABO/RH(D): B POS
Antibody Screen: NEGATIVE
Unit division: 0
Unit division: 0

## 2018-07-07 MED ORDER — LABETALOL HCL 5 MG/ML IV SOLN
10.0000 mg | Freq: Once | INTRAVENOUS | Status: AC
  Administered 2018-07-07: 02:00:00 10 mg via INTRAVENOUS
  Filled 2018-07-07: qty 4

## 2018-07-07 MED ORDER — CYANOCOBALAMIN 1000 MCG/ML IJ SOLN
1000.0000 ug | Freq: Once | INTRAMUSCULAR | Status: AC
  Administered 2018-07-07: 1000 ug via INTRAMUSCULAR
  Filled 2018-07-07: qty 1

## 2018-07-07 NOTE — Progress Notes (Signed)
Pt d/c to home via self. IV removed intact. VSS. Education completed. All belongings sent with pt. All questions answered.

## 2018-07-07 NOTE — Consult Note (Signed)
Reason for Consult: Menorrhagia and abnormal uterine bleedint Referring Physician: Dr. Tonny Bollman Madison Lewis is an 48 y.o. female.  HPI: She is feeling well today. She reports that she was admitted yesterday after finding that her hemoglobin was 6.0. She has received two units of blood and IV iron and is feeling better. Her hemoglobin is 9.0 this morning. She would like to be discharged.   She reports that this is the second time recently that she has had to have a blood transfusion because of severe anemia. She takes an iron supplement at home generally one tablet a day. She would like to no longer have to receive blood transfusions related to anemia.   She was recently evaluated by GI for bleeding. She underwent a colonoscopy and an EGD 2 years ago which were unremarkable. They suggested gynecology referral as well as evaluation for blood dyscrasias  She reports that her most recent period in June was significantly heavier than periods in the past. She had bleeding for 7 days and while at work could tell that the heavy bleeding was making her tired. She feels that in general her periods are not heavy.    Gynecological History Menarche: 10 Menopause: not applicable Last pap smear: 2018 by PCP at [facility] per patient Last mammogram: 2018 B[facility] 1 Last menstrual period: 07/01/2018 Contraception: Tubal ligation Reports regular monthly menses usually lasting 7 days. She says the first 3 days are generally heavier. She denies passage of clots. States that she sometimes will use a pad and tampon. She does not feel like her menstrual bleeding has changed although this most recent period was heavier than others and she reports that she could tell while she was at work that the bleeding was making her tired. She has had accidents and had to stay home from work in  The past although this is not a regular occurrence. She has not been on medications or had prior treatments to address heavy bleeding.  She is  not currently sexually active, reports last intercourse more than 1 year ago.  Denies a history of endometriosis or fibroids.  Reports a history of an ovarian cyst which was laparoscopically removed because "it was the size of a grapefruit." She reports that this took place in the early 2000s She denies painful periods She reports a remote history of STDs : Chlamydia as a teen  Obstetrical History 02/14/1985 SVD, female 01/08/1991 SVD, female 08/12/1995 LTCS and BTL, emergent   Past Medical History:  Diagnosis Date  . Blood transfusion without reported diagnosis   . Hypertension     Past Surgical History:  Procedure Laterality Date  . CESAREAN SECTION    . COLONOSCOPY WITH PROPOFOL N/A 05/19/2016   Procedure: COLONOSCOPY WITH PROPOFOL;  Surgeon: [name], MD;  Location: [facility] ENDOSCOPY;  Service: Endoscopy;  Laterality: N/A;  . ESOPHAGOGASTRODUODENOSCOPY (EGD) WITH PROPOFOL N/A 05/19/2016   Procedure: ESOPHAGOGASTRODUODENOSCOPY (EGD) WITH PROPOFOL;  Surgeon: [name], MD;  Location: [facility] ENDOSCOPY;  Service: Endoscopy;  Laterality: N/A;  . KNEE SURGERY Bilateral     Family History  Problem Relation Age of Onset  . Hypertension Father   . Pancreatic cancer Father   . CAD Mother   . Breast cancer Neg Hx     Social History:  reports that she has never smoked. She has never used smokeless tobacco. She reports current alcohol use of about 1.0 standard drinks of alcohol per week. She reports that she does not use drugs. I have reviewed the patient's  current medications. Allergies: No Known Allergies  Medications:   Results for orders placed or performed during the hospital encounter of 07/06/18 (from the past 48 hour(s))  CBC with Differential     Status: Abnormal   Collection Time: 07/06/18 10:53 AM  Result Value Ref Range   WBC 9.8 4.0 - 10.5 K/uL   RBC 3.09 (L) 3.87 - 5.11 MIL/uL   Hemoglobin 6.6 (L) 12.0 - 15.0 g/dL    Comment: Reticulocyte Hemoglobin testing may be  clinically indicated, consider ordering this additional test [id number]    HCT 23.5 (L) 36.0 - 46.0 %   MCV 76.1 (L) 80.0 - 100.0 fL   MCH 21.4 (L) 26.0 - 34.0 pg   MCHC 28.1 (L) 30.0 - 36.0 g/dL   RDW 31.0 (H) 11.5 - 15.5 %   Platelets 522 (H) 150 - 400 K/uL   nRBC 3.6 (H) 0.0 - 0.2 %   Neutrophils Relative % 64 %   Neutro Abs 6.2 1.7 - 7.7 K/uL   Lymphocytes Relative 25 %   Lymphs Abs 2.5 0.7 - 4.0 K/uL   Monocytes Relative 5 %   Monocytes Absolute 0.5 0.1 - 1.0 K/uL   Eosinophils Relative 5 %   Eosinophils Absolute 0.5 0.0 - 0.5 K/uL   Basophils Relative 0 %   Basophils Absolute 0.0 0.0 - 0.1 K/uL   WBC Morphology MORPHOLOGY UNREMARKABLE    Smear Review Normal platelet morphology    Immature Granulocytes 1 %   Abs Immature Granulocytes 0.14 (H) 0.00 - 0.07 K/uL   Tear Drop Cells PRESENT    Dimorphism PRESENT    Polychromasia PRESENT    Target Cells PRESENT    Spherocytes PRESENT     Comment: Performed at [facility], [address] [place], [place] [address]  Basic metabolic panel     Status: Abnormal   Collection Time: 07/06/18 10:53 AM  Result Value Ref Range   Sodium 137 135 - 145 mmol/L   Potassium 3.6 3.5 - 5.1 mmol/L   Chloride 104 98 - 111 mmol/L   CO2 24 22 - 32 mmol/L   Glucose, Bld 119 (H) 70 - 99 mg/dL   BUN 22 (H) 6 - 20 mg/dL   Creatinine, Ser 1.22 (H) 0.44 - 1.00 mg/dL   Calcium 8.8 (L) 8.9 - 10.3 mg/dL   GFR calc non Af Amer 52 (L) >60 mL/min   GFR calc Af Amer >60 >60 mL/min   Anion gap 9 5 - 15    Comment: Performed at [facility], [address] [place], [place] [address]  Type and screen [place]     Status: None   Collection Time: 07/06/18 10:53 AM  Result Value Ref Range   ABO/RH(D) B POS    Antibody Screen NEG    Sample Expiration 07/09/2018,2359    Unit Number [id number]    Blood Component Type RED CELLS,LR    Unit division 00    Status of Unit ISSUED,FINAL    Transfusion Status OK TO  TRANSFUSE    Crossmatch Result Compatible    Unit Number [id number]    Blood Component Type RED CELLS,LR    Unit division 00    Status of Unit ISSUED,FINAL    Transfusion Status OK TO TRANSFUSE    Crossmatch Result      Compatible Performed at [facility], [place], [place] [address]   Ferritin     Status: Abnormal   Collection  Time: 07/06/18 10:53 AM  Result Value Ref Range   Ferritin 6 (L) 11 - 307 ng/mL    Comment: Performed at [facility], [address] [place], [place] [address]  Iron and TIBC     Status: Abnormal   Collection Time: 07/06/18 10:53 AM  Result Value Ref Range   Iron 11 (L) 28 - 170 ug/dL   TIBC 487 (H) 250 - 450 ug/dL   Saturation Ratios 2 (L) 10.4 - 31.8 %   UIBC 476 ug/dL    Comment: Performed at [facility], [address] [place], [place] [address]  Vitamin B12     Status: None   Collection Time: 07/06/18 10:53 AM  Result Value Ref Range   Vitamin B-12 198 180 - 914 pg/mL    Comment: (NOTE) This assay is not validated for testing neonatal or myeloproliferative syndrome specimens for Vitamin B12 levels. Performed at [facility], [place], [place], [place] [address]   Retic Panel     Status: Abnormal   Collection Time: 07/06/18 10:53 AM  Result Value Ref Range   Retic Ct Pct 0.7 0.4 - 3.1 %   RBC. 3.07 (L) 3.87 - 5.11 MIL/uL   Retic Count, Absolute 22.7 19.0 - 186.0 K/uL   Immature Retic Fract 11.4 2.3 - 15.9 %   Reticulocyte Hemoglobin 16.9 (L) >27.9 pg    Comment:        A RET-He < 28 pg is an indication of iron-deficient or iron- insufficient erythropoiesis. Patients with thalassemia may also have a decreased RET-He result unrelated to iron availability.     If this patient has chronic kidney disease and does not have a hemoglobinopathy he/she meets criteria for iron deficiency per the 2016 NICE guidelines. Refer to specific guidelines to determine the  appropriate thresholds for treating CKD- associated iron deficiency. TSAT and ferritin should be used in patients with hemoglobinopathies (e.g. thalassemia). Performed at [facility], [address] [place], [place] [address]   Prepare RBC     Status: None   Collection Time: 07/06/18  3:29 PM  Result Value Ref Range   Order Confirmation      ORDER PROCESSED BY BLOOD BANK Performed at [facility], [address] [place], [place] [address]   Prepare RBC     Status: None   Collection Time: 07/06/18  9:14 PM  Result Value Ref Range   Order Confirmation      ORDER PROCESSED BY BLOOD BANK Performed at [facility], [address] [place], [place] [address]   Basic metabolic panel     Status: Abnormal   Collection Time: 07/07/18  4:14 AM  Result Value Ref Range   Sodium 137 135 - 145 mmol/L   Potassium 4.0 3.5 - 5.1 mmol/L    Comment: HEMOLYSIS AT THIS LEVEL MAY AFFECT RESULT   Chloride 104 98 - 111 mmol/L   CO2 26 22 - 32 mmol/L   Glucose, Bld 91 70 - 99 mg/dL   BUN 23 (H) 6 - 20 mg/dL   Creatinine, Ser 1.15 (H) 0.44 - 1.00 mg/dL   Calcium 8.7 (L) 8.9 - 10.3 mg/dL   GFR calc non Af Amer 56 (L) >60 mL/min   GFR calc Af Amer >60 >60 mL/min   Anion gap 7 5 - 15    Comment: Performed at [facility], [address], [place], [place] [address]  CBC     Status: Abnormal  Collection Time: 07/07/18  4:14 AM  Result Value Ref Range   WBC 8.4 4.0 - 10.5 K/uL   RBC 3.86 (L) 3.87 - 5.11 MIL/uL   Hemoglobin 9.0 (L) 12.0 - 15.0 g/dL    Comment: REPEATED TO VERIFY POST TRANSFUSION SPECIMEN    HCT 30.0 (L) 36.0 - 46.0 %   MCV 77.7 (L) 80.0 - 100.0 fL   MCH 23.3 (L) 26.0 - 34.0 pg   MCHC 30.0 30.0 - 36.0 g/dL   RDW 26.1 (H) 11.5 - 15.5 %   Platelets 498 (H) 150 - 400 K/uL   nRBC 2.0 (H) 0.0 - 0.2 %    Comment: Performed at [facility], [address] [place], [place] [address]    No results found.  Review of Systems   Constitutional: Negative for chills, fever, malaise/fatigue and weight loss.  HENT: Negative for congestion, hearing loss and sinus pain.   Eyes: Negative for blurred vision and double vision.  Respiratory: Negative for cough, sputum production, shortness of breath and wheezing.   Cardiovascular: Negative for chest pain, palpitations, orthopnea and leg swelling.  Gastrointestinal: Negative for abdominal pain, constipation, diarrhea, nausea and vomiting.  Genitourinary: Negative for dysuria, flank pain, frequency, hematuria and urgency.  Musculoskeletal: Negative for back pain, falls and joint pain.  Skin: Negative for itching and rash.  Neurological: Negative for dizziness and headaches.  Psychiatric/Behavioral: Negative for depression, substance abuse and suicidal ideas. The patient is not nervous/anxious.    Blood pressure (!) 150/92, pulse 81, temperature 98.3 F (36.8 C), temperature source Oral, resp. rate 20, height 5' 4"  (1.626 m), weight 102.9 kg, SpO2 93 %. Physical Exam  Nursing note and vitals reviewed. Constitutional: She is oriented to person, place, and time. She appears well-developed and well-nourished.  HENT:  Head: Normocephalic and atraumatic.  Cardiovascular: Normal rate and regular rhythm.  Respiratory: Effort normal and breath sounds normal.  GI: Soft. Bowel sounds are normal.  Genitourinary:    Genitourinary Comments: Will perform pelvic examination in office as part of follow up.    Musculoskeletal: Normal range of motion.  Neurological: She is alert and oriented to person, place, and time.  Skin: Skin is warm and dry.  Psychiatric: She has a normal mood and affect. Her behavior is normal. Judgment and thought content normal.    Assessment/Plan: 48 yo with menorrhagia and symptomatic anemia requiring blood transfusions Discussed with patient options for treatment and evaluation including miedical and surgical options. After a detailed discussion and review of  treatment option she is most interested in an endometrial ablation. Discussed that this can be schedules as an outpatient. She will follow up in the next 1-2 weeks for and examination and to schedule this procedure.   I would also recommend testing for blood dyscrasia and coagulopathies given that she reports no change in the heaviness of her menstrual pattern. She is following with hematology and this   More than 30 minutes were spent face to face with the patient in the room with more than 50% of the time spent providing counseling and discussing the plan of management.     R  07/07/2018, 10:06 AM

## 2018-07-07 NOTE — Discharge Summary (Signed)
[facility] - [place] at [facility]   PATIENT NAME: Madison Lewis    MR#:  [id number]  DATE OF BIRTH:  02/27/1970  DATE OF ADMISSION:  07/06/2018 ADMITTING PHYSICIAN: Alford Highlandichard Wieting, MD  DATE OF DISCHARGE: No discharge date for patient encounter.  PRIMARY CARE PHYSICIAN: Cyndie MullSantayana, Gloria Patricia, DO    ADMISSION DIAGNOSIS:  Heavy menses [N92.0] Symptomatic anemia [D64.9]  DISCHARGE DIAGNOSIS:  Active Problems:   Anemia   SECONDARY DIAGNOSIS:   Past Medical History:  Diagnosis Date  . Blood transfusion without reported diagnosis   . Hypertension     HOSPITAL COURSE:  *Acute recurrent symptomatic anemia Resolved status post 2 unit packed red blood cell transfusion, did receive IV iron while in house, gastroneurology did see patient while in house, pelvic ultrasound noted for mild endometrial thickening, anemia work-up consistent with iron deficiency/low B12 level, he received B12 injection while in house, advised patient to follow-up with Dr. Norma Fredricksonoledo as outpatient for planned capsule endoscopy on Wednesday  *Pica Advised to stop eating ice  *Menorrhagia  Pelvic ultrasound noted for mild endometrial thickening, gynecology did see patient while in house-arrange for outpatient follow-up status post discharge   *Hypertension Stable on home regiment  DISCHARGE CONDITIONS:   stable  CONSULTS OBTAINED:    DRUG ALLERGIES:  No Known Allergies  DISCHARGE MEDICATIONS:   Allergies as of 07/07/2018   No Known Allergies     Medication List    TAKE these medications   ferrous sulfate 325 (65 FE) MG tablet Take 325 mg by mouth daily with breakfast.   hydrochlorothiazide 25 MG tablet Commonly known as: HYDRODIURIL Take 1 tablet (25 mg total) by mouth daily for 30 days.   losartan 100 MG tablet Commonly known as: COZAAR Take 1 tablet (100 mg total) by mouth daily for 30 days.   metoprolol succinate 100 MG 24 hr tablet Commonly known  as: TOPROL-XL Take 1 tablet (100 mg total) by mouth daily for 30 days. Take with or immediately following a meal.   multivitamin with minerals Tabs tablet Take 1 tablet by mouth daily.        DISCHARGE INSTRUCTIONS:     If you experience worsening of your admission symptoms, develop shortness of breath, life threatening emergency, suicidal or homicidal thoughts you must seek medical attention immediately by calling 911 or calling your MD immediately  if symptoms less severe.  You Must read complete instructions/literature along with all the possible adverse reactions/side effects for all the Medicines you take and that have been prescribed to you. Take any new Medicines after you have completely understood and accept all the possible adverse reactions/side effects.   Please note  You were cared for by a hospitalist during your hospital stay. If you have any questions about your discharge medications or the care you received while you were in the hospital after you are discharged, you can call the unit and asked to speak with the hospitalist on call if the hospitalist that took care of you is not available. Once you are discharged, your primary care physician will handle any further medical issues. Please note that NO REFILLS for any discharge medications will be authorized once you are discharged, as it is imperative that you return to your primary care physician (or establish a relationship with a primary care physician if you do not have one) for your aftercare needs so that they can reassess your need for medications and monitor your lab values.    Today  CHIEF COMPLAINT:   Chief Complaint  Patient presents with  . Abnormal Lab    HISTORY OF PRESENT ILLNESS:  48 y.o. female with a known history of anemia.  She followed with Dr. Norma Fredricksonoledo on Thursday and they rechecked the blood and she was anemic and she was told to go to the hospital on Friday when they called her.  She could not  come to the hospital on Friday and came today and still found to be anemic.  She denies any blood loss in the GI tract with respect to dark stools or bright red blood per rectum.  He does have some heavy menses.  She also eats a lot of ice.  Hospitalist services were contacted for further evaluation.  She was supposed to have a capsule endoscopy by Dr. Norma Fredricksonoledo on Wednesday.  Patient complains of fatigue, shortness of breath and occasional chest pain.  Also having some low back pain  VITAL SIGNS:  Blood pressure (!) 150/92, pulse 81, temperature 98.3 F (36.8 C), temperature source Oral, resp. rate 20, height 5\' 4"  (1.626 m), weight 102.9 kg, SpO2 93 %.  I/O:    Intake/Output Summary (Last 24 hours) at 07/07/2018 1044 Last data filed at 07/07/2018 0615 Gross per 24 hour  Intake 1275.4 ml  Output -  Net 1275.4 ml    PHYSICAL EXAMINATION:  GENERAL:  48 y.o.-year-old patient lying in the bed with no acute distress.  EYES: Pupils equal, round, reactive to light and accommodation. No scleral icterus. Extraocular muscles intact.  HEENT: Head atraumatic, normocephalic. Oropharynx and nasopharynx clear.  NECK:  Supple, no jugular venous distention. No thyroid enlargement, no tenderness.  LUNGS: Normal breath sounds bilaterally, no wheezing, rales,rhonchi or crepitation. No use of accessory muscles of respiration.  CARDIOVASCULAR: S1, S2 normal. No murmurs, rubs, or gallops.  ABDOMEN: Soft, non-tender, non-distended. Bowel sounds present. No organomegaly or mass.  EXTREMITIES: No pedal edema, cyanosis, or clubbing.  NEUROLOGIC: Cranial nerves II through XII are intact. Muscle strength 5/5 in all extremities. Sensation intact. Gait not checked.  PSYCHIATRIC: The patient is alert and oriented x 3.  SKIN: No obvious rash, lesion, or ulcer.   DATA REVIEW:   CBC Recent Labs  Lab 07/07/18 0414  WBC 8.4  HGB 9.0*  HCT 30.0*  PLT 498*    Chemistries  Recent Labs  Lab 07/07/18 0414  NA 137   K 4.0  CL 104  CO2 26  GLUCOSE 91  BUN 23*  CREATININE 1.15*  CALCIUM 8.7*    Cardiac Enzymes No results for input(s): TROPONINI in the last 168 hours.  Microbiology Results  Results for orders placed or performed during the hospital encounter of 06/14/18  SARS Coronavirus 2 [facility](Hospital order, Performed in [facility] lab)     Status: None   Collection Time: 06/14/18  5:32 PM   Specimen: Nasopharyngeal Swab  Result Value Ref Range Status   SARS Coronavirus 2 NEGATIVE NEGATIVE Final    Comment: (NOTE) If result is NEGATIVE SARS-CoV-2 target nucleic acids are NOT DETECTED. The SARS-CoV-2 RNA is generally detectable in upper and lower  respiratory specimens during the acute phase of infection. The lowest  concentration of SARS-CoV-2 viral copies this assay can detect is 250  copies / mL. A negative result does not preclude SARS-CoV-2 infection  and should not be used as the sole basis for treatment or other  patient management decisions.  A negative result may occur with  improper specimen collection / handling, submission of  specimen other  than nasopharyngeal swab, presence of viral mutation(s) within the  areas targeted by this assay, and inadequate number of viral copies  (<250 copies / mL). A negative result must be combined with clinical  observations, patient history, and epidemiological information. If result is POSITIVE SARS-CoV-2 target nucleic acids are DETECTED. The SARS-CoV-2 RNA is generally detectable in upper and lower  respiratory specimens dur ing the acute phase of infection.  Positive  results are indicative of active infection with SARS-CoV-2.  Clinical  correlation with patient history and other diagnostic information is  necessary to determine patient infection status.  Positive results do  not rule out bacterial infection or co-infection with other viruses. If result is PRESUMPTIVE POSTIVE SARS-CoV-2 nucleic acids MAY BE PRESENT.   A  presumptive positive result was obtained on the submitted specimen  and confirmed on repeat testing.  While 2019 novel coronavirus  (SARS-CoV-2) nucleic acids may be present in the submitted sample  additional confirmatory testing may be necessary for epidemiological  and / or clinical management purposes  to differentiate between  SARS-CoV-2 and other Sarbecovirus currently known to infect humans.  If clinically indicated additional testing with an alternate test  methodology [phone]([id number]) is advised. The SARS-CoV-2 RNA is generally  detectable in upper and lower respiratory sp ecimens during the acute  phase of infection. The expected result is Negative. Fact Sheet for Patients:  BoilerBrush.com.cyhttps://www.fda.gov/media/136312/download Fact Sheet for Healthcare Providers: https://pope.com/https://www.fda.gov/media/136313/download This test is not yet approved or cleared by the [place] FDA and has been authorized for detection and/or diagnosis of SARS-CoV-2 by FDA under an Emergency Use Authorization (EUA).  This EUA will remain in effect (meaning this test can be used) for the duration of the COVID-19 declaration under Section 564(b)(1) of the Act, 21 U.S.C. section 360bbb-3(b)(1), unless the authorization is terminated or revoked sooner. Performed at [facility], [address] [place], [place] [address]     RADIOLOGY:  [place] Pelvis Transvanginal Non-ob (tv Only)  Result Date: 07/07/2018 CLINICAL DATA:  Heavy menses. EXAM: TRANSABDOMINAL AND TRANSVAGINAL ULTRASOUND OF PELVIS TECHNIQUE: Both transabdominal and transvaginal ultrasound examinations of the pelvis were performed. Transabdominal technique was performed for global imaging of the pelvis including uterus, ovaries, adnexal regions, and pelvic cul-de-sac. It was necessary to proceed with endovaginal exam following the transabdominal exam to visualize the endometrium and ovaries. COMPARISON:  None FINDINGS: Uterus Measurements: 11.0 x 3.9 x 5.8 cm =  volume: 131 mL. No fibroids or other mass visualized. Few nabothian cyst present. Endometrium Thickness: 8.2 mm.  No focal abnormality visualized. Right ovary Not visualized due to overlying bowel gas. Left ovary Not visualized due to overlying bowel gas. Other findings No significant free pelvic fluid. IMPRESSION: Mild endometrial thickening in this perimenopausal patient with bleeding. Recommend GYN consultation. Electronically Signed   By: Elberta Fortisaniel  Boyle M.D.   On: 07/07/2018 10:10   [place] Pelvis (transabdominal Only)  Result Date: 07/07/2018 CLINICAL DATA:  Heavy menses. EXAM: TRANSABDOMINAL AND TRANSVAGINAL ULTRASOUND OF PELVIS TECHNIQUE: Both transabdominal and transvaginal ultrasound examinations of the pelvis were performed. Transabdominal technique was performed for global imaging of the pelvis including uterus, ovaries, adnexal regions, and pelvic cul-de-sac. It was necessary to proceed with endovaginal exam following the transabdominal exam to visualize the endometrium and ovaries. COMPARISON:  None FINDINGS: Uterus Measurements: 11.0 x 3.9 x 5.8 cm = volume: 131 mL. No fibroids or other mass visualized. Few nabothian cyst present. Endometrium Thickness: 8.2 mm.  No focal abnormality visualized. Right ovary  Not visualized due to overlying bowel gas. Left ovary Not visualized due to overlying bowel gas. Other findings No significant free pelvic fluid. IMPRESSION: Mild endometrial thickening in this perimenopausal patient with bleeding. Recommend GYN consultation. Electronically Signed   By: Marin Olp M.D.   On: 07/07/2018 10:10    EKG:   Orders placed or performed during the hospital encounter of 07/06/18  . ED EKG  . ED EKG      Management plans discussed with the patient, family and they are in agreement.  CODE STATUS:     Code Status Orders  (From admission, onward)         Start     Ordered   07/06/18 1636  Full code  Continuous     07/06/18 1635        Code Status  History    Date Active Date Inactive Code Status Order ID Comments User Context   06/14/2018 2329 06/16/2018 1649 Full Code [id number]  Lance Coon, MD Inpatient   05/17/2016 2302 05/19/2016 2039 Full Code [id number]  Demetrios Loll, MD Inpatient   Advance Care Planning Activity      TOTAL TIME TAKING CARE OF THIS PATIENT: 40 minutes.    Avel Peace Caralina Nop M.D on 07/07/2018 at 10:44 AM  Between 7am to 6pm - Pager - [phone]  After 6pm go to [email or web address] - password [email or web address] [facility]  Office  [phone]  CC: Primary care physician; Gearldine Shown, DO   Note: This dictation was prepared with Dragon dictation along with smaller phrase technology. Any transcriptional errors that result from this process are unintentional.

## 2018-07-07 NOTE — Discharge Instructions (Signed)

## 2018-07-07 NOTE — Progress Notes (Addendum)
Pt noted to have elevated BP post transfusion; pt denies any other sx at this time. Pt does state that this has happened to her before, and states that she did also go to [facility] recently. Currently on 3 BP meds at home.  On call provider notified of findings, no new orders at this time.     Addendum: On call provider ordered OTO Labetalol, will administer at this time.

## 2018-07-08 ENCOUNTER — Telehealth: Payer: Self-pay | Admitting: Obstetrics and Gynecology

## 2018-07-08 LAB — PATHOLOGIST SMEAR REVIEW

## 2018-07-08 NOTE — Telephone Encounter (Signed)
Patient is schedule 07/16/18

## 2018-07-08 NOTE — Telephone Encounter (Signed)
-----   Message from [name], MD sent at 07/07/2018 12:41 PM EDT ----- Will you call and make this patient an appointment with me for a GYN visit.  Thank you,  Christanna

## 2018-07-09 LAB — NOVEL CORONAVIRUS, NAA (HOSP ORDER, SEND-OUT TO REF LAB; TAT 18-24 HRS): SARS-CoV-2, NAA: NOT DETECTED

## 2018-07-10 ENCOUNTER — Other Ambulatory Visit: Payer: Self-pay

## 2018-07-10 ENCOUNTER — Encounter: Payer: Self-pay | Admitting: Oncology

## 2018-07-10 ENCOUNTER — Inpatient Hospital Stay: Payer: BC Managed Care – PPO | Attending: Oncology | Admitting: Oncology

## 2018-07-10 ENCOUNTER — Inpatient Hospital Stay: Payer: BC Managed Care – PPO

## 2018-07-10 VITALS — BP 145/92 | HR 81 | Temp 95.8°F | Resp 18 | Ht 64.0 in | Wt 229.1 lb

## 2018-07-10 DIAGNOSIS — Z809 Family history of malignant neoplasm, unspecified: Secondary | ICD-10-CM | POA: Diagnosis not present

## 2018-07-10 DIAGNOSIS — D500 Iron deficiency anemia secondary to blood loss (chronic): Secondary | ICD-10-CM | POA: Insufficient documentation

## 2018-07-10 DIAGNOSIS — E538 Deficiency of other specified B group vitamins: Secondary | ICD-10-CM | POA: Diagnosis not present

## 2018-07-10 DIAGNOSIS — R0602 Shortness of breath: Secondary | ICD-10-CM

## 2018-07-10 DIAGNOSIS — N189 Chronic kidney disease, unspecified: Secondary | ICD-10-CM

## 2018-07-10 DIAGNOSIS — R5383 Other fatigue: Secondary | ICD-10-CM

## 2018-07-10 DIAGNOSIS — N920 Excessive and frequent menstruation with regular cycle: Secondary | ICD-10-CM | POA: Insufficient documentation

## 2018-07-10 DIAGNOSIS — D509 Iron deficiency anemia, unspecified: Secondary | ICD-10-CM

## 2018-07-10 DIAGNOSIS — Z800 Family history of malignant neoplasm of digestive organs: Secondary | ICD-10-CM | POA: Diagnosis not present

## 2018-07-10 LAB — CBC WITH DIFFERENTIAL/PLATELET
Abs Immature Granulocytes: 0.07 10*3/uL (ref 0.00–0.07)
Basophils Absolute: 0.1 10*3/uL (ref 0.0–0.1)
Basophils Relative: 1 %
Eosinophils Absolute: 0.4 10*3/uL (ref 0.0–0.5)
Eosinophils Relative: 4 %
HCT: 34.2 % — ABNORMAL LOW (ref 36.0–46.0)
Hemoglobin: 9.7 g/dL — ABNORMAL LOW (ref 12.0–15.0)
Immature Granulocytes: 1 %
Lymphocytes Relative: 22 %
Lymphs Abs: 2.2 10*3/uL (ref 0.7–4.0)
MCH: 23.4 pg — ABNORMAL LOW (ref 26.0–34.0)
MCHC: 28.4 g/dL — ABNORMAL LOW (ref 30.0–36.0)
MCV: 82.6 fL (ref 80.0–100.0)
Monocytes Absolute: 0.5 10*3/uL (ref 0.1–1.0)
Monocytes Relative: 5 %
Neutro Abs: 6.8 10*3/uL (ref 1.7–7.7)
Neutrophils Relative %: 67 %
Platelets: 705 10*3/uL — ABNORMAL HIGH (ref 150–400)
RBC: 4.14 MIL/uL (ref 3.87–5.11)
RDW: 28.1 % — ABNORMAL HIGH (ref 11.5–15.5)
Smear Review: INCREASED
WBC: 10 10*3/uL (ref 4.0–10.5)
nRBC: 0.5 % — ABNORMAL HIGH (ref 0.0–0.2)

## 2018-07-10 LAB — IRON AND TIBC
Iron: 75 ug/dL (ref 28–170)
Saturation Ratios: 16 % (ref 10.4–31.8)
TIBC: 469 ug/dL — ABNORMAL HIGH (ref 250–450)
UIBC: 394 ug/dL

## 2018-07-10 LAB — RETIC PANEL
Immature Retic Fract: 39.9 % — ABNORMAL HIGH (ref 2.3–15.9)
RBC.: 4.14 MIL/uL (ref 3.87–5.11)
Retic Count, Absolute: 259.2 10*3/uL — ABNORMAL HIGH (ref 19.0–186.0)
Retic Ct Pct: 6.3 % — ABNORMAL HIGH (ref 0.4–3.1)
Reticulocyte Hemoglobin: 28.3 pg (ref >27.9)

## 2018-07-10 LAB — SAMPLE TO BLOOD BANK

## 2018-07-10 LAB — FERRITIN: Ferritin: 126 ng/mL (ref 11–307)

## 2018-07-10 NOTE — Progress Notes (Signed)
Patient here to establish care for iron deficiency anemia. Referred by Dr. [name].  Patient states that she had heavy bleeding during last menstrual period.

## 2018-07-11 LAB — HEMOGLOBINOPATHY EVALUATION
Hgb A2 Quant: 1.9 % (ref 1.8–3.2)
Hgb A: 98.1 % (ref 96.4–98.8)
Hgb C: 0 %
Hgb F Quant: 0 % (ref 0.0–2.0)
Hgb S Quant: 0 %
Hgb Variant: 0 %

## 2018-07-14 ENCOUNTER — Encounter: Payer: Self-pay | Admitting: Oncology

## 2018-07-14 DIAGNOSIS — D500 Iron deficiency anemia secondary to blood loss (chronic): Secondary | ICD-10-CM

## 2018-07-14 HISTORY — DX: Iron deficiency anemia secondary to blood loss (chronic): D50.0

## 2018-07-14 MED ORDER — VITAMIN B-12 1000 MCG PO TABS: 1000.0000 ug | ORAL_TABLET | Freq: Every day | ORAL | 1 refills | Status: DC

## 2018-07-14 NOTE — Progress Notes (Signed)
Hematology/Oncology Consult note [facility] Telephone:([phone] [phone]   Patient Care Team: Santayana, Titus Mould, DO as PCP - General (Family Medicine)  REFERRING PROVIDER: Efrain Sella, MD  CHIEF COMPLAINTS/REASON FOR VISIT:  Evaluation of anemia  HISTORY OF PRESENTING ILLNESS:  Madison Lewis is a  48 y.o.  female with PMH listed below was seen in consultation at the request of  [name], [name], MD  for evaluation of anemia.   Reviewed patient's recent labs 07/06/2018 Labs revealed anemia with hemoglobin of 6.6, MCV 76.1.  Platelet count 522. Patient was advised by Dr. [name] to go to emergency room.  She had hospitalization from 07/05/2020 07/07/2018. She received 2 units of packed RBC transfusion, received IV iron during admission as well. Pelvic ultrasound showed mild endometrial thickening Anemia work-up consistent with iron deficiency anemia and low B12 level. She also received a B12 injection during admission.  Today patient reports feeling slightly better.  Fatigue has improved after recent blood transfusion and IV Venofer 483m x1. Patient craves for ice.  Associated signs and symptoms: Patient reports fatigue.  SOB with exertion.  Denies weight loss, easy bruising, hematochezia, hemoptysis, hematuria. Context: History of GI bleeding: Denies               History of Chronic kidney disease; CKD               History of autoimmune disease denies               History of hemolytic anemia.  Denies               Last colonoscopy: Colonoscopy and upper endoscopy 05/19/2016 showed nonbleeding internal hemorrhoids and hiatal hernia. Heavy menstrual..  Regular cycle.   Review of Systems  Constitutional: Positive for fatigue. Negative for appetite change, chills and fever.  HENT:   Negative for hearing loss and voice change.   Eyes: Negative for eye problems.  Respiratory: Positive for shortness of breath. Negative for chest  tightness and cough.   Cardiovascular: Negative for chest pain.  Gastrointestinal: Negative for abdominal distention, abdominal pain and blood in stool.  Endocrine: Negative for hot flashes.  Genitourinary: Negative for difficulty urinating and frequency.   Musculoskeletal: Negative for arthralgias.  Skin: Negative for itching and rash.  Neurological: Negative for extremity weakness.  Hematological: Negative for adenopathy.  Psychiatric/Behavioral: Negative for confusion.    MEDICAL HISTORY:  Past Medical History:  Diagnosis Date  . Blood transfusion without reported diagnosis   . Hypertension     SURGICAL HISTORY: Past Surgical History:  Procedure Laterality Date  . CESAREAN SECTION    . COLONOSCOPY WITH PROPOFOL N/A 05/19/2016   Procedure: COLONOSCOPY WITH PROPOFOL;  Surgeon: WLucilla Lame MD;  [facility];  Service: Endoscopy;  Laterality: N/A;  . ESOPHAGOGASTRODUODENOSCOPY (EGD) WITH PROPOFOL N/A 05/19/2016   Procedure: ESOPHAGOGASTRODUODENOSCOPY (EGD) WITH PROPOFOL;  Surgeon: WLucilla Lame MD;  Location: [facility] ENDOSCOPY;  Service: Endoscopy;  Laterality: N/A;  . KNEE SURGERY Bilateral     SOCIAL HISTORY: Social History   Socioeconomic History  . Marital status: Single    Spouse name: Not on file  . Number of children: Not on file  . Years of education: Not on file  . Highest education level: Not on file  Occupational History  . Not on file  Social Needs  . Financial resource strain: Not hard at all  . Food insecurity    Worry: Never true    Inability: Never  true  . Transportation needs    Medical: No    Non-medical: No  Tobacco Use  . Smoking status: Never Smoker  . Smokeless tobacco: Never Used  Substance and Sexual Activity  . Alcohol use: Yes    Alcohol/week: 1.0 standard drinks    Types: 1 Cans of beer per week  . Drug use: No  . Sexual activity: Yes    Birth control/protection: Surgical  Lifestyle  . Physical activity    Days per week:  Patient refused    Minutes per session: Patient refused  . Stress: Only a little  Relationships  . Social Herbalist on phone: Patient refused    Gets together: Patient refused    Attends religious service: Patient refused    Active member of club or organization: Patient refused    Attends meetings of clubs or organizations: Patient refused    Relationship status: Patient refused  . Intimate partner violence    Fear of current or ex partner: No    Emotionally abused: No    Physically abused: No    Forced sexual activity: No  Other Topics Concern  . Not on file  Social History Narrative  . Not on file    FAMILY HISTORY: Family History  Problem Relation Age of Onset  . Hypertension Father   . Pancreatic cancer Father   . CAD Mother   . Diabetes Mother   . Cancer Paternal Grandmother        unknown  . Cancer Paternal Grandfather        unknown  . Breast cancer Neg Hx     ALLERGIES:  has No Known Allergies.  MEDICATIONS:  Current Outpatient Medications  Medication Sig Dispense Refill  . ferrous sulfate 325 (65 FE) MG tablet Take 325 mg by mouth daily with breakfast.    . hydrochlorothiazide (HYDRODIURIL) 25 MG tablet Take 1 tablet (25 mg total) by mouth daily for 30 days. 30 tablet 0  . losartan (COZAAR) 100 MG tablet Take 1 tablet (100 mg total) by mouth daily for 30 days. 30 tablet 0  . metoprolol succinate (TOPROL-XL) 100 MG 24 hr tablet Take 1 tablet (100 mg total) by mouth daily for 30 days. Take with or immediately following a meal. 30 tablet 0  . Multiple Vitamin (MULTIVITAMIN WITH MINERALS) TABS tablet Take 1 tablet by mouth daily.     No current facility-administered medications for this visit.      PHYSICAL EXAMINATION: ECOG PERFORMANCE STATUS: 1 - Symptomatic but completely ambulatory Vitals:   07/10/18 1103  BP: (!) 145/92  Pulse: 81  Resp: 18  Temp: (!) 95.8 F (35.4 C)   Filed Weights   07/10/18 1103  Weight: 229 lb 1.6 oz (103.9 kg)     Physical Exam Constitutional:      General: She is not in acute distress. HENT:     Head: Normocephalic and atraumatic.  Eyes:     General: No scleral icterus.    Pupils: Pupils are equal, round, and reactive to light.  Neck:     Musculoskeletal: Normal range of motion and neck supple.  Cardiovascular:     Rate and Rhythm: Normal rate and regular rhythm.     Heart sounds: Normal heart sounds.  Pulmonary:     Effort: Pulmonary effort is normal. No respiratory distress.     Breath sounds: No wheezing.  Abdominal:     General: Bowel sounds are normal. There is no distension.  Palpations: Abdomen is soft. There is no mass.     Tenderness: There is no abdominal tenderness.  Musculoskeletal: Normal range of motion.        General: No deformity.  Skin:    General: Skin is warm and dry.     Findings: No erythema or rash.  Neurological:     Mental Status: She is alert and oriented to person, place, and time.     Cranial Nerves: No cranial nerve deficit.     Coordination: Coordination normal.  Psychiatric:        Behavior: Behavior normal.        Thought Content: Thought content normal.      LABORATORY DATA:  I have reviewed the data as listed Lab Results  Component Value Date   WBC 10.0 07/10/2018   HGB 9.7 (L) 07/10/2018   HCT 34.2 (L) 07/10/2018   MCV 82.6 07/10/2018   PLT 705 (H) 07/10/2018   Recent Labs    06/14/18 1419 06/15/18 0038 07/06/18 1053 07/07/18 0414  NA 135 137 137 137  K 3.9 3.7 3.6 4.0  CL 106 109 104 104  CO2 _0 GLUCOSE 96 105* 119* 91  BUN 9 13 22* 23*  CREATININE 0.93 1.16* 1.22* 1.15*  CALCIUM 8.3* 8.4* 8.8* 8.7*  GFRNONAA >60 55* 52* 56*  GFRAA >60 >60 >60 >60  PROT 7.3  --   --   --   ALBUMIN 3.8  --   --   --   AST 20  --   --   --   ALT 15  --   --   --   ALKPHOS 68  --   --   --   BILITOT 0.4  --   --   --    Iron/TIBC/Ferritin/ %Sat    Component Value Date/Time   IRON 75 07/10/2018 1146   TIBC 469 (H)  07/10/2018 1146   FERRITIN 126 07/10/2018 1146   IRONPCTSAT 16 07/10/2018 1146     RADIOGRAPHIC STUDIES: I have personally reviewed the radiological images as listed and agreed with the findings in the report. 07/07/2018 US pelvis Mild endometrial thickening in this perimenopausal patient with bleeding. Recommend GYN consultation    ASSESSMENT & PLAN:  1. Iron deficiency anemia due to chronic blood loss   2. Menorrhagia with regular cycle   3. Low serum vitamin B12    #Labs are Reviewed and discussed with patient. Iron has iron deficiency anemia status post 2 units of PRBC transfusion and IV Venofer.  Recommend repeat CBC, iron TIBC ferritin, reticulocyte panel, hemoglobinopathy evaluation  Lab work-up was reviewed. Additional IV Venofer 200 mg weekly x2. Patient may have an appointment of anemia in CKD as well.  Recommend to fully replete iron   Check multiple myeloma panel at next visit.  Plan IV iron with Venofer 26m weekly x 2  doses. Allergy reactions/infusion reaction including anaphylactic reaction discussed with patient. Other side effects include but not limited to high blood pressure, skin rash, weight gain, leg swelling, etc. Patient voices understanding and willing to proceed. Recommend patient to continue follow-up with gastroenterology for small bowel capsule study.  Menorrhagia with regular cycle.  Patient has been referred by GI to establish care with gynecology   Orders Placed This Encounter  Procedures  . CBC with Differential/Platelet    Standing Status:   Future    Number of Occurrences:   1    Standing Expiration Date:  07/10/2019  . Retic Panel    Standing Status:   Future    Number of Occurrences:   1    Standing Expiration Date:   07/10/2019  . Iron and TIBC    Standing Status:   Future    Number of Occurrences:   1    Standing Expiration Date:   07/10/2019  . Ferritin    Standing Status:   Future    Number of Occurrences:   1    Standing  Expiration Date:   07/10/2019  . Hemoglobinopathy evaluation    Standing Status:   Future    Number of Occurrences:   1    Standing Expiration Date:   07/10/2019  . Hold Tube- Blood Bank    Standing Status:   Future    Number of Occurrences:   1    Standing Expiration Date:   07/10/2019    All questions were answered. The patient knows to call the clinic with any problems questions or concerns.  Cc Dr.Toledo. Return of visit: 3 months Dr.Toledo, Thank you for this kind referral and the opportunity to participate in the care of this patient. A copy of today's note is routed to referring provider    [name], MD, PhD  07/14/2018

## 2018-07-16 ENCOUNTER — Ambulatory Visit: Payer: BC Managed Care – PPO | Admitting: Obstetrics and Gynecology

## 2018-07-18 ENCOUNTER — Other Ambulatory Visit: Payer: Self-pay

## 2018-07-22 ENCOUNTER — Inpatient Hospital Stay: Payer: BC Managed Care – PPO | Attending: Oncology

## 2018-07-22 ENCOUNTER — Other Ambulatory Visit: Payer: Self-pay

## 2018-07-22 VITALS — BP 151/86 | HR 73 | Temp 97.1°F | Resp 20

## 2018-07-22 DIAGNOSIS — D500 Iron deficiency anemia secondary to blood loss (chronic): Secondary | ICD-10-CM | POA: Insufficient documentation

## 2018-07-22 DIAGNOSIS — N920 Excessive and frequent menstruation with regular cycle: Secondary | ICD-10-CM | POA: Diagnosis present

## 2018-07-22 MED ORDER — IRON SUCROSE 20 MG/ML IV SOLN: 200.0000 mg | Freq: Once | INTRAVENOUS | Status: DC

## 2018-07-22 MED ORDER — SODIUM CHLORIDE 0.9 % IV SOLN
Freq: Once | INTRAVENOUS | Status: AC
  Administered 2018-07-22: 14:00:00 via INTRAVENOUS
  Filled 2018-07-22: qty 250

## 2018-07-22 NOTE — Progress Notes (Signed)
Unable to obtain patent peripheral IV access today. Patient requesting to reschedule Venofer treatment for another day this week. MD, Dr. Tasia Catchings, notified and aware. Patient returning to clinic Thursday, 07/25/2018, for Venofer treatment.

## 2018-07-25 ENCOUNTER — Inpatient Hospital Stay: Payer: BC Managed Care – PPO

## 2018-07-25 ENCOUNTER — Other Ambulatory Visit: Payer: Self-pay

## 2018-07-25 DIAGNOSIS — D500 Iron deficiency anemia secondary to blood loss (chronic): Secondary | ICD-10-CM | POA: Diagnosis not present

## 2018-07-25 MED ORDER — SODIUM CHLORIDE 0.9 % IV SOLN
Freq: Once | INTRAVENOUS | Status: AC
  Administered 2018-07-25: 14:00:00 via INTRAVENOUS
  Filled 2018-07-25: qty 250

## 2018-07-25 MED ORDER — IRON SUCROSE 20 MG/ML IV SOLN
200.0000 mg | Freq: Once | INTRAVENOUS | Status: AC
  Administered 2018-07-25: 200 mg via INTRAVENOUS
  Filled 2018-07-25: qty 10

## 2018-07-26 ENCOUNTER — Encounter: Payer: Self-pay | Admitting: Obstetrics and Gynecology

## 2018-07-26 ENCOUNTER — Other Ambulatory Visit (HOSPITAL_COMMUNITY)
Admission: RE | Admit: 2018-07-26 | Discharge: 2018-07-26 | Disposition: A | Discharge status: Home / Self Care | Payer: BC Managed Care – PPO | Source: Ambulatory Visit | Attending: Obstetrics and Gynecology | Admitting: Obstetrics and Gynecology

## 2018-07-26 ENCOUNTER — Ambulatory Visit (INDEPENDENT_AMBULATORY_CARE_PROVIDER_SITE_OTHER): Payer: BC Managed Care – PPO | Admitting: Obstetrics and Gynecology

## 2018-07-26 VITALS — BP 140/80 | HR 76 | Ht 64.0 in | Wt 234.0 lb

## 2018-07-26 DIAGNOSIS — N920 Excessive and frequent menstruation with regular cycle: Secondary | ICD-10-CM | POA: Diagnosis present

## 2018-07-26 DIAGNOSIS — N939 Abnormal uterine and vaginal bleeding, unspecified: Secondary | ICD-10-CM | POA: Diagnosis present

## 2018-07-26 DIAGNOSIS — Z124 Encounter for screening for malignant neoplasm of cervix: Secondary | ICD-10-CM | POA: Insufficient documentation

## 2018-07-26 DIAGNOSIS — Z113 Encounter for screening for infections with a predominantly sexual mode of transmission: Secondary | ICD-10-CM

## 2018-07-26 MED ORDER — TRANEXAMIC ACID 650 MG PO TABS: 1300.0000 mg | ORAL_TABLET | Freq: Three times a day (TID) | ORAL | 3 refills | Status: DC

## 2018-07-29 ENCOUNTER — Telehealth: Payer: Self-pay | Admitting: Obstetrics and Gynecology

## 2018-07-29 ENCOUNTER — Encounter: Payer: Self-pay | Admitting: Obstetrics and Gynecology

## 2018-07-29 ENCOUNTER — Inpatient Hospital Stay: Payer: BC Managed Care – PPO

## 2018-07-29 NOTE — Telephone Encounter (Signed)
-----   Message from [name], MD sent at 07/29/2018  5:25 PM EDT ----- Surgery Booking Request Patient Full Name:  Madison Lewis  MRN: [id number]  DOB: [birth year]  Surgeon: [name], MD  Requested Surgery Date and Time: ASAP Primary Diagnosis AND Code: menorrhagia with regular cycle Secondary Diagnosis and Code:  Surgical Procedure: hysteroscopy D&C with endometrial ablation L&D Notification: No Admission Status: same day surgery Length of Surgery: 1 hour Special Case Needs: none H&P: TBD (date) Phone Interview???: yes Interpreter: Language:  Medical Clearance: no Special Scheduling Instructions: Patient is deciding on if she would like an in office procedure or a procedure in the OR, she can decide based on date availability or preference.  Acuity: P3

## 2018-07-29 NOTE — Telephone Encounter (Signed)
Patient is aware of H&P at [facility] on 08/08/18 @ 9:10am, Pre-admit testing phone interview and COVID testing to be scheduled, and OR on 08/13/18. Patient is aware she will be asked to quarantine after COVID testing. Patient is aware she may receive calls from the [place] and [facility]. Patient confirmed [employer]BS, and no secondary insurance. Patient will have FMLA paperwork faxed, likely tomorrow, and is aware of $25 fee.

## 2018-07-29 NOTE — Progress Notes (Signed)
Patient ID: Madison Lewis, female   DOB: 07/16/1970, 48 y.o.   MRN: [id number]  Reason for Consult: Follow-up (ER follow up/ anemia)   Referred by Vivi BarrackSantayana, Gloria Patri*  Subjective:     HPI:  Madison Lewis is a 48 y.o. female. She is following up from the hospital where she was recently hospitalized for a blood transfusion following a heavy period. 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 She is hesitant to have a hysterectomy. She has been counseled on her options for medical and surgical management. She declines medical management such as with and IUD or Depo provera and would like to have an endometrial ablation.   Past Medical History:  Diagnosis Date  . Blood transfusion without reported diagnosis   . Hypertension   . Iron deficiency anemia due to chronic blood loss 07/14/2018   Family History  Problem Relation Age of Onset  . Hypertension Father   . Pancreatic cancer Father   . CAD Mother   . Diabetes Mother   . Cancer Paternal Grandmother        unknown  . Cancer Paternal Grandfather        unknown  . Breast cancer Neg Hx    Past Surgical History:  Procedure Laterality Date  . CESAREAN SECTION    . COLONOSCOPY WITH PROPOFOL N/A 05/19/2016   Procedure: COLONOSCOPY WITH PROPOFOL;  Surgeon: Midge MiniumWohl, Darren, MD;  [facility] END[facility]COPY;  Service: Endoscopy;  Laterality: N/A;  . ESOPHAGOGASTRODUODENOSCOPY (EGD) WITH PROPOFOL N/A 05/19/2016   Procedure: ESOPHAGOGASTRODUODENOSCOPY (EGD) WITH PROPOFOL;  Surgeon: Midge MiniumWohl, Darren, MD;  Location: [facility] ENDOSCOPY;  Service: Endoscopy;  Laterality: N/A;  . KNEE SURGERY Bilateral   . TUBAL LIGATION  1997    Short Social History:  Social History   Tobacco Use  . Smoking status: Never Smoker  . Smokeless tobacco: Never Used  Substance Use Topics  . Alcohol use: Yes    Alcohol/week: 1.0 standard drinks    Types: 1 Cans of beer per week    No Known Allergies   Current Outpatient Medications  Medication Sig Dispense Refill  . ferrous sulfate 325 (65 FE) MG tablet Take 325 mg by mouth daily with breakfast.    . hydrochlorothiazide (HYDRODIURIL) 25 MG tablet Take 1 tablet (25 mg total) by mouth daily for 30 days. 30 tablet 0  . losartan (COZAAR) 100 MG tablet Take 1 tablet (100 mg total) by mouth daily for 30 days. 30 tablet 0  . metoprolol succinate (TOPROL-XL) 100 MG 24 hr tablet Take 1 tablet (100 mg total) by mouth daily for 30 days. Take with or immediately following a meal. 30 tablet 0  . Multiple Vitamin (MULTIVITAMIN WITH MINERALS) TABS tablet Take 1 tablet by mouth daily.    . vitamin B-12 (CYANOCOBALAMIN) 1000 MCG tablet Take 1 tablet (1,000 mcg total) by mouth daily. 90 tablet 1  . tranexamic acid (LYSTEDA) 650 MG TABS tablet Take 2 tablets (1,300 mg total) by mouth 3 (three) times daily. Take during menses for a maximum of five days 30 tablet 3   No current facility-administered medications for this visit.     Review of Systems  Constitutional: Negative for chills, fatigue, fever and unexpected weight change.  HENT: Negative for trouble swallowing.  Eyes: Negative for loss of vision.  Respiratory: Negative for cough, shortness of breath and wheezing.  Cardiovascular: Negative for chest pain, leg swelling, palpitations and syncope.  GI: Negative for abdominal pain,  blood in stool, diarrhea, nausea and vomiting.  GU: Negative for difficulty urinating, dysuria, frequency and hematuria.  Musculoskeletal: Negative for back pain, leg pain and joint pain.  Skin: Negative for rash.  Neurological: Negative for dizziness, headaches, light-headedness, numbness and seizures.  Psychiatric: Negative for behavioral problem, confusion, depressed mood and sleep disturbance.        Objective:  Objective   Vitals:   07/26/18 1155  BP: 140/80  Pulse: 76  Weight: 234 lb (106.1 kg)  Height: 5\' 4"  (1.626 m)   Body mass index is 40.17 kg/m.   Physical Exam Vitals signs and nursing note reviewed.  Constitutional:      Appearance: She is well-developed.  HENT:     Head: Normocephalic and atraumatic.  Eyes:     Pupils: Pupils are equal, round, and reactive to light.  Cardiovascular:     Rate and Rhythm: Normal rate and regular rhythm.  Pulmonary:     Effort: Pulmonary effort is normal. No respiratory distress.  Genitourinary:    Comments: External: Normal appearing vulva. No lesions noted.  Speculum examination: Normal appearing cervix. No blood in the vaginal vault. scant discharge.   Bimanual examination: Uterus midline, non-tender, normal in size, shape and contour.  No CMT. No adnexal masses. No adnexal tenderness. Pelvis not fixed.    Skin:    General: Skin is warm and dry.  Neurological:     Mental Status: She is alert and oriented to person, place, and time.  Psychiatric:        Behavior: Behavior normal.        Thought Content: Thought content normal.        Judgment: Judgment normal.    Endometrial Biopsy After discussion with the patient regarding her abnormal uterine bleeding I recommended that she proceed with an endometrial biopsy for further diagnosis. The risks, benefits, alternatives, and indications for an endometrial biopsy were discussed with the patient in detail. She understood the risks including infection, bleeding, cervical laceration and uterine perforation.  Verbal consent was obtained.   PROCEDURE NOTE:  Pipelle endometrial biopsy was performed using aseptic technique with iodine preparation.  The uterus was sounded to a length of 11 cm.  Adequate sampling was obtained with minimal blood loss.  The patient tolerated the procedure well.  Disposition will be pending pathology.      Assessment/Plan:    48 yo with menorrhagia and regular menstrual cycle.  1. Endometrial biopsy today  2. Will plan follow up for office or OR hysteroscopy procedure with endometrial ablation. 3. Pap smear today  4. Given Lysteda for management of next period.  5. If procedure is delayed will consider Depo Lupron for bleeding management.   More than 40 minutes were spent face to face with the patient in the room with more than 50% of the time spent providing counseling and discussing the plan of management.    Adrian Prows MD [facility], [facility] 07/29/2018 5:18 PM

## 2018-07-30 LAB — CYTOLOGY - PAP
Chlamydia: NEGATIVE
HPV: NOT DETECTED
Neisseria Gonorrhea: NEGATIVE
Trichomonas: NEGATIVE

## 2018-07-31 ENCOUNTER — Other Ambulatory Visit: Payer: Self-pay

## 2018-07-31 NOTE — Progress Notes (Signed)
Released to mychart with note

## 2018-08-01 ENCOUNTER — Other Ambulatory Visit: Payer: Self-pay

## 2018-08-01 ENCOUNTER — Inpatient Hospital Stay: Payer: BC Managed Care – PPO

## 2018-08-01 VITALS — BP 155/89 | HR 78 | Resp 20

## 2018-08-01 DIAGNOSIS — D500 Iron deficiency anemia secondary to blood loss (chronic): Secondary | ICD-10-CM | POA: Diagnosis not present

## 2018-08-01 MED ORDER — IRON SUCROSE 20 MG/ML IV SOLN
200.0000 mg | Freq: Once | INTRAVENOUS | Status: AC
  Administered 2018-08-01: 200 mg via INTRAVENOUS
  Filled 2018-08-01: qty 10

## 2018-08-01 MED ORDER — SODIUM CHLORIDE 0.9 % IV SOLN
Freq: Once | INTRAVENOUS | Status: AC
  Administered 2018-08-01: 14:00:00 via INTRAVENOUS
  Filled 2018-08-01: qty 250

## 2018-08-08 ENCOUNTER — Other Ambulatory Visit: Payer: Self-pay

## 2018-08-08 ENCOUNTER — Encounter: Payer: Self-pay | Admitting: Obstetrics and Gynecology

## 2018-08-08 ENCOUNTER — Encounter
Admission: RE | Admit: 2018-08-08 | Discharge: 2018-08-08 | Disposition: A | Discharge status: Home / Self Care | Payer: BC Managed Care – PPO | Source: Ambulatory Visit | Attending: Obstetrics and Gynecology | Admitting: Obstetrics and Gynecology

## 2018-08-08 ENCOUNTER — Ambulatory Visit (INDEPENDENT_AMBULATORY_CARE_PROVIDER_SITE_OTHER): Payer: BC Managed Care – PPO | Admitting: Obstetrics and Gynecology

## 2018-08-08 VITALS — BP 172/100 | Ht 64.0 in | Wt 231.0 lb

## 2018-08-08 DIAGNOSIS — N920 Excessive and frequent menstruation with regular cycle: Secondary | ICD-10-CM

## 2018-08-08 DIAGNOSIS — Z01818 Encounter for other preprocedural examination: Secondary | ICD-10-CM | POA: Insufficient documentation

## 2018-08-08 DIAGNOSIS — I10 Essential (primary) hypertension: Secondary | ICD-10-CM | POA: Insufficient documentation

## 2018-08-08 DIAGNOSIS — N939 Abnormal uterine and vaginal bleeding, unspecified: Secondary | ICD-10-CM | POA: Diagnosis not present

## 2018-08-08 HISTORY — DX: Headache, unspecified: R51.9

## 2018-08-08 HISTORY — DX: Sleep apnea, unspecified: G47.30

## 2018-08-08 NOTE — H&P (View-Only) (Signed)
Patient ID: Madison Lewis, female   DOB: October 24, 1970, 48 y.o.   MRN: [id number]  Reason for Consult: Pre-op Exam   Referred by Garald Balding*  Subjective:     HPI:  Madison Lewis is a 48 y.o. female . She is here for a preoperative visit. She has been struggling with chronic anemia related to heavy uterine bleeding and menorrhagia. She has been counseled extensively regarding her options for management. She has elected to have a endometrial ablation. She used lysteda for her most recent period which helped reduce her bleeding.   Past Medical History:  Diagnosis Date  . Blood transfusion without reported diagnosis   . Hypertension   . Iron deficiency anemia due to chronic blood loss 07/14/2018   Family History  Problem Relation Age of Onset  . Hypertension Father   . Pancreatic cancer Father   . CAD Mother   . Diabetes Mother   . Cancer Paternal Grandmother        unknown  . Cancer Paternal Grandfather        unknown  . Breast cancer Neg Hx    Past Surgical History:  Procedure Laterality Date  . CESAREAN SECTION    . COLONOSCOPY WITH PROPOFOL N/A 05/19/2016   Procedure: COLONOSCOPY WITH PROPOFOL;  Surgeon: [name], MD;  Location: [facility] ENDOSCOPY;  Service: Endoscopy;  Laterality: N/A;  . ESOPHAGOGASTRODUODENOSCOPY (EGD) WITH PROPOFOL N/A 05/19/2016   Procedure: ESOPHAGOGASTRODUODENOSCOPY (EGD) WITH PROPOFOL;  Surgeon: [name], MD;  Location: [facility] ENDOSCOPY;  Service: Endoscopy;  Laterality: N/A;  . KNEE SURGERY Bilateral   . TUBAL LIGATION  1997    Short Social History:  Social History   Tobacco Use  . Smoking status: Never Smoker  . Smokeless tobacco: Never Used  Substance Use Topics  . Alcohol use: Yes    Alcohol/week: 1.0 standard drinks    Types: 1 Cans of beer per week    No Known Allergies  Current Outpatient Medications  Medication Sig Dispense Refill  . ferrous sulfate 325 (65 FE) MG tablet Take 325 mg by mouth daily with breakfast.     . Multiple Vitamin (MULTIVITAMIN WITH MINERALS) TABS tablet Take 1 tablet by mouth daily.    . tranexamic acid (LYSTEDA) 650 MG TABS tablet Take 2 tablets (1,300 mg total) by mouth 3 (three) times daily. Take during menses for a maximum of five days 30 tablet 3  . vitamin B-12 (CYANOCOBALAMIN) 1000 MCG tablet Take 1 tablet (1,000 mcg total) by mouth daily. 90 tablet 1  . hydrochlorothiazide (HYDRODIURIL) 25 MG tablet Take 1 tablet (25 mg total) by mouth daily for 30 days. 30 tablet 0  . losartan (COZAAR) 100 MG tablet Take 1 tablet (100 mg total) by mouth daily for 30 days. 30 tablet 0  . metoprolol succinate (TOPROL-XL) 100 MG 24 hr tablet Take 1 tablet (100 mg total) by mouth daily for 30 days. Take with or immediately following a meal. 30 tablet 0   No current facility-administered medications for this visit.     Review of Systems  Constitutional: Negative for chills, fatigue, fever and unexpected weight change.  HENT: Negative for trouble swallowing.  Eyes: Negative for loss of vision.  Respiratory: Negative for cough, shortness of breath and wheezing.  Cardiovascular: Negative for chest pain, leg swelling, palpitations and syncope.  GI: Negative for abdominal pain, blood in stool, diarrhea, nausea and vomiting.  GU: Negative for difficulty urinating, dysuria, frequency and hematuria.  Musculoskeletal: Negative for back pain, leg  pain and joint pain.  Skin: Negative for rash.  Neurological: Negative for dizziness, headaches, light-headedness, numbness and seizures.  Psychiatric: Negative for behavioral problem, confusion, depressed mood and sleep disturbance.        Objective:  Objective   Vitals:   08/08/18 0920  BP: (!) 172/100  Weight: 231 lb (104.8 kg)  Height: 5\' 4"  (1.626 m)   Body mass index is 39.65 kg/m.  Physical Exam Vitals signs and nursing note reviewed.  Constitutional:      Appearance: She is well-developed.  HENT:     Head: Normocephalic and  atraumatic.  Eyes:     Pupils: Pupils are equal, round, and reactive to light.  Cardiovascular:     Rate and Rhythm: Normal rate and regular rhythm.  Pulmonary:     Effort: Pulmonary effort is normal. No respiratory distress.  Abdominal:     General: Abdomen is flat.     Palpations: Abdomen is soft. There is no mass.     Tenderness: There is no abdominal tenderness. There is no guarding or rebound.     Hernia: No hernia is present.  Skin:    General: Skin is warm and dry.  Neurological:     Mental Status: She is alert and oriented to person, place, and time.  Psychiatric:        Behavior: Behavior normal.        Thought Content: Thought content normal.        Judgment: Judgment normal.         Assessment/Plan:     48 yo with menorrhagia, will proceed with uterine ablation. Have discussed failure rates of this procedure, risks, benefits and alternatives and patient agrees to proceed. Have discussed preoperative procedures in detail. All questions and concerns addressed.   More than 25 minutes were spent face to face with the patient in the room with more than 50% of the time spent providing counseling and discussing the plan of management.     Adelene Idlerhristanna Patte Winkel MD [facility], [facility] 08/08/2018 9:52 AM

## 2018-08-08 NOTE — Patient Instructions (Signed)
Your procedure is scheduled on: Tues. 7/28 Report to Day Surgery. To find out your arrival time please call [phone] between 1PM - 3PM on Mon 7/27.  Remember: Instructions that are not followed completely may result in serious medical risk,  up to and including death, or upon the discretion of your surgeon and anesthesiologist your  surgery may need to be rescheduled.     _X__ 1. Do not eat food after midnight the night before your procedure.                 No gum chewing or hard candies. You may drink clear liquids up to 2 hours                 before you are scheduled to arrive for your surgery- DO not drink clear                 liquids within 2 hours of the start of your surgery.                 Clear Liquids include:  water, apple juice without pulp, clear carbohydrate                 Drink   Complete  2 hours before you arrive  Gatorade, Black Coffee or Tea (Do not add                 anything to coffee or tea).  __X__2.  On the morning of surgery brush your teeth with toothpaste and water, you                may rinse your mouth with mouthwash if you wish.  Do not swallow any toothpaste of mouthwash.     _X__ 3.  No Alcohol for 24 hours before or after surgery.   ___ 4.  Do Not Smoke or use e-cigarettes For 24 Hours Prior to Your Surgery.                 Do not use any chewable tobacco products for at least 6 hours prior to                 surgery.  ____  5.  Bring all medications with you on the day of surgery if instructed.   __x__  6.  Notify your doctor if there is any change in your medical condition      (cold, fever, infections).     Do not wear jewelry, make-up, hairpins, clips or nail polish. Do not wear lotions, powders, or perfumes. You may wear deodorant. Do not shave 48 hours prior to surgery. Men may shave face and neck. Do not bring valuables to the hospital.    [facility] is not responsible for any belongings or  valuables.  Contacts, dentures or bridgework may not be worn into surgery. Leave your suitcase in the car. After surgery it may be brought to your room. For patients admitted to the hospital, discharge time is determined by your treatment team.   Patients discharged the day of surgery will not be allowed to drive home.   Please read over the following fact sheets that you were given:     _x___ Take these medicines the morning of surgery with A SIP OF WATER:    1. metoprolol succinate (TOPROL-XL) 100 MG 24 hr tablet  2.   3.   4.  5.  6.  ____ Fleet Enema (as directed)  ____ Use CHG Soap as directed  ____ Use inhalers on the day of surgery  ____ Stop metformin 2 days prior to surgery    ____ Take 1/2 of usual insulin dose the night before surgery. No insulin the morning          of surgery.   ____ Stop Coumadin/Plavix/aspirin on   _x___ Stop Anti-inflammatories No Ibuprofen or aleve   May take tylenol    ____ Stop supplements until after surgery.    ____ Bring C-Pap to the hospital.

## 2018-08-08 NOTE — Progress Notes (Signed)
Patient ID: Madison Lewis, female   DOB: October 24, 1970, 48 y.o.   MRN: [id number]  Reason for Consult: Pre-op Exam   Referred by Garald Balding*  Subjective:     HPI:  Madison Lewis is a 48 y.o. female . She is here for a preoperative visit. She has been struggling with chronic anemia related to heavy uterine bleeding and menorrhagia. She has been counseled extensively regarding her options for management. She has elected to have a endometrial ablation. She used lysteda for her most recent period which helped reduce her bleeding.   Past Medical History:  Diagnosis Date  . Blood transfusion without reported diagnosis   . Hypertension   . Iron deficiency anemia due to chronic blood loss 07/14/2018   Family History  Problem Relation Age of Onset  . Hypertension Father   . Pancreatic cancer Father   . CAD Mother   . Diabetes Mother   . Cancer Paternal Grandmother        unknown  . Cancer Paternal Grandfather        unknown  . Breast cancer Neg Hx    Past Surgical History:  Procedure Laterality Date  . CESAREAN SECTION    . COLONOSCOPY WITH PROPOFOL N/A 05/19/2016   Procedure: COLONOSCOPY WITH PROPOFOL;  Surgeon: [name], MD;  Location: [facility] ENDOSCOPY;  Service: Endoscopy;  Laterality: N/A;  . ESOPHAGOGASTRODUODENOSCOPY (EGD) WITH PROPOFOL N/A 05/19/2016   Procedure: ESOPHAGOGASTRODUODENOSCOPY (EGD) WITH PROPOFOL;  Surgeon: [name], MD;  Location: [facility] ENDOSCOPY;  Service: Endoscopy;  Laterality: N/A;  . KNEE SURGERY Bilateral   . TUBAL LIGATION  1997    Short Social History:  Social History   Tobacco Use  . Smoking status: Never Smoker  . Smokeless tobacco: Never Used  Substance Use Topics  . Alcohol use: Yes    Alcohol/week: 1.0 standard drinks    Types: 1 Cans of beer per week    No Known Allergies  Current Outpatient Medications  Medication Sig Dispense Refill  . ferrous sulfate 325 (65 FE) MG tablet Take 325 mg by mouth daily with breakfast.     . Multiple Vitamin (MULTIVITAMIN WITH MINERALS) TABS tablet Take 1 tablet by mouth daily.    . tranexamic acid (LYSTEDA) 650 MG TABS tablet Take 2 tablets (1,300 mg total) by mouth 3 (three) times daily. Take during menses for a maximum of five days 30 tablet 3  . vitamin B-12 (CYANOCOBALAMIN) 1000 MCG tablet Take 1 tablet (1,000 mcg total) by mouth daily. 90 tablet 1  . hydrochlorothiazide (HYDRODIURIL) 25 MG tablet Take 1 tablet (25 mg total) by mouth daily for 30 days. 30 tablet 0  . losartan (COZAAR) 100 MG tablet Take 1 tablet (100 mg total) by mouth daily for 30 days. 30 tablet 0  . metoprolol succinate (TOPROL-XL) 100 MG 24 hr tablet Take 1 tablet (100 mg total) by mouth daily for 30 days. Take with or immediately following a meal. 30 tablet 0   No current facility-administered medications for this visit.     Review of Systems  Constitutional: Negative for chills, fatigue, fever and unexpected weight change.  HENT: Negative for trouble swallowing.  Eyes: Negative for loss of vision.  Respiratory: Negative for cough, shortness of breath and wheezing.  Cardiovascular: Negative for chest pain, leg swelling, palpitations and syncope.  GI: Negative for abdominal pain, blood in stool, diarrhea, nausea and vomiting.  GU: Negative for difficulty urinating, dysuria, frequency and hematuria.  Musculoskeletal: Negative for back pain, leg  pain and joint pain.  Skin: Negative for rash.  Neurological: Negative for dizziness, headaches, light-headedness, numbness and seizures.  Psychiatric: Negative for behavioral problem, confusion, depressed mood and sleep disturbance.        Objective:  Objective   Vitals:   08/08/18 0920  BP: (!) 172/100  Weight: 231 lb (104.8 kg)  Height: 5' 4" (1.626 m)   Body mass index is 39.65 kg/m.  Physical Exam Vitals signs and nursing note reviewed.  Constitutional:      Appearance: She is well-developed.  HENT:     Head: Normocephalic and  atraumatic.  Eyes:     Pupils: Pupils are equal, round, and reactive to light.  Cardiovascular:     Rate and Rhythm: Normal rate and regular rhythm.  Pulmonary:     Effort: Pulmonary effort is normal. No respiratory distress.  Abdominal:     General: Abdomen is flat.     Palpations: Abdomen is soft. There is no mass.     Tenderness: There is no abdominal tenderness. There is no guarding or rebound.     Hernia: No hernia is present.  Skin:    General: Skin is warm and dry.  Neurological:     Mental Status: She is alert and oriented to person, place, and time.  Psychiatric:        Behavior: Behavior normal.        Thought Content: Thought content normal.        Judgment: Judgment normal.         Assessment/Plan:     47 yo with menorrhagia, will proceed with uterine ablation. Have discussed failure rates of this procedure, risks, benefits and alternatives and patient agrees to proceed. Have discussed preoperative procedures in detail. All questions and concerns addressed.   More than 25 minutes were spent face to face with the patient in the room with more than 50% of the time spent providing counseling and discussing the plan of management.     Quaron Delacruz MD [facility], [facility] 08/08/2018 9:52 AM    

## 2018-08-09 ENCOUNTER — Encounter
Admission: RE | Admit: 2018-08-09 | Discharge: 2018-08-09 | Disposition: A | Discharge status: Home / Self Care | Payer: BC Managed Care – PPO | Source: Ambulatory Visit | Attending: Obstetrics and Gynecology | Admitting: Obstetrics and Gynecology

## 2018-08-09 DIAGNOSIS — Z1159 Encounter for screening for other viral diseases: Secondary | ICD-10-CM | POA: Diagnosis not present

## 2018-08-09 DIAGNOSIS — Z01818 Encounter for other preprocedural examination: Secondary | ICD-10-CM | POA: Diagnosis present

## 2018-08-09 DIAGNOSIS — I10 Essential (primary) hypertension: Secondary | ICD-10-CM | POA: Diagnosis not present

## 2018-08-09 LAB — TYPE AND SCREEN
ABO/RH(D): B POS
Antibody Screen: NEGATIVE
Extend sample reason: TRANSFUSED

## 2018-08-09 LAB — PROTIME-INR
INR: 1 (ref 0.8–1.2)
Prothrombin Time: 12.6 seconds (ref 11.4–15.2)

## 2018-08-09 LAB — CBC
HCT: 42.5 % (ref 36.0–46.0)
Hemoglobin: 12.6 g/dL (ref 12.0–15.0)
MCH: 27.5 pg (ref 26.0–34.0)
MCHC: 29.6 g/dL — ABNORMAL LOW (ref 30.0–36.0)
MCV: 92.8 fL (ref 80.0–100.0)
Platelets: 362 10*3/uL (ref 150–400)
RBC: 4.58 MIL/uL (ref 3.87–5.11)
RDW: 24.8 % — ABNORMAL HIGH (ref 11.5–15.5)
WBC: 9.1 10*3/uL (ref 4.0–10.5)
nRBC: 0 % (ref 0.0–0.2)

## 2018-08-09 LAB — POTASSIUM: Potassium: 4.1 mmol/L (ref 3.5–5.1)

## 2018-08-10 LAB — SARS CORONAVIRUS 2 (TAT 6-24 HRS): SARS Coronavirus 2: NEGATIVE

## 2018-08-12 ENCOUNTER — Encounter: Payer: Self-pay | Admitting: Obstetrics and Gynecology

## 2018-08-13 ENCOUNTER — Ambulatory Visit: Payer: BC Managed Care – PPO | Admitting: Anesthesiology

## 2018-08-13 ENCOUNTER — Other Ambulatory Visit: Payer: Self-pay

## 2018-08-13 ENCOUNTER — Encounter: Payer: Self-pay | Admitting: *Deleted

## 2018-08-13 ENCOUNTER — Encounter
Admission: RE | Disposition: A | Discharge status: Home / Self Care | Payer: Self-pay | Source: Home / Self Care | Attending: Obstetrics and Gynecology

## 2018-08-13 ENCOUNTER — Ambulatory Visit
Admission: RE | Admit: 2018-08-13 | Discharge: 2018-08-13 | Disposition: A | Discharge status: Home / Self Care | Payer: BC Managed Care – PPO | Attending: Obstetrics and Gynecology | Admitting: Obstetrics and Gynecology

## 2018-08-13 DIAGNOSIS — D500 Iron deficiency anemia secondary to blood loss (chronic): Secondary | ICD-10-CM | POA: Diagnosis not present

## 2018-08-13 DIAGNOSIS — Z79899 Other long term (current) drug therapy: Secondary | ICD-10-CM | POA: Insufficient documentation

## 2018-08-13 DIAGNOSIS — N920 Excessive and frequent menstruation with regular cycle: Secondary | ICD-10-CM | POA: Insufficient documentation

## 2018-08-13 DIAGNOSIS — N938 Other specified abnormal uterine and vaginal bleeding: Secondary | ICD-10-CM | POA: Diagnosis not present

## 2018-08-13 DIAGNOSIS — I10 Essential (primary) hypertension: Secondary | ICD-10-CM | POA: Insufficient documentation

## 2018-08-13 DIAGNOSIS — G4730 Sleep apnea, unspecified: Secondary | ICD-10-CM | POA: Diagnosis not present

## 2018-08-13 HISTORY — PX: HYSTEROSCOPY W/D&C: SHX1775

## 2018-08-13 LAB — POCT PREGNANCY, URINE: Preg Test, Ur: NEGATIVE

## 2018-08-13 SURGERY — DILATATION AND CURETTAGE /HYSTEROSCOPY
Anesthesia: General

## 2018-08-13 MED ORDER — FAMOTIDINE 20 MG PO TABS
20.0000 mg | ORAL_TABLET | Freq: Once | ORAL | Status: AC
  Administered 2018-08-13: 20 mg via ORAL

## 2018-08-13 MED ORDER — LACTATED RINGERS IV SOLN
INTRAVENOUS | Status: DC
  Administered 2018-08-13: 12:00:00 via INTRAVENOUS

## 2018-08-13 MED ORDER — FENTANYL CITRATE (PF) 100 MCG/2ML IJ SOLN
25.0000 ug | INTRAMUSCULAR | Status: DC | PRN
  Administered 2018-08-13: 50 ug via INTRAVENOUS

## 2018-08-13 MED ORDER — LIDOCAINE HCL (CARDIAC) PF 100 MG/5ML IV SOSY
PREFILLED_SYRINGE | INTRAVENOUS | Status: DC | PRN
  Administered 2018-08-13: 100 mg via INTRAVENOUS

## 2018-08-13 MED ORDER — KETOROLAC TROMETHAMINE 30 MG/ML IJ SOLN
INTRAMUSCULAR | Status: DC | PRN
  Administered 2018-08-13: 30 mg via INTRAVENOUS

## 2018-08-13 MED ORDER — FENTANYL CITRATE (PF) 100 MCG/2ML IJ SOLN
INTRAMUSCULAR | Status: DC | PRN
  Administered 2018-08-13 (×2): 50 ug via INTRAVENOUS

## 2018-08-13 MED ORDER — FAMOTIDINE 20 MG PO TABS
ORAL_TABLET | ORAL | Status: AC
  Administered 2018-08-13: 20 mg via ORAL
  Filled 2018-08-13: qty 1

## 2018-08-13 MED ORDER — TRAMADOL HCL 50 MG PO TABS: 50.0000 mg | ORAL_TABLET | Freq: Four times a day (QID) | ORAL | 0 refills | Status: AC | PRN

## 2018-08-13 MED ORDER — FENTANYL CITRATE (PF) 100 MCG/2ML IJ SOLN
INTRAMUSCULAR | Status: AC
  Filled 2018-08-13: qty 2

## 2018-08-13 MED ORDER — DEXAMETHASONE SODIUM PHOSPHATE 10 MG/ML IJ SOLN
INTRAMUSCULAR | Status: DC | PRN
  Administered 2018-08-13: 10 mg via INTRAVENOUS

## 2018-08-13 MED ORDER — IBUPROFEN 800 MG PO TABS: 800.0000 mg | ORAL_TABLET | Freq: Three times a day (TID) | ORAL | 1 refills | Status: DC | PRN

## 2018-08-13 MED ORDER — PROPOFOL 10 MG/ML IV BOLUS
INTRAVENOUS | Status: AC
  Filled 2018-08-13: qty 20

## 2018-08-13 MED ORDER — OXYCODONE HCL 5 MG/5ML PO SOLN: 5.0000 mg | Freq: Once | ORAL | Status: DC | PRN

## 2018-08-13 MED ORDER — KETOROLAC TROMETHAMINE 30 MG/ML IJ SOLN
INTRAMUSCULAR | Status: AC
  Filled 2018-08-13: qty 1

## 2018-08-13 MED ORDER — MIDAZOLAM HCL 2 MG/2ML IJ SOLN
INTRAMUSCULAR | Status: AC
  Filled 2018-08-13: qty 2

## 2018-08-13 MED ORDER — MIDAZOLAM HCL 2 MG/2ML IJ SOLN
INTRAMUSCULAR | Status: DC | PRN
  Administered 2018-08-13: 2 mg via INTRAVENOUS

## 2018-08-13 MED ORDER — PROPOFOL 10 MG/ML IV BOLUS
INTRAVENOUS | Status: DC | PRN
  Administered 2018-08-13: 200 mg via INTRAVENOUS

## 2018-08-13 MED ORDER — OXYCODONE HCL 5 MG PO TABS: 5.0000 mg | ORAL_TABLET | Freq: Once | ORAL | Status: DC | PRN

## 2018-08-13 MED ORDER — ONDANSETRON HCL 4 MG/2ML IJ SOLN
INTRAMUSCULAR | Status: DC | PRN
  Administered 2018-08-13: 4 mg via INTRAVENOUS

## 2018-08-13 SURGICAL SUPPLY — 20 items
ABLATOR SURESOUND NOVASURE (ABLATOR) ×1 IMPLANT
CATH ROBINSON RED A/P 16FR (CATHETERS) ×2 IMPLANT
DEVICE MYOSURE LITE (MISCELLANEOUS) IMPLANT
DEVICE MYOSURE REACH (MISCELLANEOUS) IMPLANT
ELECT REM PT RETURN 9FT ADLT (ELECTROSURGICAL)
ELECTRODE REM PT RTRN 9FT ADLT (ELECTROSURGICAL) IMPLANT
GLOVE BIOGEL PI IND STRL 6.5 (GLOVE) ×2 IMPLANT
GLOVE BIOGEL PI INDICATOR 6.5 (GLOVE) ×2
GLOVE SURG SYN 6.5 ES PF (GLOVE) ×2 IMPLANT
GLOVE SURG SYN 6.5 PF PI (GLOVE) ×1 IMPLANT
GOWN STRL REUS W/ TWL LRG LVL3 (GOWN DISPOSABLE) ×2 IMPLANT
GOWN STRL REUS W/TWL LRG LVL3 (GOWN DISPOSABLE) ×2
KIT PROCEDURE FLUENT (KITS) IMPLANT
PACK DNC HYST (MISCELLANEOUS) ×2 IMPLANT
PAD OB MATERNITY 4.3X12.25 (PERSONAL CARE ITEMS) ×2 IMPLANT
PAD PREP 24X41 OB/GYN DISP (PERSONAL CARE ITEMS) ×2 IMPLANT
SEAL ROD LENS SCOPE MYOSURE (ABLATOR) ×2 IMPLANT
SOL .9 NS 3000ML IRR  AL (IV SOLUTION) ×1
SOL .9 NS 3000ML IRR UROMATIC (IV SOLUTION) ×1 IMPLANT
TOWEL OR 17X26 4PK STRL BLUE (TOWEL DISPOSABLE) ×2 IMPLANT

## 2018-08-13 NOTE — Discharge Instructions (Signed)
Endometrial Ablation, Care After °This sheet gives you information about how to care for yourself after your procedure. Your health care provider may also give you more specific instructions. If you have problems or questions, contact your health care provider. °What can I expect after the procedure? °After the procedure, it is common to have: °· A need to urinate more frequently than usual for the first 24 hours. °· Cramps similar to menstrual cramps. These may last for 1-2 days. °· Thin, watery vaginal discharge that is light pink or brown in color. This may last a few weeks. Discharge will be heavy for the first few days after your procedure. You may need to wear a sanitary pad. °· Nausea. °· Vaginal bleeding for 4-6 weeks after the procedure, as tissue healing occurs. °Follow these instructions at home: °Activity °· Do not drive for 24 hours if you were given amedicine to help you relax (sedative) during your procedure. °· Do not have sex or put anything into your vagina until your health care provider approves. °· Do not lift anything that is heavier than 10 lb (4.5 kg), or the limit that you are told, until your health care provider says that it is safe. °· Return to your normal activities as told by your health care provider. Ask your health care provider what activities are safe for you. °General instructions ° °· Take over-the-counter and prescription medicines only as told by your health care provider. °· Do not take baths, swim, or use a hot tub until your health care provider approves. You will be able to take showers. °· Check your vaginal area every day for signs of infection. Check for: °? Redness, swelling, or pain. °? More discharge or blood, instead of less. °? Bad-smelling discharge. °· Keep all follow-up visits as told by your health care provider. This is important. °· Drink enough fluid to keep your urine pale yellow. °Contact a health care provider if you have: °· Vaginal redness, swelling, or  pain. °· Vaginal discharge or bleeding that gets worse instead of getting better. °· Bad-smelling vaginal discharge. °· A fever or chills. °· Trouble urinating. °Get help right away if you have: °· Heavy vaginal bleeding. °· Severe cramps. °Summary °· After endometrial ablation, it is normal to have thin, watery vaginal discharge that is light pink or brown in color. This may last a few weeks and may be heavier right after the procedure. °· Vaginal bleeding is also normal after the procedure and should get better with time. °· Check your vaginal area every day for signs of infection, such as bad-smelling discharge. °· Keep all follow-up visits as told by your health care provider. This is important. °This information is not intended to replace advice given to you by your health care provider. Make sure you discuss any questions you have with your health care provider. °Document Released: 11/14/2016 Document Revised: 04/25/2018 Document Reviewed: 11/14/2016 °Elsevier Patient Education © 2020 [employer] ° ° °AMBULATORY SURGERY  °DISCHARGE INSTRUCTIONS ° ° °1) The drugs that you were given will stay in your system until tomorrow so for the next 24 hours you should not: ° °A) Drive an automobile °B) Make any legal decisions °C) Drink any alcoholic beverage ° ° °2) You may resume regular meals tomorrow.  Today it is better to start with liquids and gradually work up to solid foods. ° °You may eat anything you prefer, but it is better to start with liquids, then soup and crackers, and gradually work   up to solid foods. ° ° °3) Please notify your doctor immediately if you have any unusual bleeding, trouble breathing, redness and pain at the surgery site, drainage, fever, or pain not relieved by medication. ° ° ° °4) Additional Instructions: ° ° ° ° ° ° ° °Please contact your physician with any problems or Same Day Surgery at [phone], Monday through Friday 6 am to 4 pm, or [place] [facility] number at  [phone]. ° °

## 2018-08-13 NOTE — Op Note (Signed)
Operative Note  08/13/2018  PRE-OP DIAGNOSIS: Menorrhagia with regular periods, abnormal uterine bleeding  POST-OP DIAGNOSIS: same   SURGEON: Satoya Feeley MD  PROCEDURE: Procedure(s): DILATATION AND CURETTAGE /HYSTEROSCOPY WITH ENDOMETRIAL ABLATION   ANESTHESIA: Choice   ESTIMATED BLOOD LOSS: less than 10 cc   SPECIMENS:  Endometrial curettings  FLUID DEFICIT: minimal  COMPLICATIONS: None  DISPOSITION: PACU - hemodynamically stable.  CONDITION: stable  FINDINGS: Exam under anesthesia revealed  Mobile 10cm uterus with bilateral adnexa without masses or fullness. Hysteroscopy revealed normal uterine cavity with bilateral tubal ostia and normal appearing endocervical canal.  PROCEDURE IN DETAIL: After informed consent was obtained, the patient was taken to the operating room where anesthesia was obtained without difficulty. The patient was positioned in the dorsal lithotomy position in [place]. The patient's bladder was catheterized with an in and out foley catheter. The patient was examined under anesthesia, with the above noted findings. The weightedspeculum was placed inside the patient's vagina, and the the anterior lip of the cervix was seen and grasped with the tenaculum.  The uterine cavity was sounded to 10 cm, and then the cervix was progressively dilated to a 16 French-Pratt dilator. The 0 degree hysteroscope was introduced, with saline fluid used to distend the intrauterine cavity, with the above noted findings.  The hysteroscope was removed.  The uterine cavity was curetted until a gritty texture was noted, yielding endometrial curettings.   The Novasure endometrial ablation device was then placed without difficulty. Measurements were obtained. Patient was noted to have a uterine length of 6.5 cm, a cervical length of 3.5 cm, and a uterine width of 3.7 cm. The device is first tested and after confirmation the procedures performed. Length of procedure was 1 minute  and 19 seconds. The power was 132. The ablation device was removed and repeat hysteroscopy revealed an appropriate lining of the uterus and no perforation or injury.   All instruments were removed, with excellent hemostasis noted throughout. She was then taken out of dorsal lithotomy.  Minimal discrepancy in fluid was noted.  The patient tolerated the procedure well. Sponge, lap and needle counts were correct x2. The patient was taken to recovery room in excellent condition.  Adrian Prows MD [facility], [facility] 08/13/2018 1:07 PM

## 2018-08-13 NOTE — Transfer of Care (Signed)
Immediate Anesthesia Transfer of Care Note  Patient: Madison Lewis  Procedure(s) Performed: DILATATION AND CURETTAGE /HYSTEROSCOPY WITH ENDOMETRIAL ABLATION (N/A )  Patient Location: PACU  Anesthesia Type:General  Level of Consciousness: drowsy and patient cooperative  Airway & Oxygen Therapy: Patient Spontanous Breathing and Patient connected to face mask oxygen  Post-op Assessment: Report given to RN and Post -op Vital signs reviewed and stable  Post vital signs: Reviewed and stable  Last Vitals:  Vitals Value Taken Time  BP 144/97 08/13/18 1302  Temp 36.2 C 08/13/18 1302  Pulse 75 08/13/18 1305  Resp 12 08/13/18 1305  SpO2 99 % 08/13/18 1305  Vitals shown include unvalidated device data.  Last Pain:  Vitals:   08/13/18 1302  TempSrc:   PainSc: 0-No pain         Complications: No apparent anesthesia complications

## 2018-08-13 NOTE — Anesthesia Post-op Follow-up Note (Signed)
Anesthesia QCDR form completed.        

## 2018-08-13 NOTE — Anesthesia Procedure Notes (Signed)
Procedure Name: LMA Insertion Date/Time: 08/13/2018 12:06 PM Performed by: Jonna Clark, CRNA Pre-anesthesia Checklist: Patient identified, Patient being monitored, Timeout performed, Emergency Drugs available and Suction available Patient Re-evaluated:Patient Re-evaluated prior to induction Oxygen Delivery Method: Circle system utilized Preoxygenation: Pre-oxygenation with 100% oxygen Induction Type: IV induction Ventilation: Mask ventilation without difficulty LMA: LMA inserted LMA Size: 4.0 Tube type: Oral Number of attempts: 1 Placement Confirmation: positive ETCO2 and breath sounds checked- equal and bilateral Tube secured with: Tape Dental Injury: Teeth and Oropharynx as per pre-operative assessment

## 2018-08-13 NOTE — Addendum Note (Signed)
Addendum  created 08/13/18 1546 by Doreen Salvage, CRNA   Charge Capture section accepted
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
## 2018-08-13 NOTE — Anesthesia Postprocedure Evaluation (Signed)
Anesthesia Post Note  Patient: Madison Lewis  Procedure(s) Performed: DILATATION AND CURETTAGE /HYSTEROSCOPY WITH ENDOMETRIAL ABLATION (N/A )  Patient location during evaluation: PACU Anesthesia Type: General Level of consciousness: awake and alert Pain management: pain level controlled Vital Signs Assessment: post-procedure vital signs reviewed and stable Respiratory status: spontaneous breathing, nonlabored ventilation, respiratory function stable and patient connected to nasal cannula oxygen Cardiovascular status: blood pressure returned to baseline and stable Postop Assessment: no apparent nausea or vomiting Anesthetic complications: no     Last Vitals:  Vitals:   08/13/18 1343 08/13/18 1405  BP: (!) 151/92 (!) 149/76  Pulse: 60 65  Resp: 16 16  Temp: (!) 36.2 C 36.6 C  SpO2: 96% 98%    Last Pain:  Vitals:   08/13/18 1405  TempSrc: Oral  PainSc: 3                  Precious Haws Piscitello

## 2018-08-13 NOTE — Interval H&P Note (Signed)
History and Physical Interval Note:  08/13/2018 11:19 AM  Madison Lewis  has presented today for surgery, with the diagnosis of MENORRHAGIA WITH REGULAR CYCLE.  The various methods of treatment have been discussed with the patient and family. After consideration of risks, benefits and other options for treatment, the patient has consented to  Procedure(s): DILATATION AND CURETTAGE /HYSTEROSCOPY WITH ENDOMETRIAL ABLATION (N/A) as a surgical intervention.  The patient's history has been reviewed, patient examined, no change in status, stable for surgery.  I have reviewed the patient's chart and labs.  Questions were answered to the patient's satisfaction.     [facility]

## 2018-08-14 ENCOUNTER — Other Ambulatory Visit: Payer: Self-pay | Admitting: Obstetrics and Gynecology

## 2018-08-14 DIAGNOSIS — Z124 Encounter for screening for malignant neoplasm of cervix: Secondary | ICD-10-CM

## 2018-08-15 LAB — SURGICAL PATHOLOGY

## 2018-08-15 NOTE — Progress Notes (Signed)
WNL- Called and discussed with patient

## 2018-08-21 ENCOUNTER — Ambulatory Visit: Payer: BC Managed Care – PPO | Admitting: Obstetrics and Gynecology

## 2018-08-22 ENCOUNTER — Other Ambulatory Visit: Payer: Self-pay

## 2018-08-22 ENCOUNTER — Ambulatory Visit (INDEPENDENT_AMBULATORY_CARE_PROVIDER_SITE_OTHER): Payer: BC Managed Care – PPO | Admitting: Obstetrics and Gynecology

## 2018-08-22 ENCOUNTER — Encounter: Payer: Self-pay | Admitting: Obstetrics and Gynecology

## 2018-08-22 ENCOUNTER — Other Ambulatory Visit (HOSPITAL_COMMUNITY)
Admission: RE | Admit: 2018-08-22 | Discharge: 2018-08-22 | Disposition: A | Discharge status: Home / Self Care | Payer: BC Managed Care – PPO | Source: Ambulatory Visit | Attending: Obstetrics and Gynecology | Admitting: Obstetrics and Gynecology

## 2018-08-22 VITALS — BP 164/100 | Ht 64.0 in | Wt 231.0 lb

## 2018-08-22 DIAGNOSIS — Z124 Encounter for screening for malignant neoplasm of cervix: Secondary | ICD-10-CM

## 2018-08-22 NOTE — Progress Notes (Signed)
  Postoperative Follow-up Patient presents post op from operative hysteroscopy and novasure for abnormal uterine bleeding, 1 week ago.  Subjective: Patient reports marked improvement in her preop symptoms. Eating a regular diet without difficulty. Pain is controlled without any medications.  Activity: normal activities of daily living. Patient reports additional symptom's since surgery of None.  Objective: BP (!) 164/100   Ht 5\' 4"  (1.626 m)   Wt 231 lb (104.8 kg)   LMP 08/01/2018   BMI 39.65 kg/m  Physical Exam Constitutional:      Appearance: She is well-developed.  Genitourinary:     Vulva, vagina and uterus normal.     No lesions in the vagina.     No cervical motion tenderness.     No right or left adnexal mass present.     Genitourinary Comments: External: Normal appearing vulva. No lesions noted.  Speculum examination: Normal appearing cervix. No blood in the vaginal vault. no discharge.  HENT:     Head: Normocephalic and atraumatic.  Neck:     Musculoskeletal: Neck supple.     Thyroid: No thyromegaly.  Cardiovascular:     Rate and Rhythm: Normal rate and regular rhythm.     Heart sounds: Normal heart sounds.  Pulmonary:     Effort: Pulmonary effort is normal.     Breath sounds: Normal breath sounds.  Chest:     Breasts:        Right: No inverted nipple, mass, nipple discharge or skin change.        Left: No inverted nipple, mass, nipple discharge or skin change.  Abdominal:     General: Bowel sounds are normal. There is no distension.     Palpations: Abdomen is soft. There is no mass.  Neurological:     Mental Status: She is alert and oriented to person, place, and time.  Skin:    General: Skin is warm and dry.  Psychiatric:        Behavior: Behavior normal.        Thought Content: Thought content normal.        Judgment: Judgment normal.  Vitals signs reviewed.     Assessment: s/p :  operative hysteroscopy with novasure stable  Plan: Patient has done  well after surgery with no apparent complications.  I have discussed the post-operative course to date, and the expected progress moving forward.  The patient understands what complications to be concerned about.  I will see the patient in routine follow up, or sooner if needed.    Will take Lysteda if needed for heavy bleeding with her next period. 3 refills available. Discussed expectation for menstrual cycle  Pap smear repeated today.   Activity plan: No restriction.  Pelvic rest.  Arhan Mcmanamon R Cearra Portnoy 08/22/2018, 1:53 PM

## 2018-08-26 LAB — CYTOLOGY - PAP
Diagnosis: NEGATIVE
HPV: NOT DETECTED

## 2018-08-27 NOTE — Progress Notes (Signed)
NIL HPV negative, released to [employer]

## 2018-08-28 ENCOUNTER — Telehealth: Payer: Self-pay | Admitting: Obstetrics and Gynecology

## 2018-08-28 NOTE — Telephone Encounter (Signed)
Pt aware we have the paperwork and it is going thru it's processes.

## 2018-08-28 NOTE — Telephone Encounter (Signed)
Patient is calling about the status of her FMLA paper work. Patient is aware Madison Lewis is out of the office this week. Could you find out the status of her paperwork and if we have received it? Thank you!

## 2018-10-11 ENCOUNTER — Other Ambulatory Visit: Payer: Self-pay

## 2018-10-11 ENCOUNTER — Inpatient Hospital Stay: Payer: BC Managed Care – PPO | Attending: Oncology

## 2018-10-11 DIAGNOSIS — N189 Chronic kidney disease, unspecified: Secondary | ICD-10-CM | POA: Insufficient documentation

## 2018-10-11 DIAGNOSIS — E538 Deficiency of other specified B group vitamins: Secondary | ICD-10-CM | POA: Diagnosis not present

## 2018-10-11 DIAGNOSIS — D500 Iron deficiency anemia secondary to blood loss (chronic): Secondary | ICD-10-CM | POA: Insufficient documentation

## 2018-10-11 DIAGNOSIS — N920 Excessive and frequent menstruation with regular cycle: Secondary | ICD-10-CM | POA: Diagnosis not present

## 2018-10-11 LAB — CBC WITH DIFFERENTIAL/PLATELET
Abs Immature Granulocytes: 0.03 10*3/uL (ref 0.00–0.07)
Basophils Absolute: 0.1 10*3/uL (ref 0.0–0.1)
Basophils Relative: 1 %
Eosinophils Absolute: 0.3 10*3/uL (ref 0.0–0.5)
Eosinophils Relative: 3 %
HCT: 44.6 % (ref 36.0–46.0)
Hemoglobin: 14.3 g/dL (ref 12.0–15.0)
Immature Granulocytes: 0 %
Lymphocytes Relative: 27 %
Lymphs Abs: 2.4 10*3/uL (ref 0.7–4.0)
MCH: 28.9 pg (ref 26.0–34.0)
MCHC: 32.1 g/dL (ref 30.0–36.0)
MCV: 90.3 fL (ref 80.0–100.0)
Monocytes Absolute: 0.6 10*3/uL (ref 0.1–1.0)
Monocytes Relative: 7 %
Neutro Abs: 5.6 10*3/uL (ref 1.7–7.7)
Neutrophils Relative %: 62 %
Platelets: 292 10*3/uL (ref 150–400)
RBC: 4.94 MIL/uL (ref 3.87–5.11)
RDW: 13.9 % (ref 11.5–15.5)
WBC: 8.9 10*3/uL (ref 4.0–10.5)
nRBC: 0 % (ref 0.0–0.2)

## 2018-10-11 LAB — IRON AND TIBC
Iron: 263 ug/dL — ABNORMAL HIGH (ref 28–170)
Saturation Ratios: 66 % — ABNORMAL HIGH (ref 10.4–31.8)
TIBC: 396 ug/dL (ref 250–450)
UIBC: 134 ug/dL

## 2018-10-11 LAB — RETIC PANEL
Immature Retic Fract: 11.9 % (ref 2.3–15.9)
RBC.: 4.94 MIL/uL (ref 3.87–5.11)
Retic Count, Absolute: 78.1 10*3/uL (ref 19.0–186.0)
Retic Ct Pct: 1.6 % (ref 0.4–3.1)
Reticulocyte Hemoglobin: 32.8 pg (ref >27.9)

## 2018-10-11 LAB — FERRITIN: Ferritin: 15 ng/mL (ref 11–307)

## 2018-10-14 ENCOUNTER — Inpatient Hospital Stay: Payer: BC Managed Care – PPO

## 2018-10-14 ENCOUNTER — Encounter: Payer: Self-pay | Admitting: Oncology

## 2018-10-14 ENCOUNTER — Inpatient Hospital Stay (HOSPITAL_BASED_OUTPATIENT_CLINIC_OR_DEPARTMENT_OTHER): Payer: BC Managed Care – PPO | Admitting: Oncology

## 2018-10-14 ENCOUNTER — Other Ambulatory Visit: Payer: Self-pay

## 2018-10-14 VITALS — BP 177/111 | HR 79 | Temp 97.8°F | Resp 18 | Wt 230.6 lb

## 2018-10-14 DIAGNOSIS — E538 Deficiency of other specified B group vitamins: Secondary | ICD-10-CM

## 2018-10-14 DIAGNOSIS — D500 Iron deficiency anemia secondary to blood loss (chronic): Secondary | ICD-10-CM | POA: Diagnosis not present

## 2018-10-14 LAB — MULTIPLE MYELOMA PANEL, SERUM
Albumin SerPl Elph-Mcnc: 3.7 g/dL (ref 2.9–4.4)
Albumin/Glob SerPl: 1.2 (ref 0.7–1.7)
Alpha 1: 0.2 g/dL (ref 0.0–0.4)
Alpha2 Glob SerPl Elph-Mcnc: 0.6 g/dL (ref 0.4–1.0)
B-Globulin SerPl Elph-Mcnc: 1 g/dL (ref 0.7–1.3)
Gamma Glob SerPl Elph-Mcnc: 1.2 g/dL (ref 0.4–1.8)
Globulin, Total: 3.1 g/dL (ref 2.2–3.9)
IgA: 217 mg/dL (ref 87–352)
IgG (Immunoglobin G), Serum: 1331 mg/dL (ref 586–1602)
IgM (Immunoglobulin M), Srm: 110 mg/dL (ref 26–217)
Total Protein ELP: 6.8 g/dL (ref 6.0–8.5)

## 2018-10-14 LAB — KAPPA/LAMBDA LIGHT CHAINS
Kappa free light chain: 28.7 mg/L — ABNORMAL HIGH (ref 3.3–19.4)
Kappa, lambda light chain ratio: 1.23 (ref 0.26–1.65)
Lambda free light chains: 23.4 mg/L (ref 5.7–26.3)
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
## 2018-10-15 MED ORDER — VITAMIN B-12 1000 MCG PO TABS: 1000.0000 ug | ORAL_TABLET | Freq: Every day | ORAL | 3 refills | Status: AC

## 2018-10-15 NOTE — Progress Notes (Signed)
Hematology/Oncology follow up  note Madison Lewis [phone] [phone]   Patient Care Team: Santayana, Saintclair Halsted, DO as PCP - General (Family Medicine)  REFERRING PROVIDER: Vivi Barrack*  CHIEF COMPLAINTS/REASON FOR VISIT:  Follow up for  anemia  HISTORY OF PRESENTING ILLNESS:  Madison Lewis is a  48 y.o.  female with PMH listed below was seen in consultation at the request of  Madison Lewis, Madison Nearing, MD  for evaluation of anemia.   Reviewed patient's recent labs 07/06/2018 Labs revealed anemia with hemoglobin of 6.6, MCV 76.1.  Platelet count 522. Patient was advised by Dr. Norma Lewis to go to emergency room.  She had hospitalization from 07/05/2020 07/07/2018. She received 2 units of packed RBC transfusion, received IV iron during admission as well. Pelvic ultrasound showed mild endometrial thickening Anemia work-up consistent with iron deficiency anemia and low B12 level. She also received a B12 injection during admission.  Today patient reports feeling slightly better.  Fatigue has improved after recent blood transfusion and IV Venofer 400mg  x1. Patient craves for ice.  Associated signs and symptoms: Patient reports fatigue.  SOB with exertion.  Denies weight loss, easy bruising, hematochezia, hemoptysis, hematuria. Context: History of GI bleeding: Denies               History of Chronic kidney disease; CKD               History of autoimmune disease denies               History of hemolytic anemia.  Denies               Last colonoscopy: Colonoscopy and upper endoscopy 05/19/2016 showed nonbleeding internal hemorrhoids and hiatal hernia. Heavy menstrual..  Regular cycle.  INTERVAL HISTORY [name] is a 48 y.o. female who has above history reviewed by me today presents for follow up visit for management of anemia Problems and complaints are listed below: Anemia was thought to be multifactorial, due to chronic kidney  disease and iron deficiency.  Patient received IV Venofer weekly x2 during the interval.  Patient is also taking oral iron supplementation. Today she reports feeling well.  Energy level has improved. Patient was seen by GYN for heavy menstrual bleeding.  Status post D&C and endometrial ablation.    Review of Systems  Constitutional: Negative for appetite change, chills, fatigue and fever.  HENT:   Negative for hearing loss and voice change.   Eyes: Negative for eye problems.  Respiratory: Negative for chest tightness, cough and shortness of breath.   Cardiovascular: Negative for chest pain.  Gastrointestinal: Negative for abdominal distention, abdominal pain and blood in stool.  Endocrine: Negative for hot flashes.  Genitourinary: Negative for difficulty urinating and frequency.   Musculoskeletal: Negative for arthralgias.  Skin: Negative for itching and rash.  Neurological: Negative for extremity weakness.  Hematological: Negative for adenopathy.  Psychiatric/Behavioral: Negative for confusion.    MEDICAL HISTORY:  Past Medical History:  Diagnosis Date  . Blood transfusion without reported diagnosis   . Headache   . Hypertension   . Iron deficiency anemia due to chronic blood loss 07/14/2018  . Sleep apnea    CPAP ordered but can't tolerate it    SURGICAL HISTORY: Past Surgical History:  Procedure Laterality Date  . CESAREAN SECTION    . COLONOSCOPY WITH PROPOFOL N/A 05/19/2016   Procedure: COLONOSCOPY WITH PROPOFOL;  Surgeon: 07/19/2016, MD;  Location: [facility] ENDOSCOPY;  Service: Endoscopy;  Laterality: N/A;  .  ESOPHAGOGASTRODUODENOSCOPY (EGD) WITH PROPOFOL N/A 05/19/2016   Procedure: ESOPHAGOGASTRODUODENOSCOPY (EGD) WITH PROPOFOL;  Surgeon: Madison Minium, MD;  Location: [facility] ENDOSCOPY;  Service: Endoscopy;  Laterality: N/A;  . HYSTEROSCOPY W/D&C N/A 08/13/2018   Procedure: DILATATION AND CURETTAGE /HYSTEROSCOPY WITH ENDOMETRIAL ABLATION;  Surgeon: Madison Milch, MD;   Location: [facility] ORS;  Service: Gynecology;  Laterality: N/A;  . KNEE SURGERY Bilateral   . TUBAL LIGATION  1997    SOCIAL HISTORY: Social History   Socioeconomic History  . Marital status: Single    Spouse name: Not on file  . Number of children: Not on file  . Years of education: Not on file  . Highest education level: Not on file  Occupational History  . Not on file  Social Needs  . Financial resource strain: Not hard at all  . Food insecurity    Worry: Never true    Inability: Never true  . Transportation needs    Medical: No    Non-medical: No  Tobacco Use  . Smoking status: Never Smoker  . Smokeless tobacco: Never Used  Substance and Sexual Activity  . Alcohol use: Yes    Alcohol/week: 1.0 standard drinks    Types: 1 Cans of beer per week  . Drug use: No  . Sexual activity: Not Currently    Birth control/protection: Surgical    Comment: Tubal ligation   Lifestyle  . Physical activity    Days per week: Patient refused    Minutes per session: Patient refused  . Stress: Only a little  Relationships  . Social Musician on phone: Patient refused    Gets together: Patient refused    Attends religious service: Patient refused    Active member of club or organization: Patient refused    Attends meetings of clubs or organizations: Patient refused    Relationship status: Patient refused  . Intimate partner violence    Fear of current or ex partner: No    Emotionally abused: No    Physically abused: No    Forced sexual activity: No  Other Topics Concern  . Not on file  Social History Narrative  . Not on file    FAMILY HISTORY: Family History  Problem Relation Age of Onset  . Hypertension Father   . Pancreatic cancer Father   . CAD Mother   . Diabetes Mother   . Cancer Paternal Grandmother        unknown  . Cancer Paternal Grandfather        unknown  . Breast cancer Neg Hx     ALLERGIES:  has No Known Allergies.  MEDICATIONS:  Current  Outpatient Medications  Medication Sig Dispense Refill  . ferrous sulfate 325 (65 FE) MG tablet Take 325 mg by mouth daily with breakfast.    . hydrochlorothiazide (HYDRODIURIL) 25 MG tablet Take 25 mg by mouth daily.    Marland Kitchen losartan (COZAAR) 100 MG tablet Take 100 mg by mouth daily.    . meloxicam (MOBIC) 15 MG tablet Take by mouth.    . metoprolol succinate (TOPROL-XL) 100 MG 24 hr tablet Take 100 mg by mouth daily. Take with or immediately following a meal.    . Multiple Vitamin (MULTIVITAMIN WITH MINERALS) TABS tablet Take 1 tablet by mouth daily.    . tranexamic acid (LYSTEDA) 650 MG TABS tablet Take 2 tablets (1,300 mg total) by mouth 3 (three) times daily. Take during menses for a maximum of five days 30 tablet 3  .  vitamin B-12 (CYANOCOBALAMIN) 1000 MCG tablet Take 1 tablet (1,000 mcg total) by mouth daily. 90 tablet 1  . hydrochlorothiazide (HYDRODIURIL) 25 MG tablet Take 1 tablet (25 mg total) by mouth daily for 30 days. 30 tablet 0  . ibuprofen (ADVIL) 800 MG tablet Take 1 tablet (800 mg total) by mouth every 8 (eight) hours as needed for mild pain. (Patient not taking: Reported on 08/22/2018) 30 tablet 1  . losartan (COZAAR) 100 MG tablet Take 1 tablet (100 mg total) by mouth daily for 30 days. 30 tablet 0  . metoprolol succinate (TOPROL-XL) 100 MG 24 hr tablet Take 1 tablet (100 mg total) by mouth daily for 30 days. Take with or immediately following a meal. 30 tablet 0  . traMADol (ULTRAM) 50 MG tablet Take 1 tablet (50 mg total) by mouth every 6 (six) hours as needed. (Patient not taking: Reported on 10/14/2018) 20 tablet 0   No current facility-administered medications for this visit.      PHYSICAL EXAMINATION: ECOG PERFORMANCE STATUS: 0 - Asymptomatic Vitals:   10/14/18 1400  BP: (!) 177/111  Pulse: 79  Resp: 18  Temp: 97.8 F (36.6 C)   Filed Weights   10/14/18 1400  Weight: 230 lb 9.6 oz (104.6 kg)    Physical Exam Constitutional:      General: She is not in acute  distress. HENT:     Head: Normocephalic and atraumatic.  Eyes:     General: No scleral icterus.    Pupils: Pupils are equal, round, and reactive to light.  Neck:     Musculoskeletal: Normal range of motion and neck supple.  Cardiovascular:     Rate and Rhythm: Normal rate and regular rhythm.     Heart sounds: Normal heart sounds.  Pulmonary:     Effort: Pulmonary effort is normal. No respiratory distress.     Breath sounds: No wheezing.  Abdominal:     General: Bowel sounds are normal. There is no distension.     Palpations: Abdomen is soft. There is no mass.     Tenderness: There is no abdominal tenderness.  Musculoskeletal: Normal range of motion.        General: No deformity.  Skin:    General: Skin is warm and dry.     Findings: No erythema or rash.  Neurological:     Mental Status: She is alert and oriented to person, place, and time.     Cranial Nerves: No cranial nerve deficit.     Coordination: Coordination normal.  Psychiatric:        Behavior: Behavior normal.        Thought Content: Thought content normal.      LABORATORY DATA:  I have reviewed the data as listed Lab Results  Component Value Date   WBC 8.9 10/11/2018   HGB 14.3 10/11/2018   HCT 44.6 10/11/2018   MCV 90.3 10/11/2018   PLT 292 10/11/2018   Recent Labs    06/14/18 1419 06/15/18 0038 07/06/18 1053 07/07/18 0414 08/09/18 0833  NA 135 137 137 137  --   K 3.9 3.7 3.6 4.0 4.1  CL 106 109 104 104  --   CO2 22 22 24 26   --   GLUCOSE 96 105* 119* 91  --   BUN 9 13 22* 23*  --   CREATININE 0.93 1.16* 1.22* 1.15*  --   CALCIUM 8.3* 8.4* 8.8* 8.7*  --   GFRNONAA >60 55* 52* 56*  --  GFRAA >60 >60 >60 >60  --   PROT 7.3  --   --   --   --   ALBUMIN 3.8  --   --   --   --   AST 20  --   --   --   --   ALT 15  --   --   --   --   ALKPHOS 68  --   --   --   --   BILITOT 0.4  --   --   --   --    Iron/TIBC/Ferritin/ %Sat    Component Value Date/Time   IRON 263 (H) 10/11/2018 1136    TIBC 396 10/11/2018 1136   FERRITIN 15 10/11/2018 1136   IRONPCTSAT 66 (H) 10/11/2018 1136     RADIOGRAPHIC STUDIES: I have personally reviewed the radiological images as listed and agreed with the findings in the report. 07/07/2018 US pelvis Mild endometrial thickening in this perimenopausal patient with bleeding. Recommend GYN consultation  #07/10/2018 hemoglobinopathy evaluation reviewed normal.  ASSESSMENT & PLAN:  1. Iron deficiency anemia due to chronic blood loss   2. Low serum vitamin B12    #Labs are reviewed and discussed with patient. Hemoglobin has completely normalized to 14.3.  Iron panel showed iron saturation 66, ferritin 15, TIBC 396. No need for additional IV iron at this point. Continue oral vitamin B12 supplementation.  Will check at next visit. Recommend patient to follow-up in 1 year.   Orders Placed This Encounter  Procedures  . CBC with Differential/Platelet    Standing Status:   Future    Standing Expiration Date:   10/13/2020  . Iron and TIBC    Standing Status:   Future    Standing Expiration Date:   10/13/2020  . Ferritin    Standing Status:   Future    Standing Expiration Date:   10/13/2020  . Comprehensive metabolic panel    Standing Status:   Future    Standing Expiration Date:   10/13/2020    All questions were answered. The patient knows to call the clinic with any problems questions or concerns.  Cc Dr.[place]. Return of visit: 1 year  Rickard PatienceZhou Kindall Swaby, MD, PhD  10/15/2018

## 2018-10-17 ENCOUNTER — Other Ambulatory Visit: Payer: Self-pay

## 2018-10-17 DIAGNOSIS — Z20822 Contact with and (suspected) exposure to covid-19: Secondary | ICD-10-CM

## 2018-10-18 LAB — NOVEL CORONAVIRUS, NAA: SARS-CoV-2, NAA: NOT DETECTED

## 2019-06-30 ENCOUNTER — Other Ambulatory Visit: Payer: Self-pay

## 2019-06-30 ENCOUNTER — Emergency Department
Admission: EM | Admit: 2019-06-30 | Discharge: 2019-07-01 | Disposition: A | Discharge status: Home / Self Care | Payer: BC Managed Care – PPO | Attending: Emergency Medicine | Admitting: Emergency Medicine

## 2019-06-30 ENCOUNTER — Emergency Department: Payer: BC Managed Care – PPO

## 2019-06-30 DIAGNOSIS — I10 Essential (primary) hypertension: Secondary | ICD-10-CM | POA: Diagnosis not present

## 2019-06-30 DIAGNOSIS — Z79899 Other long term (current) drug therapy: Secondary | ICD-10-CM | POA: Diagnosis not present

## 2019-06-30 DIAGNOSIS — R519 Headache, unspecified: Secondary | ICD-10-CM | POA: Diagnosis not present

## 2019-06-30 DIAGNOSIS — R42 Dizziness and giddiness: Secondary | ICD-10-CM | POA: Diagnosis present

## 2019-06-30 DIAGNOSIS — D500 Iron deficiency anemia secondary to blood loss (chronic): Secondary | ICD-10-CM | POA: Diagnosis not present

## 2019-06-30 LAB — COMPREHENSIVE METABOLIC PANEL
ALT: 28 U/L (ref 0–44)
AST: 29 U/L (ref 15–41)
Albumin: 3.7 g/dL (ref 3.5–5.0)
Alkaline Phosphatase: 78 U/L (ref 38–126)
Anion gap: 8 (ref 5–15)
BUN: 20 mg/dL (ref 6–20)
CO2: 25 mmol/L (ref 22–32)
Calcium: 9.3 mg/dL (ref 8.9–10.3)
Chloride: 105 mmol/L (ref 98–111)
Creatinine, Ser: 1.11 mg/dL — ABNORMAL HIGH (ref 0.44–1.00)
GFR calc Af Amer: >60 mL/min (ref >60)
GFR calc non Af Amer: 58 mL/min — ABNORMAL LOW (ref >60)
Glucose, Bld: 102 mg/dL — ABNORMAL HIGH (ref 70–99)
Potassium: 4.2 mmol/L (ref 3.5–5.1)
Sodium: 138 mmol/L (ref 135–145)
Total Bilirubin: 0.5 mg/dL (ref 0.3–1.2)
Total Protein: 7.2 g/dL (ref 6.5–8.1)

## 2019-06-30 LAB — CBC
HCT: 34.9 % — ABNORMAL LOW (ref 36.0–46.0)
Hemoglobin: 10.2 g/dL — ABNORMAL LOW (ref 12.0–15.0)
MCH: 23.3 pg — ABNORMAL LOW (ref 26.0–34.0)
MCHC: 29.2 g/dL — ABNORMAL LOW (ref 30.0–36.0)
MCV: 79.7 fL — ABNORMAL LOW (ref 80.0–100.0)
Platelets: 307 10*3/uL (ref 150–400)
RBC: 4.38 MIL/uL (ref 3.87–5.11)
RDW: 17.4 % — ABNORMAL HIGH (ref 11.5–15.5)
WBC: 10.7 10*3/uL — ABNORMAL HIGH (ref 4.0–10.5)
nRBC: 0.2 % (ref 0.0–0.2)

## 2019-06-30 LAB — TROPONIN I (HIGH SENSITIVITY): Troponin I (High Sensitivity): 10 ng/L (ref <18)

## 2019-06-30 MED ORDER — LABETALOL HCL 5 MG/ML IV SOLN
10.0000 mg | Freq: Once | INTRAVENOUS | Status: AC
  Administered 2019-07-01: 10 mg via INTRAVENOUS
  Filled 2019-06-30: qty 4

## 2019-06-30 NOTE — ED Notes (Signed)
Pt found to be very hypertensive in Triage (BP 205/92). Pt reports h/x of HTN and reports compliance with r/x'd antihypertension medications.

## 2019-06-30 NOTE — ED Provider Notes (Signed)
[facility] Emergency Department Provider Note  ____________________________________________   First MD Initiated Contact with Patient 06/30/19 2324     (approximate)  I have reviewed the triage vital signs and the nursing notes.   HISTORY  Chief Complaint Dizziness    HPI Madison Lewis is a 49 y.o. female with below list of previous medical conditions including hypertension, deficiency anemia requiring blood transfusion presents to the emergency department secondary to intermittent dizziness over the past 2 weeks patient states that symptoms of worsened over the past 2 days.  Patient also admits to a generalized headache.  She states that at the time of dizziness she feels weakness on the left side of her body arm and leg.  Patient denies any chest pain no shortness of breath no palpitations.  Patient denies any recent illness no cough congestion or fever.  Patient does admit to a familial history of CVA.  No known familial history of aneurysms.        Past Medical History:  Diagnosis Date  . Blood transfusion without reported diagnosis   . Headache   . Hypertension   . Iron deficiency anemia due to chronic blood loss 07/14/2018  . Sleep apnea    CPAP ordered but can't tolerate it    Patient Active Problem List   Diagnosis Date Noted  . Iron deficiency anemia due to chronic blood loss 07/14/2018  . Anemia 07/06/2018  . Uncontrolled hypertension 06/14/2018  . Iron deficiency anemia   . Hematochezia   . Symptomatic anemia 05/17/2016  . Prediabetes 03/24/2015  . Chronic tension-type headache, not intractable 03/18/2015  . 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 Essential hypertension 03/18/2015    Past Surgical History:  Procedure Laterality Date  . CESAREAN SECTION    . COLONOSCOPY WITH PROPOFOL N/A 05/19/2016   Procedure: COLONOSCOPY WITH PROPOFOL;  Surgeon: Midge Minium, MD;  Location: [facility] ENDOSCOPY;  Service: Endoscopy;  Laterality:  N/A;  . ESOPHAGOGASTRODUODENOSCOPY (EGD) WITH PROPOFOL N/A 05/19/2016   Procedure: ESOPHAGOGASTRODUODENOSCOPY (EGD) WITH PROPOFOL;  Surgeon: Midge Minium, MD;  Location: [facility] ENDOSCOPY;  Service: Endoscopy;  Laterality: N/A;  . HYSTEROSCOPY WITH D & C N/A 08/13/2018   Procedure: DILATATION AND CURETTAGE /HYSTEROSCOPY WITH ENDOMETRIAL ABLATION;  Surgeon: Natale Milch, MD;  Location: [facility] ORS;  Service: Gynecology;  Laterality: N/A;  . KNEE SURGERY Bilateral   . TUBAL LIGATION  1997    Prior to Admission medications   Medication Sig Start Date End Date Taking? Authorizing Provider  ferrous sulfate 325 (65 FE) MG tablet Take 325 mg by mouth daily with breakfast.    [provider]  hydrochlorothiazide (HYDRODIURIL) 25 MG tablet Take 1 tablet (25 mg total) by mouth daily for 30 days. 06/16/18 08/13/18  Ihor Austin, MD  hydrochlorothiazide (HYDRODIURIL) 25 MG tablet Take 25 mg by mouth daily.    [provider]  ibuprofen (ADVIL) 800 MG tablet Take 1 tablet (800 mg total) by mouth every 8 (eight) hours as needed for mild pain. Patient not taking: Reported on 08/22/2018 08/13/18   Natale Milch, MD  losartan (COZAAR) 100 MG tablet Take 1 tablet (100 mg total) by mouth daily for 30 days. 06/16/18 08/22/18  Ihor Austin, MD  losartan (COZAAR) 100 MG tablet Take 100 mg by mouth daily.    [provider]  metoprolol succinate (TOPROL-XL) 100 MG 24 hr tablet Take 1 tablet (100 mg total) by mouth daily for 30 days. Take with or immediately following a meal. 06/16/18  08/22/18  Saundra Shelling, MD  metoprolol succinate (TOPROL-XL) 100 MG 24 hr tablet Take 100 mg by mouth daily. Take with or immediately following a meal.    [provider]  Multiple Vitamin (MULTIVITAMIN WITH MINERALS) TABS tablet Take 1 tablet by mouth daily. 05/20/16   Gouru, Illene Silver, MD  traMADol (ULTRAM) 50 MG tablet Take 1 tablet (50 mg total) by mouth every 6 (six) hours as needed. Patient not  taking: Reported on 10/14/2018 08/13/18 08/13/19  [name], MD  tranexamic acid (LYSTEDA) 650 MG TABS tablet Take 2 tablets (1,300 mg total) by mouth 3 (three) times daily. Take during menses for a maximum of five days 07/26/18   [name], MD  vitamin B-12 (CYANOCOBALAMIN) 1000 MCG tablet Take 1 tablet (1,000 mcg total) by mouth daily. 10/15/18   [name], MD    Allergies Patient has no known allergies.  Family History  Problem Relation Age of Onset  . Hypertension Father   . Pancreatic cancer Father   . CAD Mother   . Diabetes Mother   . Cancer Paternal Grandmother        unknown  . Cancer Paternal Grandfather        unknown  . Breast cancer Neg Hx     Social History Social History   Tobacco Use  . Smoking status: Never Smoker  . Smokeless tobacco: Never Used  Vaping Use  . Vaping Use: Never used  Substance Use Topics  . Alcohol use: Yes    Alcohol/week: 1.0 standard drink    Types: 1 Cans of beer per week  . Drug use: No    Review of Systems Constitutional: No fever/chills Eyes: No visual changes. ENT: No sore throat. Cardiovascular: Denies chest pain. Respiratory: Denies shortness of breath. Gastrointestinal: No abdominal pain.  No nausea, no vomiting.  No diarrhea.  No constipation. Genitourinary: Negative for dysuria. Musculoskeletal: Negative for neck pain.  Negative for back pain. Integumentary: Negative for rash. Neurological: Positive for headaches, dizziness   ____________________________________________   PHYSICAL EXAM:  VITAL SIGNS: ED Triage Vitals  Enc Vitals Group     BP 06/30/19 1908 (!) 205/92     Pulse Rate 06/30/19 1908 89     Resp 06/30/19 1908 16     Temp 06/30/19 1908 98.9 F (37.2 C)     Temp Source 06/30/19 1908 Oral     SpO2 06/30/19 1908 100 %     Weight 06/30/19 1905 99.8 kg (220 lb)     Height 06/30/19 1905 1.626 m (5\' 4" )     Head Circumference --      Peak Flow --      Pain Score 06/30/19 1905 0      Pain Loc --      Pain Edu? --      Excl. in [place]? --     Constitutional: Alert and oriented.  Eyes: Conjunctivae are normal.  Head: Atraumatic. Mouth/Throat: Patient is wearing a mask. Neck: No stridor.  No meningeal signs.   Cardiovascular: Normal rate, regular rhythm. Good peripheral circulation. Grossly normal heart sounds. Respiratory: Normal respiratory effort.  No retractions. Gastrointestinal: Soft and nontender. No distention.  Musculoskeletal: No lower extremity tenderness nor edema. No gross deformities of extremities. Neurologic:  Normal speech and language. No gross focal neurologic deficits are appreciated.  Skin:  Skin is warm, dry and intact. Psychiatric: Mood and affect are normal. Speech and behavior are normal.  ____________________________________________   LABS (all labs ordered are listed,  but only abnormal results are displayed)  Labs Reviewed  CBC - Abnormal; Notable for the following components:      Result Value   WBC 10.7 (*)    Hemoglobin 10.2 (*)    HCT 34.9 (*)    MCV 79.7 (*)    MCH 23.3 (*)    MCHC 29.2 (*)    RDW 17.4 (*)    All other components within normal limits  COMPREHENSIVE METABOLIC PANEL - Abnormal; Notable for the following components:   Glucose, Bld 102 (*)    Creatinine, Ser 1.11 (*)    GFR calc non Af Amer 58 (*)    All other components within normal limits  TROPONIN I (HIGH SENSITIVITY)  TROPONIN I (HIGH SENSITIVITY)   ____________________________________________  EKG  ED ECG REPORT I, [place] Tamryn Popko, the attending physician, personally viewed and interpreted this ECG.   Date: 06/30/2019  EKG Time: 7:06 PM  Rate: 84  Rhythm: Normal sinus rhythm  Axis: Normal  Intervals: Normal  ST&T Change: None  ____________________________________________  RADIOLOGY I, [place] Aiyana Stegmann, personally viewed and evaluated these images (plain radiographs) as part of my medical decision making, as well as reviewing the written  report by the radiologist.  ED MD interpretation: No acute cardiopulmonary disease noted on chest x-ray.  MRI brain/MRA brain revealed a normal MRI no acute intracranial abnormality or find to explain the patient's symptoms.  Normal intracranial MRA  per radiologist.  Official radiology report(s): DG Chest 2 View  Result Date: 06/30/2019 CLINICAL DATA:  Shortness of breath.  Dizziness. EXAM: CHEST - 2 VIEW COMPARISON:  None. FINDINGS: Heart size and pulmonary vascularity are normal lungs are clear. No effusions. Large hiatal hernia. No bone abnormality. IMPRESSION: No acute cardiopulmonary disease. Large hiatal hernia. Electronically Signed   By: Francene Boyers M.D.   On: 06/30/2019 19:35   MR ANGIO HEAD WO CONTRAST  Result Date: 07/01/2019 CLINICAL DATA:  Initial evaluation for acute headache. EXAM: MRI HEAD WITHOUT CONTRAST MRA HEAD WITHOUT CONTRAST TECHNIQUE: Multiplanar, multiecho pulse sequences of the brain and surrounding structures were obtained without intravenous contrast. Angiographic images of the head were obtained using MRA technique without contrast. COMPARISON:  None available. FINDINGS: MRI HEAD FINDINGS Brain: Cerebral volume within normal limits for age. Few scattered punctate foci of T2/FLAIR hyperintensity noted within the supratentorial cerebral white matter, nonspecific, but felt to be within normal limits for age. No abnormal foci of restricted diffusion to suggest acute or subacute ischemia. Gray-white matter differentiation maintained. No encephalomalacia to suggest chronic cortical infarction. No foci of susceptibility artifact to suggest acute or chronic intracranial hemorrhage. No mass lesion, midline shift or mass effect. No hydrocephalus or extra-axial fluid collection. Pituitary gland suprasellar region normal. Midline structures intact. Vascular: Major intracranial vascular flow voids are well maintained. Skull and upper cervical spine: Craniocervical junction within  normal limits. Upper cervical spine normal. Bone marrow signal intensity within normal limits. No scalp soft tissue abnormality. Sinuses/Orbits: Globes orbital soft tissues within normal limits. Paranasal sinuses are largely clear. No mastoid effusion. Inner ear structures grossly normal. Other: None. MRA HEAD FINDINGS ANTERIOR CIRCULATION: Examination degraded by motion artifact. Visualized distal cervical segments of the internal carotid arteries are patent with symmetric antegrade flow. Petrous, cavernous, and supraclinoid ICAs patent without stenosis or other abnormality. A1 segments widely patent. Grossly normal anterior communicating artery complex. Anterior cerebral arteries patent to their distal aspects without stenosis. No M1 stenosis or occlusion. Normal MCA bifurcations. Distal MCA branches well perfused and  symmetric. POSTERIOR CIRCULATION: Both vertebral arteries widely patent to the vertebrobasilar junction without stenosis. Both picas patent. Basilar widely patent to its distal aspect without stenosis. Superior cerebral arteries patent bilaterally. Both PCAs well perfused to their distal aspects without stenosis. Prominent right posterior communicating artery noted. No intracranial aneurysm or other vascular abnormality. IMPRESSION: 1. Normal brain MRI for age. No acute intracranial abnormality or findings to explain patient's symptoms identified. 2. Normal intracranial MRA. Electronically Signed   By: Rise Mu M.D.   On: 07/01/2019 02:16   MR BRAIN WO CONTRAST  Result Date: 07/01/2019 CLINICAL DATA:  Initial evaluation for acute headache. EXAM: MRI HEAD WITHOUT CONTRAST MRA HEAD WITHOUT CONTRAST TECHNIQUE: Multiplanar, multiecho pulse sequences of the brain and surrounding structures were obtained without intravenous contrast. Angiographic images of the head were obtained using MRA technique without contrast. COMPARISON:  None available. FINDINGS: MRI HEAD FINDINGS Brain: Cerebral  volume within normal limits for age. Few scattered punctate foci of T2/FLAIR hyperintensity noted within the supratentorial cerebral white matter, nonspecific, but felt to be within normal limits for age. No abnormal foci of restricted diffusion to suggest acute or subacute ischemia. Gray-white matter differentiation maintained. No encephalomalacia to suggest chronic cortical infarction. No foci of susceptibility artifact to suggest acute or chronic intracranial hemorrhage. No mass lesion, midline shift or mass effect. No hydrocephalus or extra-axial fluid collection. Pituitary gland suprasellar region normal. Midline structures intact. Vascular: Major intracranial vascular flow voids are well maintained. Skull and upper cervical spine: Craniocervical junction within normal limits. Upper cervical spine normal. Bone marrow signal intensity within normal limits. No scalp soft tissue abnormality. Sinuses/Orbits: Globes orbital soft tissues within normal limits. Paranasal sinuses are largely clear. No mastoid effusion. Inner ear structures grossly normal. Other: None. MRA HEAD FINDINGS ANTERIOR CIRCULATION: Examination degraded by motion artifact. Visualized distal cervical segments of the internal carotid arteries are patent with symmetric antegrade flow. Petrous, cavernous, and supraclinoid ICAs patent without stenosis or other abnormality. A1 segments widely patent. Grossly normal anterior communicating artery complex. Anterior cerebral arteries patent to their distal aspects without stenosis. No M1 stenosis or occlusion. Normal MCA bifurcations. Distal MCA branches well perfused and symmetric. POSTERIOR CIRCULATION: Both vertebral arteries widely patent to the vertebrobasilar junction without stenosis. Both picas patent. Basilar widely patent to its distal aspect without stenosis. Superior cerebral arteries patent bilaterally. Both PCAs well perfused to their distal aspects without stenosis. Prominent right  posterior communicating artery noted. No intracranial aneurysm or other vascular abnormality. IMPRESSION: 1. Normal brain MRI for age. No acute intracranial abnormality or findings to explain patient's symptoms identified. 2. Normal intracranial MRA. Electronically Signed   By: Rise Mu M.D.   On: 07/01/2019 02:16     Procedures   ____________________________________________   INITIAL IMPRESSION / MDM / ASSESSMENT AND PLAN / ED COURSE  As part of my medical decision making, I reviewed the following data within the electronic MEDICAL RECORD NUMBER   49 year old female presented with above-stated history and physical exam a differential diagnosis including but not limited to CVA, cerebral aneurysm, posterior circulation pathology including aneurysm, hypertensive urgency/emergency.  Patient noted to be markedly hypertensive on arrival systolic exceeding 205/92 urine my evaluation.  As such patient was given labetalol 10 mg IV with resultant blood pressure 170/89.  MRI/MRA of the brain was performed which was normal per radiologist.  Patient's laboratory data did reveal evidence of anemia with a hemoglobin of 10.2 patient's last hemoglobin on record from 10/11/2018 was 14.  ____________________________________________  FINAL  CLINICAL IMPRESSION(S) / ED DIAGNOSES  Final diagnoses:  Dizziness  Essential hypertension     MEDICATIONS GIVEN DURING THIS VISIT:  Medications  labetalol (NORMODYNE) injection 10 mg (10 mg Intravenous Given 07/01/19 0011)  LORazepam (ATIVAN) injection 1 mg (1 mg Intravenous Given 07/01/19 0043)     ED Discharge Orders    None      *Please note:  Erin SonsShaneisaka Ambriz was evaluated in Emergency Department on 07/01/2019 for the symptoms described in the history of present illness. She was evaluated in the context of the global COVID-19 pandemic, which necessitated consideration that the patient might be at risk for infection with the SARS-CoV-2 virus that causes  COVID-19. Institutional protocols and algorithms that pertain to the evaluation of patients at risk for COVID-19 are in a state of rapid change based on information released by regulatory bodies including the CDC and federal and state organizations. These policies and algorithms were followed during the patient's care in the ED.  Some ED evaluations and interventions may be delayed as a result of limited staffing during the pandemic.*  Note:  This document was prepared using Dragon voice recognition software and may include unintentional dictation errors.   Darci CurrentBrown, [place], MD 07/01/19 [phone]

## 2019-06-30 NOTE — ED Triage Notes (Addendum)
Pt arrives to ED via POV from home with c/o dizziness x2 days. Pt states it originially started happening once a day, but became concerned when it started happening 4-5 times a day. Pt reports left-sided "heaviness"; no reported dysphagia, trouble speaking. Pt currently with c/o frontal headache; no photo/phonophobia. No c/o N/V/D or fever. Pt denies CP, (+) occasional SHOB. Pt is A&O, in NAD; RR even, regular, and unlabored.

## 2019-07-01 ENCOUNTER — Emergency Department: Payer: BC Managed Care – PPO

## 2019-07-01 LAB — TROPONIN I (HIGH SENSITIVITY): Troponin I (High Sensitivity): 11 ng/L (ref <18)

## 2019-07-01 MED ORDER — CLONIDINE HCL 0.1 MG PO TABS: 0.1000 mg | ORAL_TABLET | Freq: Once | ORAL | Status: DC

## 2019-07-01 MED ORDER — LORAZEPAM 2 MG/ML IJ SOLN: 1.0000 mg | Freq: Once | INTRAMUSCULAR | Status: AC

## 2019-07-01 MED ORDER — LORAZEPAM 2 MG/ML IJ SOLN
INTRAMUSCULAR | Status: AC
  Administered 2019-07-01: 1 mg via INTRAVENOUS
  Filled 2019-07-01: qty 1

## 2019-07-01 NOTE — ED Notes (Signed)
Pt transported to MRI 

## 2019-07-10 ENCOUNTER — Ambulatory Visit
Admission: EM | Admit: 2019-07-10 | Discharge: 2019-07-10 | Disposition: A | Discharge status: Home / Self Care | Payer: BC Managed Care – PPO | Attending: Internal Medicine | Admitting: Internal Medicine

## 2019-07-10 ENCOUNTER — Other Ambulatory Visit: Payer: Self-pay

## 2019-07-10 DIAGNOSIS — M5431 Sciatica, right side: Secondary | ICD-10-CM | POA: Diagnosis not present

## 2019-07-10 MED ORDER — PREDNISONE 10 MG (21) PO TBPK
ORAL_TABLET | Freq: Every day | ORAL | 0 refills | Status: DC
Start: 2019-07-10 — End: 2019-08-18

## 2019-07-10 MED ORDER — ACETAMINOPHEN 500 MG PO TABS
500.0000 mg | ORAL_TABLET | Freq: Four times a day (QID) | ORAL | 0 refills | Status: DC | PRN
Start: 2019-07-10 — End: 2020-12-18

## 2019-07-10 NOTE — ED Triage Notes (Signed)
Patient complains of right sided sciatic pain that started around 5-6 days ago and has been constant. States that the pain radiates all the way down her leg.

## 2019-07-10 NOTE — ED Provider Notes (Signed)
[facility]    CSN: [id number] Arrival date & time: 07/10/19  1441      History   Chief Complaint Chief Complaint  Patient presents with  . Sciatica    HPI Madison Lewis is a 49 y.o. female comes to the urgent care with sharp pain shooting into the right leg.  Symptoms started about a week ago and has been persistent.  No fall.  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  No back pain or pressure.  No weakness in the right leg.  No numbness or tingling.  Patient has history of intermittent dizziness with left-sided numbness which is transient.  Patient is currently awaiting consultation with a neurologist.  No headaches, blurry vision or double vision.   HPI  Past Medical History:  Diagnosis Date  . Blood transfusion without reported diagnosis   . Headache   . Hypertension   . Iron deficiency anemia due to chronic blood loss 07/14/2018  . Sleep apnea    CPAP ordered but can't tolerate it    Patient Active Problem List   Diagnosis Date Noted  . Iron deficiency anemia due to chronic blood loss 07/14/2018  . Anemia 07/06/2018  . Uncontrolled hypertension 06/14/2018  . Iron deficiency anemia   . Hematochezia   . Symptomatic anemia 05/17/2016  . Prediabetes 03/24/2015  . Chronic tension-type headache, not intractable 03/18/2015  . 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 Essential hypertension 03/18/2015    Past Surgical History:  Procedure Laterality Date  . CESAREAN SECTION    . COLONOSCOPY WITH PROPOFOL N/A 05/19/2016   Procedure: COLONOSCOPY WITH PROPOFOL;  Surgeon: Midge Minium, MD;  Location: [facility] ENDOSCOPY;  Service: Endoscopy;  Laterality: N/A;  . ESOPHAGOGASTRODUODENOSCOPY (EGD) WITH PROPOFOL N/A 05/19/2016   Procedure: ESOPHAGOGASTRODUODENOSCOPY (EGD) WITH PROPOFOL;  Surgeon: Midge Minium, MD;  Location: [facility] ENDOSCOPY;  Service: Endoscopy;  Laterality: N/A;  . HYSTEROSCOPY WITH D & C N/A 08/13/2018   Procedure: DILATATION  AND CURETTAGE /HYSTEROSCOPY WITH ENDOMETRIAL ABLATION;  Surgeon: Natale Milch, MD;  Location: [facility] ORS;  Service: Gynecology;  Laterality: N/A;  . KNEE SURGERY Bilateral   . TUBAL LIGATION  1997    OB History    Gravida  4   Para  3   Term  3   Preterm      AB  1   Living  3     SAB  1   TAB      Ectopic      Multiple      Live Births  3        Obstetric Comments  S/p BTL         Home Medications    Prior to Admission medications   Medication Sig Start Date End Date Taking? Authorizing Provider  ferrous sulfate 325 (65 FE) MG tablet Take 325 mg by mouth daily with breakfast.   Yes [provider]  hydrochlorothiazide (HYDRODIURIL) 25 MG tablet Take 25 mg by mouth daily.   Yes [provider]  losartan (COZAAR) 100 MG tablet Take 100 mg by mouth daily.   Yes [provider]  metoprolol succinate (TOPROL-XL) 100 MG 24 hr tablet Take 100 mg by mouth daily. Take with or immediately following a meal.   Yes [provider]  Multiple Vitamin (MULTIVITAMIN WITH MINERALS) TABS tablet Take 1 tablet by mouth daily. 05/20/16  Yes Gouru, Deanna Artis, MD  traMADol (ULTRAM) 50 MG tablet Take 1 tablet (50 mg  total) by mouth every 6 (six) hours as needed. 08/13/18 08/13/19 Yes Schuman, Christanna R, MD  tranexamic acid (LYSTEDA) 650 MG TABS tablet Take 2 tablets (1,300 mg total) by mouth 3 (three) times daily. Take during menses for a maximum of five days 07/26/18  Yes Schuman, Christanna R, MD  vitamin B-12 (CYANOCOBALAMIN) 1000 MCG tablet Take 1 tablet (1,000 mcg total) by mouth daily. 10/15/18  Yes Rickard Patience, MD  acetaminophen (TYLENOL) 500 MG tablet Take 1 tablet (500 mg total) by mouth every 6 (six) hours as needed. 07/10/19   Kimber Fritts, Britta Mccreedy, MD  hydrochlorothiazide (HYDRODIURIL) 25 MG tablet Take 1 tablet (25 mg total) by mouth daily for 30 days. 06/16/18 08/13/18  Ihor Austin, MD  losartan (COZAAR) 100 MG tablet Take 1 tablet (100 mg total)  by mouth daily for 30 days. 06/16/18 08/22/18  Ihor Austin, MD  metoprolol succinate (TOPROL-XL) 100 MG 24 hr tablet Take 1 tablet (100 mg total) by mouth daily for 30 days. Take with or immediately following a meal. 06/16/18 08/22/18  Pyreddy, Vivien Rota, MD  predniSONE (STERAPRED UNI-PAK 21 TAB) 10 MG (21) TBPK tablet Take by mouth daily. Take 6 tabs by mouth daily  for 2 days, then 5 tabs for 2 days, then 4 tabs for 2 days, then 3 tabs for 2 days, 2 tabs for 2 days, then 1 tab by mouth daily for 2 days 07/10/19   Merrilee Jansky, MD    Family History Family History  Problem Relation Age of Onset  . Hypertension Father   . Pancreatic cancer Father   . CAD Mother   . Diabetes Mother   . Cancer Paternal Grandmother        unknown  . Cancer Paternal Grandfather        unknown  . Breast cancer Neg Hx     Social History Social History   Tobacco Use  . Smoking status: Never Smoker  . Smokeless tobacco: Never Used  Vaping Use  . Vaping Use: Never used  Substance Use Topics  . Alcohol use: Yes    Alcohol/week: 1.0 standard drink    Types: 1 Cans of beer per week  . Drug use: No     Allergies   Patient has no known allergies.   Review of Systems Review of Systems  Eyes: Negative.   Cardiovascular: Negative for chest pain and palpitations.  Gastrointestinal: Negative for abdominal pain, nausea and vomiting.  Musculoskeletal: Negative for arthralgias, back pain, joint swelling, myalgias, neck pain and neck stiffness.  Skin: Negative.   Neurological: Negative for dizziness, weakness, light-headedness, numbness and headaches.     Physical Exam Triage Vital Signs ED Triage Vitals  Enc Vitals Group     BP 07/10/19 1504 (!) 156/92     Pulse Rate 07/10/19 1504 83     Resp 07/10/19 1504 18     Temp 07/10/19 1504 98.1 F (36.7 C)     Temp Source 07/10/19 1504 Oral     SpO2 07/10/19 1504 97 %     Weight 07/10/19 1502 220 lb (99.8 kg)     Height 07/10/19 1502 5\' 4"  (1.626 m)      Head Circumference --      Peak Flow --      Pain Score 07/10/19 1501 4     Pain Loc --      Pain Edu? --      Excl. in GC? --    No data found.  Updated Vital  Signs BP (!) 156/92 (BP Location: Right Arm)   Pulse 83   Temp 98.1 F (36.7 C) (Oral)   Resp 18   Ht 5\' 4"  (1.626 m)   Wt 99.8 kg   SpO2 97%   BMI 37.76 kg/m   Visual Acuity Right Eye Distance:   Left Eye Distance:   Bilateral Distance:    Right Eye Near:   Left Eye Near:    Bilateral Near:     Physical Exam Vitals and nursing note reviewed.  Constitutional:      Appearance: Normal appearance.  Cardiovascular:     Rate and Rhythm: Normal rate and regular rhythm.     Pulses: Normal pulses.     Heart sounds: Normal heart sounds.  Pulmonary:     Effort: Pulmonary effort is normal.     Breath sounds: Normal breath sounds.  Abdominal:     General: Bowel sounds are normal. There is no distension.     Palpations: Abdomen is soft.     Tenderness: There is no abdominal tenderness.  Musculoskeletal:        General: No swelling, tenderness or signs of injury. Normal range of motion.  Skin:    General: Skin is warm and dry.     Capillary Refill: Capillary refill takes less than 2 seconds.  Neurological:     General: No focal deficit present.     Mental Status: She is alert and oriented to person, place, and time.     Motor: No weakness.     Coordination: Coordination normal.     Deep Tendon Reflexes: Reflexes normal.      UC Treatments / Results  Labs (all labs ordered are listed, but only abnormal results are displayed) Labs Reviewed - No data to display  EKG   Radiology No results found.  Procedures Procedures (including critical care time)  Medications Ordered in UC Medications - No data to display  Initial Impression / Assessment and Plan / UC Course  I have reviewed the triage vital signs and the nursing notes.  Pertinent labs & imaging results that were available during my care of the  patient were reviewed by me and considered in my medical decision making (see chart for details).     1.  Sciatica of the right side: Tapering dose of prednisone Tylenol as needed for pain Gentle range of motion exercises Heat therapy Return to urgent care if symptoms worsen  Final Clinical Impressions(s) / UC Diagnoses   Final diagnoses:  Sciatica of right side   Discharge Instructions   None    ED Prescriptions    Medication Sig Dispense Auth. Provider   predniSONE (STERAPRED UNI-PAK 21 TAB) 10 MG (21) TBPK tablet Take by mouth daily. Take 6 tabs by mouth daily  for 2 days, then 5 tabs for 2 days, then 4 tabs for 2 days, then 3 tabs for 2 days, 2 tabs for 2 days, then 1 tab by mouth daily for 2 days 42 tablet Carah Barrientes, Myrene Galas, MD   acetaminophen (TYLENOL) 500 MG tablet Take 1 tablet (500 mg total) by mouth every 6 (six) hours as needed. 30 tablet Rosalin Buster, Myrene Galas, MD     PDMP not reviewed this encounter.   Chase Picket, MD 07/10/19 [phone]

## 2019-08-18 ENCOUNTER — Ambulatory Visit
Admission: EM | Admit: 2019-08-18 | Discharge: 2019-08-18 | Disposition: A | Discharge status: Home / Self Care | Payer: BC Managed Care – PPO

## 2019-08-18 ENCOUNTER — Inpatient Hospital Stay
Admission: EM | Admit: 2019-08-18 | Discharge: 2019-08-20 | DRG: 309 | Disposition: A | Discharge status: Home / Self Care | Payer: BC Managed Care – PPO | Attending: Internal Medicine | Admitting: Internal Medicine

## 2019-08-18 ENCOUNTER — Encounter: Payer: Self-pay | Admitting: Emergency Medicine

## 2019-08-18 ENCOUNTER — Other Ambulatory Visit: Payer: Self-pay

## 2019-08-18 ENCOUNTER — Emergency Department: Payer: BC Managed Care – PPO

## 2019-08-18 DIAGNOSIS — I248 Other forms of acute ischemic heart disease: Secondary | ICD-10-CM | POA: Diagnosis present

## 2019-08-18 DIAGNOSIS — F329 Major depressive disorder, single episode, unspecified: Secondary | ICD-10-CM | POA: Diagnosis present

## 2019-08-18 DIAGNOSIS — Z6837 Body mass index (BMI) 37.0-37.9, adult: Secondary | ICD-10-CM

## 2019-08-18 DIAGNOSIS — R079 Chest pain, unspecified: Secondary | ICD-10-CM

## 2019-08-18 DIAGNOSIS — Z20822 Contact with and (suspected) exposure to covid-19: Secondary | ICD-10-CM | POA: Diagnosis present

## 2019-08-18 DIAGNOSIS — E669 Obesity, unspecified: Secondary | ICD-10-CM | POA: Diagnosis present

## 2019-08-18 DIAGNOSIS — I4891 Unspecified atrial fibrillation: Secondary | ICD-10-CM | POA: Diagnosis not present

## 2019-08-18 DIAGNOSIS — I10 Essential (primary) hypertension: Secondary | ICD-10-CM | POA: Diagnosis not present

## 2019-08-18 DIAGNOSIS — E789 Disorder of lipoprotein metabolism, unspecified: Secondary | ICD-10-CM | POA: Diagnosis present

## 2019-08-18 DIAGNOSIS — N920 Excessive and frequent menstruation with regular cycle: Secondary | ICD-10-CM | POA: Diagnosis present

## 2019-08-18 DIAGNOSIS — F419 Anxiety disorder, unspecified: Secondary | ICD-10-CM | POA: Diagnosis present

## 2019-08-18 DIAGNOSIS — N179 Acute kidney failure, unspecified: Secondary | ICD-10-CM | POA: Diagnosis present

## 2019-08-18 DIAGNOSIS — D649 Anemia, unspecified: Secondary | ICD-10-CM | POA: Diagnosis present

## 2019-08-18 DIAGNOSIS — G4733 Obstructive sleep apnea (adult) (pediatric): Secondary | ICD-10-CM | POA: Diagnosis present

## 2019-08-18 DIAGNOSIS — R0789 Other chest pain: Secondary | ICD-10-CM | POA: Diagnosis not present

## 2019-08-18 DIAGNOSIS — R7303 Prediabetes: Secondary | ICD-10-CM | POA: Diagnosis present

## 2019-08-18 DIAGNOSIS — D500 Iron deficiency anemia secondary to blood loss (chronic): Secondary | ICD-10-CM | POA: Diagnosis present

## 2019-08-18 DIAGNOSIS — Z79899 Other long term (current) drug therapy: Secondary | ICD-10-CM

## 2019-08-18 DIAGNOSIS — Z8249 Family history of ischemic heart disease and other diseases of the circulatory system: Secondary | ICD-10-CM

## 2019-08-18 LAB — BASIC METABOLIC PANEL
Anion gap: 7 (ref 5–15)
BUN: 13 mg/dL (ref 6–20)
CO2: 22 mmol/L (ref 22–32)
Calcium: 8.4 mg/dL — ABNORMAL LOW (ref 8.9–10.3)
Chloride: 106 mmol/L (ref 98–111)
Creatinine, Ser: 1.01 mg/dL — ABNORMAL HIGH (ref 0.44–1.00)
GFR calc Af Amer: >60 mL/min (ref >60)
GFR calc non Af Amer: >60 mL/min (ref >60)
Glucose, Bld: 114 mg/dL — ABNORMAL HIGH (ref 70–99)
Potassium: 3.9 mmol/L (ref 3.5–5.1)
Sodium: 135 mmol/L (ref 135–145)

## 2019-08-18 LAB — CBC
HCT: 31.4 % — ABNORMAL LOW (ref 36.0–46.0)
Hemoglobin: 8.8 g/dL — ABNORMAL LOW (ref 12.0–15.0)
MCH: 20.3 pg — ABNORMAL LOW (ref 26.0–34.0)
MCHC: 28 g/dL — ABNORMAL LOW (ref 30.0–36.0)
MCV: 72.5 fL — ABNORMAL LOW (ref 80.0–100.0)
Platelets: 295 10*3/uL (ref 150–400)
RBC: 4.33 MIL/uL (ref 3.87–5.11)
RDW: 19 % — ABNORMAL HIGH (ref 11.5–15.5)
WBC: 9 10*3/uL (ref 4.0–10.5)
nRBC: 1.2 % — ABNORMAL HIGH (ref 0.0–0.2)

## 2019-08-18 LAB — IRON AND TIBC
Iron: 16 ug/dL — ABNORMAL LOW (ref 28–170)
Saturation Ratios: 4 % — ABNORMAL LOW (ref 10.4–31.8)
TIBC: 420 ug/dL (ref 250–450)
UIBC: 404 ug/dL

## 2019-08-18 LAB — TROPONIN I (HIGH SENSITIVITY)
Troponin I (High Sensitivity): 42 ng/L — ABNORMAL HIGH (ref <18)
Troponin I (High Sensitivity): 1002 ng/L (ref <18)

## 2019-08-18 LAB — FERRITIN: Ferritin: 5 ng/mL — ABNORMAL LOW (ref 11–307)

## 2019-08-18 LAB — TSH: TSH: 1.031 u[IU]/mL (ref 0.350–4.500)

## 2019-08-18 LAB — T4, FREE: Free T4: 0.8 ng/dL (ref 0.61–1.12)

## 2019-08-18 LAB — SARS CORONAVIRUS 2 BY RT PCR (HOSPITAL ORDER, PERFORMED IN ~~LOC~~ HOSPITAL LAB): SARS Coronavirus 2: NEGATIVE

## 2019-08-18 LAB — BRAIN NATRIURETIC PEPTIDE: B Natriuretic Peptide: 170.1 pg/mL — ABNORMAL HIGH (ref 0.0–100.0)

## 2019-08-18 MED ORDER — ACETAMINOPHEN 325 MG PO TABS
650.0000 mg | ORAL_TABLET | Freq: Once | ORAL | Status: AC
  Administered 2019-08-18: 650 mg via ORAL
  Filled 2019-08-18: qty 2

## 2019-08-18 MED ORDER — RIVAROXABAN 20 MG PO TABS
20.0000 mg | ORAL_TABLET | Freq: Every day | ORAL | Status: DC
  Administered 2019-08-18: 20 mg via ORAL
  Filled 2019-08-18: qty 1

## 2019-08-18 MED ORDER — FERROUS SULFATE 325 (65 FE) MG PO TABS
325.0000 mg | ORAL_TABLET | Freq: Every day | ORAL | Status: DC
  Administered 2019-08-19 – 2019-08-20 (×2): 325 mg via ORAL
  Filled 2019-08-18 (×2): qty 1

## 2019-08-18 MED ORDER — TRAMADOL HCL 50 MG PO TABS
50.0000 mg | ORAL_TABLET | Freq: Once | ORAL | Status: AC
  Administered 2019-08-18: 50 mg via ORAL
  Filled 2019-08-18: qty 1

## 2019-08-18 MED ORDER — ONDANSETRON HCL 4 MG/2ML IJ SOLN: 4.0000 mg | Freq: Four times a day (QID) | INTRAMUSCULAR | Status: DC | PRN

## 2019-08-18 MED ORDER — ASPIRIN 81 MG PO CHEW
324.0000 mg | CHEWABLE_TABLET | Freq: Once | ORAL | Status: AC
  Administered 2019-08-18: 324 mg via ORAL

## 2019-08-18 MED ORDER — DILTIAZEM HCL-DEXTROSE 125-5 MG/125ML-% IV SOLN (PREMIX)
5.0000 mg/h | INTRAVENOUS | Status: DC
  Administered 2019-08-18: 5 mg/h via INTRAVENOUS
  Filled 2019-08-18 (×2): qty 125

## 2019-08-18 MED ORDER — FLUOXETINE HCL 10 MG PO CAPS
10.0000 mg | ORAL_CAPSULE | Freq: Every day | ORAL | Status: DC
  Administered 2019-08-20: 10 mg via ORAL
  Filled 2019-08-18 (×2): qty 1

## 2019-08-18 MED ORDER — ACETAMINOPHEN 325 MG PO TABS: 650.0000 mg | ORAL_TABLET | ORAL | Status: DC | PRN

## 2019-08-18 MED ORDER — DILTIAZEM HCL 25 MG/5ML IV SOLN
20.0000 mg | Freq: Once | INTRAVENOUS | Status: AC
  Administered 2019-08-18: 20 mg via INTRAVENOUS
  Filled 2019-08-18: qty 5

## 2019-08-18 MED ORDER — METOPROLOL SUCCINATE ER 100 MG PO TB24
100.0000 mg | ORAL_TABLET | Freq: Every day | ORAL | Status: DC
  Administered 2019-08-18 – 2019-08-20 (×3): 100 mg via ORAL
  Filled 2019-08-18: qty 2
  Filled 2019-08-18: qty 1
  Filled 2019-08-18: qty 2

## 2019-08-18 MED ORDER — HEPARIN (PORCINE) 25000 UT/250ML-% IV SOLN: 900.0000 [IU]/h | INTRAVENOUS | Status: DC

## 2019-08-18 NOTE — Discharge Instructions (Addendum)
TO ER via EMS for further evaluation and treatment.

## 2019-08-18 NOTE — ED Notes (Signed)
Patient is being discharged from the Urgent Care and sent to the Emergency Department via EMS . Per Para March, NP patient is in need of higher level of care due to new onset A-Fib with RVR. Patient is aware and verbalizes understanding of plan of care.  Vitals:   08/18/19 1427  BP: 137/90  Pulse: 94  Resp: 20  Temp: 98.1 F (36.7 C)  SpO2: 97%

## 2019-08-18 NOTE — ED Provider Notes (Signed)
[facility] Emergency Department Provider Note   ____________________________________________    I have reviewed the triage vital signs and the nursing notes.   HISTORY  Chief Complaint Chest Pain and Near Syncope     HPI Madison Lewis is a 49 y.o. female with a history as noted below who presents with complaints of palpitations, dizziness and lightheadedness.  She reports she had a near syncopal episode while at work today.  She reports she was standing up drinking some cold water when she began feeling lightheaded.  She denies chest pain.  She reports this is happened several times over the last few months, has never been evaluated.  No history of arrhythmias.  No nausea or vomiting or diaphoresis.  No new medications.  No fevers or chills or cough.  Has been vaccinated against COVID-19  Past Medical History:  Diagnosis Date  . Blood transfusion without reported diagnosis   . Headache   . Hypertension   . Iron deficiency anemia due to chronic blood loss 07/14/2018  . Sleep apnea    CPAP ordered but can't tolerate it    Patient Active Problem List   Diagnosis Date Noted  . Chest pain 08/18/2019  . Atrial fibrillation with RVR (HCC) 08/18/2019  . Iron deficiency anemia due to chronic blood loss 07/14/2018  . Anemia 07/06/2018  . Uncontrolled hypertension 06/14/2018  . Iron deficiency anemia   . Hematochezia   . Symptomatic anemia 05/17/2016  . Prediabetes 03/24/2015  . Chronic tension-type headache, not intractable 03/18/2015  . 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 Essential hypertension 03/18/2015    Past Surgical History:  Procedure Laterality Date  . CESAREAN SECTION    . COLONOSCOPY WITH PROPOFOL N/A 05/19/2016   Procedure: COLONOSCOPY WITH PROPOFOL;  Surgeon: Midge Minium, MD;  Location: [facility] ENDOSCOPY;  Service: Endoscopy;  Laterality: N/A;  . ESOPHAGOGASTRODUODENOSCOPY (EGD) WITH PROPOFOL N/A 05/19/2016   Procedure:  ESOPHAGOGASTRODUODENOSCOPY (EGD) WITH PROPOFOL;  Surgeon: Midge Minium, MD;  Location: [facility] ENDOSCOPY;  Service: Endoscopy;  Laterality: N/A;  . HYSTEROSCOPY WITH D & C N/A 08/13/2018   Procedure: DILATATION AND CURETTAGE /HYSTEROSCOPY WITH ENDOMETRIAL ABLATION;  Surgeon: Natale Milch, MD;  Location: [facility] ORS;  Service: Gynecology;  Laterality: N/A;  . KNEE SURGERY Bilateral   . TUBAL LIGATION  1997    Prior to Admission medications   Medication Sig Start Date End Date Taking? Authorizing Provider  ferrous sulfate 325 (65 FE) MG tablet Take 325 mg by mouth daily with breakfast.   Yes [provider]  FLUoxetine (PROZAC) 10 MG capsule Take 10 mg by mouth daily. 07/16/19  Yes [provider]  hydrochlorothiazide (HYDRODIURIL) 25 MG tablet Take 25 mg by mouth daily.   Yes [provider]  losartan (COZAAR) 100 MG tablet Take 100 mg by mouth daily.   Yes [provider]  metoprolol succinate (TOPROL-XL) 100 MG 24 hr tablet Take 100 mg by mouth daily. Take with or immediately following a meal.   Yes [provider]  Multiple Vitamin (MULTIVITAMIN WITH MINERALS) TABS tablet Take 1 tablet by mouth daily. 05/20/16  Yes Gouru, Deanna Artis, MD  vitamin B-12 (CYANOCOBALAMIN) 1000 MCG tablet Take 1 tablet (1,000 mcg total) by mouth daily. 10/15/18  Yes Rickard Patience, MD  Vitamin D, Ergocalciferol, (DRISDOL) 1.25 MG (50000 UNIT) CAPS capsule Take 50,000 Units by mouth once a week. 07/17/19  Yes [provider]  acetaminophen (TYLENOL) 500 MG tablet Take 1 tablet (500 mg total) by  mouth every 6 (six) hours as needed. 07/10/19   Merrilee Jansky, MD  meclizine (ANTIVERT) 25 MG tablet Take 25 mg by mouth 3 (three) times daily as needed. Patient not taking: Reported on 08/18/2019 07/03/19   [provider]  tranexamic acid (LYSTEDA) 650 MG TABS tablet Take 2 tablets (1,300 mg total) by mouth 3 (three) times daily. Take during menses for a maximum of five  days Patient not taking: Reported on 08/18/2019 07/26/18   Natale Milch, MD     Allergies Patient has no known allergies.  Family History  Problem Relation Age of Onset  . Hypertension Father   . Pancreatic cancer Father   . CAD Mother   . Diabetes Mother   . Cancer Paternal Grandmother        unknown  . Cancer Paternal Grandfather        unknown  . Breast cancer Neg Hx     Social History Social History   Tobacco Use  . Smoking status: Never Smoker  . Smokeless tobacco: Never Used  Vaping Use  . Vaping Use: Never used  Substance Use Topics  . Alcohol use: Yes    Alcohol/week: 1.0 standard drink    Types: 1 Cans of beer per week  . Drug use: No    Review of Systems  Constitutional: No fever/chills Eyes: No visual changes.  ENT: No sore throat. Cardiovascular: As above Respiratory: Denies shortness of breath. Gastrointestinal: No abdominal pain.  Genitourinary: Negative for dysuria. Musculoskeletal: Negative for back pain. Skin: Negative for rash. Neurological: Negative for headaches or weakness   ____________________________________________   PHYSICAL EXAM:  VITAL SIGNS: ED Triage Vitals  Enc Vitals Group     BP --      Pulse Rate 08/18/19 1537 (!) 166     Resp 08/18/19 1537 (!) 24     Temp 08/18/19 1537 98 F (36.7 C)     Temp Source 08/18/19 1537 Oral     SpO2 08/18/19 1537 96 %     Weight 08/18/19 1540 99.8 kg (220 lb 0.3 oz)     Height 08/18/19 1540 1.626 m (5\' 4" )     Head Circumference --      Peak Flow --      Pain Score 08/18/19 1539 3     Pain Loc --      Pain Edu? --      Excl. in GC? --     Constitutional: Alert and oriented.  Nose: No congestion/rhinnorhea. Mouth/Throat: Mucous membranes are moist.   Neck:  Painless ROM Cardiovascular: Tachycardia, regular rhythm. Grossly normal heart sounds.  Good peripheral circulation. Respiratory: Normal respiratory effort.  No retractions. Lungs CTAB. Gastrointestinal: Soft and  nontender. No distention.   Musculoskeletal: No lower extremity tenderness nor edema.  Warm and well perfused Neurologic:  Normal speech and language. No gross focal neurologic deficits are appreciated.  Skin:  Skin is warm, dry and intact. No rash noted. Psychiatric: Mood and affect are normal. Speech and behavior are normal.  ____________________________________________   LABS (all labs ordered are listed, but only abnormal results are displayed)  Labs Reviewed  BASIC METABOLIC PANEL - Abnormal; Notable for the following components:      Result Value   Glucose, Bld 114 (*)    Creatinine, Ser 1.01 (*)    Calcium 8.4 (*)    All other components within normal limits  CBC - Abnormal; Notable for the following components:   Hemoglobin 8.8 (*)  HCT 31.4 (*)    MCV 72.5 (*)    MCH 20.3 (*)    MCHC 28.0 (*)    RDW 19.0 (*)    nRBC 1.2 (*)    All other components within normal limits  TROPONIN I (HIGH SENSITIVITY) - Abnormal; Notable for the following components:   Troponin I (High Sensitivity) 42 (*)    All other components within normal limits  SARS CORONAVIRUS 2 BY RT PCR (HOSPITAL ORDER, PERFORMED IN [facility])  HIV ANTIBODY (ROUTINE TESTING W REFLEX)  TSH  T4, FREE  HEMOGLOBIN A1C  BRAIN NATRIURETIC PEPTIDE  LIPID PANEL  CBC  BASIC METABOLIC PANEL  IRON AND TIBC  FERRITIN  POC URINE PREG, ED  TROPONIN I (HIGH SENSITIVITY)   ____________________________________________  EKG  ED ECG REPORT I, Jene Every, the attending physician, personally viewed and interpreted this ECG.  Date: 08/18/2019  Rhythm: Atrial fibrillation QRS Axis: normal Intervals: Abnormal ST/T Wave abnormalities: normal Narrative Interpretation: Atrial fibrillation with RVR  ____________________________________________  RADIOLOGY  Chest x-ray reviewed by me, no infiltrate or effusion ____________________________________________   PROCEDURES  Procedure(s) performed:  yes  .1-3 Lead EKG Interpretation Performed by: Jene Every, MD Authorized by: Jene Every, MD     Interpretation: abnormal     ECG rate assessment: tachycardic     Rhythm: atrial fibrillation     Ectopy: none     Conduction: normal       Critical Care performed: yes  CRITICAL CARE Performed by: Jene Every   Total critical care time: 30 minutes  Critical care time was exclusive of separately billable procedures and treating other patients.  Critical care was necessary to treat or prevent imminent or life-threatening deterioration.  Critical care was time spent personally by me on the following activities: development of treatment plan with patient and/or surrogate as well as nursing, discussions with consultants, evaluation of patient's response to treatment, examination of patient, obtaining history from patient or surrogate, ordering and performing treatments and interventions, ordering and review of laboratory studies, ordering and review of radiographic studies, pulse oximetry and re-evaluation of patient's condition.  ____________________________________________   INITIAL IMPRESSION / ASSESSMENT AND PLAN / ED COURSE  Pertinent labs & imaging results that were available during my care of the patient were reviewed by me and considered in my medical decision making (see chart for details).  Patient presents with near syncopal episode, palpitations.  EKG is consistent with atrial fibrillation with rapid ventricular response.  She has no history of this diagnosis, has been having palpitations over the last couple of months, has worn Holter monitor without abnormality per medical records.  Possibility of paroxysmal atrial fibrillation  Heart rate in the 160s here.  Will give IV Cardizem bolus and infusion  Lab work thus far is reassuring.  Mild anemia noted, patient reports normal stools.  No history of blood thinners.  I discussed with the hospitalist for  admission    ____________________________________________   FINAL CLINICAL IMPRESSION(S) / ED DIAGNOSES  Final diagnoses:  Atrial fibrillation with RVR (HCC)  Chest pain, unspecified type  Essential hypertension        Note:  This document was prepared using Dragon voice recognition software and may include unintentional dictation errors.   Jene Every, MD 08/18/19 [phone]

## 2019-08-18 NOTE — ED Notes (Signed)
Meds due; waiting for verification by pharmacy

## 2019-08-18 NOTE — ED Notes (Signed)
Jon Billings NP notified, critical value troponin of 1002.  Heparin orders will follow.

## 2019-08-18 NOTE — ED Triage Notes (Addendum)
Pt arrives from [facility] via [facility] with complaint of chest pain and dizziness and felt she might faint while at work.  CP currently 3/10, dizziness with walking, exertion.  Arrives with HR 172, afib from EMS.  States heart is racing.  Pt is on 100 mg labetolol for HTN which she did not take today, has seen cardiologist for near syncope for the past 2 months, wore holter monitor which did not reveal anything of significance.    Pt is alert and oriented, ambulatory to transfer between stretchers.

## 2019-08-18 NOTE — H&P (Addendum)
History and Physical    Madison Lewis BCW:[id number] DOB: 1970-02-15 DOA: 08/18/2019  PCP: [facility]   Patient coming from: Home  I have personally briefly reviewed patient's old medical records in [facility]  Chief Complaint: Chest discomfort, dizziness and palpitations.  HPI: Madison Lewis is a 49 y.o. female with medical history significant of hypertension, iron deficiency anemia and OSA came to ED with complaint of sudden onset chest discomfort, dizziness and palpitations while at work. Patient sees her cardiologist recently for similar symptoms and presyncopal episodes, was given a Holter monitor for 3 days which was negative for any abnormalities.  Today at work she felt sudden onset of rotations associated with chest discomfort and some nausea.  Denies any shortness of breath.  Chest comfort was more like substernal chest pressure, nonradiating and resolved at the time of interview.  Experiencing similar episodes for the past few month when she felt some palpitations and become dizzy.  Her prior episodes were lasting only for few minutes.  She did saw her primary care physician for those symptoms and was referred to see a cardiologist.  Saw cardiologist twice.  She was started on metoprolol.  Patient denies any recent upper respiratory symptoms, no fever or chills, no orthopnea or PND.  No nausea, vomiting or diarrhea.  No change in her appetite or bowel habits.  No urinary symptoms.  Patient is vaccinated and denies any sick contacts.  ED Course: Hemodynamically stable, EKG with heart rate in 180's and new onset A. Fib. Hemoglobin of 8.8 and creatinine mildly elevated at 1.01, troponin positive at 42. CXR without any acute abnormality. COVID-19 test pending. She was given a bolus of Cardizem and started on Cardizem infusion. Cardiology was also consulted from ED and [facility].  Review of Systems: As per HPI otherwise 10 point review of systems  negative.   Past Medical History:  Diagnosis Date  . Blood transfusion without reported diagnosis   . Headache   . Hypertension   . Iron deficiency anemia due to chronic blood loss 07/14/2018  . Sleep apnea    CPAP ordered but can't tolerate it    Past Surgical History:  Procedure Laterality Date  . CESAREAN SECTION    . COLONOSCOPY WITH PROPOFOL N/A 05/19/2016   Procedure: COLONOSCOPY WITH PROPOFOL;  Surgeon: Midge Minium, MD;  Location: [facility] ENDOSCOPY;  Service: Endoscopy;  Laterality: N/A;  . ESOPHAGOGASTRODUODENOSCOPY (EGD) WITH PROPOFOL N/A 05/19/2016   Procedure: ESOPHAGOGASTRODUODENOSCOPY (EGD) WITH PROPOFOL;  Surgeon: Midge Minium, MD;  Location: [facility] ENDOSCOPY;  Service: Endoscopy;  Laterality: N/A;  . HYSTEROSCOPY WITH D & C N/A 08/13/2018   Procedure: DILATATION AND CURETTAGE /HYSTEROSCOPY WITH ENDOMETRIAL ABLATION;  Surgeon: Natale Milch, MD;  Location: [facility] ORS;  Service: Gynecology;  Laterality: N/A;  . KNEE SURGERY Bilateral   . TUBAL LIGATION  1997     reports that she has never smoked. She has never used smokeless tobacco. She reports current alcohol use of about 1.0 standard drink of alcohol per week. She reports that she does not use drugs.  No Known Allergies  Family History  Problem Relation Age of Onset  . Hypertension Father   . Pancreatic cancer Father   . CAD Mother   . Diabetes Mother   . Cancer Paternal Grandmother        unknown  . Cancer Paternal Grandfather        unknown  . Breast cancer Neg Hx     Prior to  Admission medications   Medication Sig Start Date End Date Taking? Authorizing Provider  acetaminophen (TYLENOL) 500 MG tablet Take 1 tablet (500 mg total) by mouth every 6 (six) hours as needed. 07/10/19   LampteyBritta Mccreedy, MD  ferrous sulfate 325 (65 FE) MG tablet Take 325 mg by mouth daily with breakfast.    [provider]  hydrochlorothiazide (HYDRODIURIL) 25 MG tablet Take 1 tablet (25 mg total) by mouth daily for 30  days. 06/16/18 08/18/19  Ihor Austin, MD  hydrochlorothiazide (HYDRODIURIL) 25 MG tablet Take 25 mg by mouth daily.    [provider]  losartan (COZAAR) 100 MG tablet Take 1 tablet (100 mg total) by mouth daily for 30 days. 06/16/18 08/18/19  Ihor Austin, MD  losartan (COZAAR) 100 MG tablet Take 100 mg by mouth daily.    [provider]  metoprolol succinate (TOPROL-XL) 100 MG 24 hr tablet Take 1 tablet (100 mg total) by mouth daily for 30 days. Take with or immediately following a meal. 06/16/18 08/18/19  Ihor Austin, MD  metoprolol succinate (TOPROL-XL) 100 MG 24 hr tablet Take 100 mg by mouth daily. Take with or immediately following a meal.    [provider]  Multiple Vitamin (MULTIVITAMIN WITH MINERALS) TABS tablet Take 1 tablet by mouth daily. 05/20/16   Gouru, Deanna Artis, MD  predniSONE (STERAPRED UNI-PAK 21 TAB) 10 MG (21) TBPK tablet Take by mouth daily. Take 6 tabs by mouth daily  for 2 days, then 5 tabs for 2 days, then 4 tabs for 2 days, then 3 tabs for 2 days, 2 tabs for 2 days, then 1 tab by mouth daily for 2 days 07/10/19   Merrilee Jansky, MD  tranexamic acid (LYSTEDA) 650 MG TABS tablet Take 2 tablets (1,300 mg total) by mouth 3 (three) times daily. Take during menses for a maximum of five days 07/26/18   Natale Milch, MD  vitamin B-12 (CYANOCOBALAMIN) 1000 MCG tablet Take 1 tablet (1,000 mcg total) by mouth daily. 10/15/18   Rickard Patience, MD    Physical Exam: Vitals:   08/18/19 1537 08/18/19 1540 08/18/19 1603 08/18/19 1720  BP:   115/79 (!) 128/104  Pulse: (!) 166   (!) 113  Resp: (!) 24   18  Temp: 98 F (36.7 C)     TempSrc: Oral     SpO2: 96%   98%  Weight:  99.8 kg    Height:  5\' 4"  (1.626 m)      General: Vital signs reviewed.  Patient is well-developed and well-nourished, in no acute distress and cooperative with exam.  Head: Normocephalic and atraumatic. Eyes: EOMI, conjunctivae normal, no scleral icterus.  ENMT: Mucous membranes are  moist.  Neck: Supple, trachea midline, normal ROM, no JVD, masses, thyromegaly, or carotid bruit present.  Cardiovascular: Irregularly irregular, tachycardia. Pulmonary/Chest: Clear to auscultation bilaterally, no wheezes, rales, or rhonchi. Abdominal: Soft, non-tender, non-distended, BS +,  Extremities: No lower extremity edema bilaterally,  pulses symmetric and intact bilaterally. No cyanosis or clubbing. Neurological: A&O x3, Strength is normal and symmetric bilaterally, cranial nerve II-XII are grossly intact, no focal motor deficit, sensory intact to light touch bilaterally.  Skin: Warm, dry and intact. No rashes or erythema. Psychiatric: Normal mood and affect. speech and behavior is normal. Cognition and memory are normal.   Labs on Admission: I have personally reviewed following labs and imaging studies  CBC: Recent Labs  Lab 08/18/19 1543  WBC 9.0  HGB 8.8*  HCT 31.4*  MCV 72.5*  PLT 295   Basic Metabolic Panel: Recent Labs  Lab 08/18/19 1543  NA 135  K 3.9  CL 106  CO2 22  GLUCOSE 114*  BUN 13  CREATININE 1.01*  CALCIUM 8.4*   GFR: Estimated Creatinine Clearance: 76.5 mL/min (A) (by C-G formula based on SCr of 1.01 mg/dL (H)). Liver Function Tests: No results for input(s): AST, ALT, ALKPHOS, BILITOT, PROT, ALBUMIN in the last 168 hours. No results for input(s): LIPASE, AMYLASE in the last 168 hours. No results for input(s): AMMONIA in the last 168 hours. Coagulation Profile: No results for input(s): INR, PROTIME in the last 168 hours. Cardiac Enzymes: No results for input(s): CKTOTAL, CKMB, CKMBINDEX, TROPONINI in the last 168 hours. BNP (last 3 results) No results for input(s): PROBNP in the last 8760 hours. HbA1C: No results for input(s): HGBA1C in the last 72 hours. CBG: No results for input(s): GLUCAP in the last 168 hours. Lipid Profile: No results for input(s): CHOL, HDL, LDLCALC, TRIG, CHOLHDL, LDLDIRECT in the last 72 hours. Thyroid Function  Tests: No results for input(s): TSH, T4TOTAL, FREET4, T3FREE, THYROIDAB in the last 72 hours. Anemia Panel: No results for input(s): VITAMINB12, FOLATE, FERRITIN, TIBC, IRON, RETICCTPCT in the last 72 hours. Urine analysis:    Component Value Date/Time   COLORURINE STRAW (A) 05/17/2016 1924   APPEARANCEUR CLEAR (A) 05/17/2016 1924   LABSPEC 1.006 05/17/2016 1924   PHURINE 7.0 05/17/2016 1924   GLUCOSEU NEGATIVE 05/17/2016 1924   HGBUR NEGATIVE 05/17/2016 1924   BILIRUBINUR NEGATIVE 05/17/2016 1924   KETONESUR NEGATIVE 05/17/2016 1924   PROTEINUR NEGATIVE 05/17/2016 1924   NITRITE NEGATIVE 05/17/2016 1924   LEUKOCYTESUR NEGATIVE 05/17/2016 1924    Radiological Exams on Admission: DG Chest Portable 1 View  Result Date: 08/18/2019 CLINICAL DATA:  Chest pain and shortness of breath. EXAM: PORTABLE CHEST 1 VIEW COMPARISON:  June 30, 2019 FINDINGS: There is no evidence of acute infiltrate, pleural effusion or pneumothorax. The heart size and mediastinal contours are within normal limits. There is a moderate-sized hiatal hernia. The visualized skeletal structures are unremarkable. IMPRESSION: 1. No active disease. 2. Moderate-sized hiatal hernia. Electronically Signed   By: Aram Candelahaddeus  Houston M.D.   On: 08/18/2019 16:14    EKG: Independently reviewed. A. fib with RVR, mild ST depression in inferolateral leads. LVH.  Assessment/Plan Active Problems:   Atrial fibrillation with RVR (HCC)   New onset A. fib with RVR. Patient was seen by cardiology twice over the past month and a half with complaint of near syncopal episodes. She was given a Holter monitor 3 days which was negative for any significant arrhythmia.  She was given a bolus of Cardizem and was started on Cardizem infusion.  [place] Vasc score of 2. Cardiology was consulted-we will appreciate their recommendations.  -Continue Cardizem infusion and titrate for heart rate below 100. -Start her on Xarelto. -Continue home dose of  metoprolol. -Check TSH -Check BNP  Elevated troponin.  Most likely secondary to demand due to A. fib with RVR. Did experience some chest discomfort which has been resolved. -Trend troponin. -Check lipid profile.  Hypertension.  Currently normotensive and on Cardizem infusion. -Holding home dose of HCTZ, losartan.  AKI.  Most likely prerenal. -Monitor renal function. -Avoid nephrotoxins  Anxiety/depression. Continue home dose of fluoxetine.  Iron deficiency anemia.  Patient has history of iron deficiency anemia secondary to menorrhagia s/p ablation. -Continue home dose of iron supplement. -Check iron studies.   DVT prophylaxis: Xarelto Code Status: Full  code Family Communication: Daughter was at bedside. Disposition Plan: Home Consults called: Cardiology Admission status: Observation   Arnetha Courser MD [facility]  If 7PM-7AM, please contact night-coverage [email or web address]  08/18/2019, 6:00 PM   This record has been created using Conservation officer, historic buildings. Errors have been sought and corrected,but may not always be located. Such creation errors do not reflect on the standard of care.

## 2019-08-18 NOTE — ED Provider Notes (Signed)
[facility]    CSN: [id number] Arrival date & time: 08/18/19  1418      History   Chief Complaint Chief Complaint  Patient presents with  . Chest Pain    HPI Madison Lewis is a 49 y.o. female.   48 yr obese AA female with hx of HTN,anemia presents to UC with 30 min hx of chest pain, SOB, "heart beating fast" while at rest at work drink water. Reports pain is currently "5/10, was 8/10 at its worst". Pt reports few month hx of syncopal episode in which she was evaluated by Dr. Juliann Pares, wore heart monitor, unaware of results or plan of care by cardiology.  The history is provided by the patient. No language interpreter was used.  Chest Pain Pain location:  Unable to specify Pain quality: pressure   Pain quality comment:  Palpitations Pain radiates to:  Does not radiate Pain severity:  Moderate Onset quality:  Sudden Duration:  30 minutes Timing:  Constant Progression:  Improving Chronicity:  New Relieved by:  Nothing Worsened by:  Nothing Ineffective treatments:  None tried Associated symptoms: palpitations and shortness of breath     Past Medical History:  Diagnosis Date  . Blood transfusion without reported diagnosis   . Headache   . Hypertension   . Iron deficiency anemia due to chronic blood loss 07/14/2018  . Sleep apnea    CPAP ordered but can't tolerate it    Patient Active Problem List   Diagnosis Date Noted  . Chest pain 08/18/2019  . Atrial fibrillation with RVR (HCC) 08/18/2019  . Iron deficiency anemia due to chronic blood loss 07/14/2018  . Anemia 07/06/2018  . Uncontrolled hypertension 06/14/2018  . Iron deficiency anemia   . Hematochezia   . Symptomatic anemia 05/17/2016  . Prediabetes 03/24/2015  . Chronic tension-type headache, not intractable 03/18/2015  . 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 Essential hypertension 03/18/2015    Past Surgical History:  Procedure Laterality Date  . CESAREAN SECTION    . COLONOSCOPY  WITH PROPOFOL N/A 05/19/2016   Procedure: COLONOSCOPY WITH PROPOFOL;  Surgeon: Midge Minium, MD;  Location: [facility] ENDOSCOPY;  Service: Endoscopy;  Laterality: N/A;  . ESOPHAGOGASTRODUODENOSCOPY (EGD) WITH PROPOFOL N/A 05/19/2016   Procedure: ESOPHAGOGASTRODUODENOSCOPY (EGD) WITH PROPOFOL;  Surgeon: Midge Minium, MD;  Location: [facility] ENDOSCOPY;  Service: Endoscopy;  Laterality: N/A;  . HYSTEROSCOPY WITH D & C N/A 08/13/2018   Procedure: DILATATION AND CURETTAGE /HYSTEROSCOPY WITH ENDOMETRIAL ABLATION;  Surgeon: Natale Milch, MD;  Location: [facility] ORS;  Service: Gynecology;  Laterality: N/A;  . KNEE SURGERY Bilateral   . TUBAL LIGATION  1997    OB History    Gravida  4   Para  3   Term  3   Preterm      AB  1   Living  3     SAB  1   TAB      Ectopic      Multiple      Live Births  3        Obstetric Comments  S/p BTL         Home Medications    Prior to Admission medications   Medication Sig Start Date End Date Taking? Authorizing Provider  acetaminophen (TYLENOL) 500 MG tablet Take 1 tablet (500 mg total) by mouth every 6 (six) hours as needed. 07/10/19  Yes Lamptey, Britta Mccreedy, MD  ferrous sulfate 325 (65 FE) MG tablet Take 325 mg by mouth  daily with breakfast.   Yes [provider]  hydrochlorothiazide (HYDRODIURIL) 25 MG tablet Take 25 mg by mouth daily.   Yes [provider]  losartan (COZAAR) 100 MG tablet Take 100 mg by mouth daily.   Yes [provider]  metoprolol succinate (TOPROL-XL) 100 MG 24 hr tablet Take 100 mg by mouth daily. Take with or immediately following a meal.   Yes [provider]  vitamin B-12 (CYANOCOBALAMIN) 1000 MCG tablet Take 1 tablet (1,000 mcg total) by mouth daily. 10/15/18  Yes Rickard Patience, MD  FLUoxetine (PROZAC) 10 MG capsule Take 10 mg by mouth daily. 07/16/19   [provider]  meclizine (ANTIVERT) 25 MG tablet Take 25 mg by mouth 3 (three) times daily as needed. Patient not taking:  Reported on 08/18/2019 07/03/19   [provider]  Multiple Vitamin (MULTIVITAMIN WITH MINERALS) TABS tablet Take 1 tablet by mouth daily. 05/20/16   Ramonita Lab, MD  tranexamic acid (LYSTEDA) 650 MG TABS tablet Take 2 tablets (1,300 mg total) by mouth 3 (three) times daily. Take during menses for a maximum of five days Patient not taking: Reported on 08/18/2019 07/26/18   Natale Milch, MD  Vitamin D, Ergocalciferol, (DRISDOL) 1.25 MG (50000 UNIT) CAPS capsule Take 50,000 Units by mouth once a week. 07/17/19   [provider]    Family History Family History  Problem Relation Age of Onset  . Hypertension Father   . Pancreatic cancer Father   . CAD Mother   . Diabetes Mother   . Cancer Paternal Grandmother        unknown  . Cancer Paternal Grandfather        unknown  . Breast cancer Neg Hx     Social History Social History   Tobacco Use  . Smoking status: Never Smoker  . Smokeless tobacco: Never Used  Vaping Use  . Vaping Use: Never used  Substance Use Topics  . Alcohol use: Yes    Alcohol/week: 1.0 standard drink    Types: 1 Cans of beer per week  . Drug use: No     Allergies   Patient has no known allergies.   Review of Systems Review of Systems  Respiratory: Positive for shortness of breath.   Cardiovascular: Positive for chest pain and palpitations.  All other systems reviewed and are negative.    Physical Exam Triage Vital Signs ED Triage Vitals  Enc Vitals Group     BP 08/18/19 1427 137/90     Pulse Rate 08/18/19 1427 94     Resp 08/18/19 1427 20     Temp 08/18/19 1427 98.1 F (36.7 C)     Temp Source 08/18/19 1427 Oral     SpO2 08/18/19 1427 97 %     Weight 08/18/19 1422 220 lb 0.3 oz (99.8 kg)     Height 08/18/19 1422 5\' 4"  (1.626 m)     Head Circumference --      Peak Flow --      Pain Score 08/18/19 1422 7     Pain Loc --      Pain Edu? --      Excl. in GC? --    No data found.  Updated Vital Signs BP 137/90 (BP  Location: Left Arm)   Pulse 94   Temp 98.1 F (36.7 C) (Oral)   Resp 20   Ht 5\' 4"  (1.626 m)   Wt 220 lb 0.3 oz (99.8 kg)   SpO2 97%  BMI 37.77 kg/m   Visual Acuity Right Eye Distance:   Left Eye Distance:   Bilateral Distance:    Right Eye Near:   Left Eye Near:    Bilateral Near:     Physical Exam Vitals and nursing note reviewed.  Constitutional:      Appearance: She is overweight.     Comments: Limited physical  Cardiovascular:     Rate and Rhythm: Tachycardia present. Rhythm irregular.  Pulmonary:     Effort: Pulmonary effort is normal.     Breath sounds: Normal breath sounds.  Neurological:     Mental Status: She is alert.  Psychiatric:        Attention and Perception: Attention normal.        Mood and Affect: Mood normal.        Speech: Speech normal.        Behavior: Behavior normal. Behavior is cooperative.        Thought Content: Thought content normal.      UC Treatments / Results  Labs (all labs ordered are listed, but only abnormal results are displayed) Labs Reviewed - No data to display  EKG   Radiology DG Chest Portable 1 View  Result Date: 08/18/2019 CLINICAL DATA:  Chest pain and shortness of breath. EXAM: PORTABLE CHEST 1 VIEW COMPARISON:  June 30, 2019 FINDINGS: There is no evidence of acute infiltrate, pleural effusion or pneumothorax. The heart size and mediastinal contours are within normal limits. There is a moderate-sized hiatal hernia. The visualized skeletal structures are unremarkable. IMPRESSION: 1. No active disease. 2. Moderate-sized hiatal hernia. Electronically Signed   By: Aram Candela M.D.   On: 08/18/2019 16:14    Procedures Procedures (including critical care time)  Medications Ordered in UC Medications  aspirin chewable tablet 324 mg (324 mg Oral Given 08/18/19 1505)    Initial Impression / Assessment and Plan / UC Course  I have reviewed the triage vital signs and the nursing notes.  Pertinent labs & imaging  results that were available during my care of the patient were reviewed by me and considered in my medical decision making (see chart for details).     EKG shows A Fib w RVR rate 150-180's, pt remained on cardiac monitor until EMS arrived, pt received 4 baby ASA in UC, EMS called for transport to ER for further evaluation and treatment. Report given to [facility], [place], at bedside. Final Clinical Impressions(s) / UC Diagnoses   Final diagnoses:  Chest pain, unspecified type  Atrial fibrillation with RVR [facility])     Discharge Instructions     TO ER via EMS for further evaluation and treatment.    ED Prescriptions    None     PDMP not reviewed this encounter.   Clancy Gourd, NP 08/18/19 1906

## 2019-08-18 NOTE — ED Triage Notes (Signed)
Pt c/o middle chest pain, shortness of breath, sweats, nausea and headache. Started about 30 minutes ago. She has family h/o heart disease but pt has never had heart problems.

## 2019-08-19 ENCOUNTER — Observation Stay
Admit: 2019-08-19 | Discharge: 2019-08-19 | Disposition: A | Discharge status: Home / Self Care | Payer: BC Managed Care – PPO | Attending: Cardiology | Admitting: Cardiology

## 2019-08-19 DIAGNOSIS — E669 Obesity, unspecified: Secondary | ICD-10-CM | POA: Diagnosis present

## 2019-08-19 DIAGNOSIS — I4891 Unspecified atrial fibrillation: Secondary | ICD-10-CM | POA: Diagnosis present

## 2019-08-19 DIAGNOSIS — E789 Disorder of lipoprotein metabolism, unspecified: Secondary | ICD-10-CM | POA: Diagnosis present

## 2019-08-19 DIAGNOSIS — I480 Paroxysmal atrial fibrillation: Secondary | ICD-10-CM

## 2019-08-19 DIAGNOSIS — N920 Excessive and frequent menstruation with regular cycle: Secondary | ICD-10-CM | POA: Diagnosis present

## 2019-08-19 DIAGNOSIS — D500 Iron deficiency anemia secondary to blood loss (chronic): Secondary | ICD-10-CM

## 2019-08-19 DIAGNOSIS — Z6837 Body mass index (BMI) 37.0-37.9, adult: Secondary | ICD-10-CM | POA: Diagnosis not present

## 2019-08-19 DIAGNOSIS — F329 Major depressive disorder, single episode, unspecified: Secondary | ICD-10-CM | POA: Diagnosis present

## 2019-08-19 DIAGNOSIS — R0789 Other chest pain: Secondary | ICD-10-CM | POA: Diagnosis present

## 2019-08-19 DIAGNOSIS — Z79899 Other long term (current) drug therapy: Secondary | ICD-10-CM | POA: Diagnosis not present

## 2019-08-19 DIAGNOSIS — I10 Essential (primary) hypertension: Secondary | ICD-10-CM | POA: Diagnosis present

## 2019-08-19 DIAGNOSIS — R7303 Prediabetes: Secondary | ICD-10-CM | POA: Diagnosis present

## 2019-08-19 DIAGNOSIS — G4733 Obstructive sleep apnea (adult) (pediatric): Secondary | ICD-10-CM | POA: Diagnosis present

## 2019-08-19 DIAGNOSIS — Z20822 Contact with and (suspected) exposure to covid-19: Secondary | ICD-10-CM | POA: Diagnosis present

## 2019-08-19 DIAGNOSIS — Z8249 Family history of ischemic heart disease and other diseases of the circulatory system: Secondary | ICD-10-CM | POA: Diagnosis not present

## 2019-08-19 DIAGNOSIS — N179 Acute kidney failure, unspecified: Secondary | ICD-10-CM | POA: Diagnosis present

## 2019-08-19 DIAGNOSIS — F419 Anxiety disorder, unspecified: Secondary | ICD-10-CM | POA: Diagnosis present

## 2019-08-19 DIAGNOSIS — I248 Other forms of acute ischemic heart disease: Secondary | ICD-10-CM | POA: Diagnosis present

## 2019-08-19 LAB — HEPARIN LEVEL (UNFRACTIONATED)
Heparin Unfractionated: 1.34 IU/mL — ABNORMAL HIGH (ref 0.30–0.70)
Heparin Unfractionated: >3.6 IU/mL — ABNORMAL HIGH (ref 0.30–0.70)

## 2019-08-19 LAB — CBC
HCT: 29.4 % — ABNORMAL LOW (ref 36.0–46.0)
Hemoglobin: 8.5 g/dL — ABNORMAL LOW (ref 12.0–15.0)
MCH: 20.7 pg — ABNORMAL LOW (ref 26.0–34.0)
MCHC: 28.9 g/dL — ABNORMAL LOW (ref 30.0–36.0)
MCV: 71.5 fL — ABNORMAL LOW (ref 80.0–100.0)
Platelets: 326 10*3/uL (ref 150–400)
RBC: 4.11 MIL/uL (ref 3.87–5.11)
RDW: 18.8 % — ABNORMAL HIGH (ref 11.5–15.5)
WBC: 9 10*3/uL (ref 4.0–10.5)
nRBC: 1.6 % — ABNORMAL HIGH (ref 0.0–0.2)

## 2019-08-19 LAB — BASIC METABOLIC PANEL
Anion gap: 7 (ref 5–15)
BUN: 13 mg/dL (ref 6–20)
CO2: 25 mmol/L (ref 22–32)
Calcium: 8.9 mg/dL (ref 8.9–10.3)
Chloride: 102 mmol/L (ref 98–111)
Creatinine, Ser: 0.96 mg/dL (ref 0.44–1.00)
GFR calc Af Amer: >60 mL/min (ref >60)
GFR calc non Af Amer: >60 mL/min (ref >60)
Glucose, Bld: 139 mg/dL — ABNORMAL HIGH (ref 70–99)
Potassium: 3.7 mmol/L (ref 3.5–5.1)
Sodium: 134 mmol/L — ABNORMAL LOW (ref 135–145)

## 2019-08-19 LAB — LIPID PANEL
Cholesterol: 212 mg/dL — ABNORMAL HIGH (ref 0–200)
HDL: 52 mg/dL (ref >40)
LDL Cholesterol: 119 mg/dL — ABNORMAL HIGH (ref 0–99)
Total CHOL/HDL Ratio: 4.1 RATIO
Triglycerides: 204 mg/dL — ABNORMAL HIGH (ref <150)
VLDL: 41 mg/dL — ABNORMAL HIGH (ref 0–40)

## 2019-08-19 LAB — TROPONIN I (HIGH SENSITIVITY): Troponin I (High Sensitivity): 1144 ng/L (ref <18)

## 2019-08-19 LAB — ECHOCARDIOGRAM COMPLETE
AR max vel: 2.56 cm2
AV Area VTI: 2.5 cm2
AV Area mean vel: 2.67 cm2
AV Mean grad: 6 mmHg
AV Peak grad: 10.5 mmHg
Ao pk vel: 1.62 m/s
Area-P 1/2: 3.06 cm2
Height: 64 in
S' Lateral: 2.28 cm
Weight: 3520.31 oz

## 2019-08-19 LAB — HEMOGLOBIN A1C
Hgb A1c MFr Bld: 6.2 % — ABNORMAL HIGH (ref 4.8–5.6)
Mean Plasma Glucose: 131.24 mg/dL

## 2019-08-19 LAB — APTT
aPTT: 29 seconds (ref 24–36)
aPTT: 83 seconds — ABNORMAL HIGH (ref 24–36)
aPTT: 85 seconds — ABNORMAL HIGH (ref 24–36)

## 2019-08-19 LAB — HIV ANTIBODY (ROUTINE TESTING W REFLEX): HIV Screen 4th Generation wRfx: NONREACTIVE

## 2019-08-19 LAB — PROTIME-INR
INR: 1.2 (ref 0.8–1.2)
Prothrombin Time: 14.6 seconds (ref 11.4–15.2)

## 2019-08-19 MED ORDER — HEPARIN (PORCINE) 25000 UT/250ML-% IV SOLN
1050.0000 [IU]/h | INTRAVENOUS | Status: DC
  Administered 2019-08-19 – 2019-08-20 (×2): 900 [IU]/h via INTRAVENOUS
  Filled 2019-08-19 (×2): qty 250

## 2019-08-19 MED ORDER — DILTIAZEM HCL 30 MG PO TABS
60.0000 mg | ORAL_TABLET | Freq: Four times a day (QID) | ORAL | Status: DC
  Administered 2019-08-19 – 2019-08-20 (×6): 60 mg via ORAL
  Filled 2019-08-19 (×3): qty 2
  Filled 2019-08-19: qty 1
  Filled 2019-08-19 (×2): qty 2

## 2019-08-19 MED ORDER — ASPIRIN EC 81 MG PO TBEC
81.0000 mg | DELAYED_RELEASE_TABLET | Freq: Every day | ORAL | Status: DC
  Administered 2019-08-19 – 2019-08-20 (×2): 81 mg via ORAL
  Filled 2019-08-19 (×2): qty 1

## 2019-08-19 MED ORDER — ATORVASTATIN CALCIUM 20 MG PO TABS
40.0000 mg | ORAL_TABLET | Freq: Every day | ORAL | Status: DC
  Administered 2019-08-19 – 2019-08-20 (×2): 40 mg via ORAL
  Filled 2019-08-19 (×2): qty 2

## 2019-08-19 MED ORDER — TRAMADOL HCL 50 MG PO TABS
50.0000 mg | ORAL_TABLET | Freq: Four times a day (QID) | ORAL | Status: DC | PRN
  Administered 2019-08-19 – 2019-08-20 (×2): 50 mg via ORAL
  Filled 2019-08-19 (×2): qty 1

## 2019-08-19 NOTE — Consult Note (Signed)
Cardiology Consultation Note    Patient ID: Madison Lewis, MRN: [id number], DOB/AGE: 1970/11/19 49 y.o. Admit date: 08/18/2019   Date of Consult: 08/19/2019 Primary Physician: [facility] Primary Cardiologist: Dr. Juliann Pares  Chief Complaint: afib Reason for Consultation: afib Requesting MD: Dr. Mayford Knife  HPI: Madison Lewis is a 49 y.o. female with history of hypertension, anemia, obstructive sleep apnea who came to the ER yesterday afternoon with complaints of chest discomfort dizziness and palpitations.  She has had palpitations and presyncope in the past with a Holter monitor showing no significant arrhythmias.  On day of presentation she felt palpitations rapid heart rate chest pressure and dizziness.  In the emergency room she was in atrial fibrillation with rapid ventricular response with ventricular rates in the 180s.  Hemoglobin was 8.8 with a troponin of 42.  She was given a bolus of IV Cardizem and placed on a Cardizem drip.  Troponin has peaked at 1144.   Past Medical History:  Diagnosis Date  . Blood transfusion without reported diagnosis   . Headache   . Hypertension   . Iron deficiency anemia due to chronic blood loss 07/14/2018  . Sleep apnea    CPAP ordered but can't tolerate it      Surgical History:  Past Surgical History:  Procedure Laterality Date  . CESAREAN SECTION    . COLONOSCOPY WITH PROPOFOL N/A 05/19/2016   Procedure: COLONOSCOPY WITH PROPOFOL;  Surgeon: Midge Minium, MD;  Location: [facility] ENDOSCOPY;  Service: Endoscopy;  Laterality: N/A;  . ESOPHAGOGASTRODUODENOSCOPY (EGD) WITH PROPOFOL N/A 05/19/2016   Procedure: ESOPHAGOGASTRODUODENOSCOPY (EGD) WITH PROPOFOL;  Surgeon: Midge Minium, MD;  Location: [facility] ENDOSCOPY;  Service: Endoscopy;  Laterality: N/A;  . HYSTEROSCOPY WITH D & C N/A 08/13/2018   Procedure: DILATATION AND CURETTAGE /HYSTEROSCOPY WITH ENDOMETRIAL ABLATION;  Surgeon: Natale Milch, MD;  Location: [facility] ORS;  Service:  Gynecology;  Laterality: N/A;  . KNEE SURGERY Bilateral   . TUBAL LIGATION  1997     Home Meds: Prior to Admission medications   Medication Sig Start Date End Date Taking? Authorizing Provider  ferrous sulfate 325 (65 FE) MG tablet Take 325 mg by mouth daily with breakfast.   Yes [provider]  FLUoxetine (PROZAC) 10 MG capsule Take 10 mg by mouth daily. 07/16/19  Yes [provider]  hydrochlorothiazide (HYDRODIURIL) 25 MG tablet Take 25 mg by mouth daily.   Yes [provider]  losartan (COZAAR) 100 MG tablet Take 100 mg by mouth daily.   Yes [provider]  metoprolol succinate (TOPROL-XL) 100 MG 24 hr tablet Take 100 mg by mouth daily. Take with or immediately following a meal.   Yes [provider]  Multiple Vitamin (MULTIVITAMIN WITH MINERALS) TABS tablet Take 1 tablet by mouth daily. 05/20/16  Yes Gouru, Deanna Artis, MD  vitamin B-12 (CYANOCOBALAMIN) 1000 MCG tablet Take 1 tablet (1,000 mcg total) by mouth daily. 10/15/18  Yes Rickard Patience, MD  Vitamin D, Ergocalciferol, (DRISDOL) 1.25 MG (50000 UNIT) CAPS capsule Take 50,000 Units by mouth once a week. 07/17/19  Yes [provider]  acetaminophen (TYLENOL) 500 MG tablet Take 1 tablet (500 mg total) by mouth every 6 (six) hours as needed. 07/10/19   Merrilee Jansky, MD  meclizine (ANTIVERT) 25 MG tablet Take 25 mg by mouth 3 (three) times daily as needed. Patient not taking: Reported on 08/18/2019 07/03/19   [provider]  tranexamic acid (LYSTEDA) 650 MG TABS tablet Take 2 tablets (1,300 mg  total) by mouth 3 (three) times daily. Take during menses for a maximum of five days Patient not taking: Reported on 08/18/2019 07/26/18   Natale Milch, MD    Inpatient Medications:  . ferrous sulfate  325 mg Oral Q breakfast  . FLUoxetine  10 mg Oral Daily  . metoprolol succinate  100 mg Oral Daily   . diltiazem (CARDIZEM) infusion 15 mg/hr (08/18/19 1745)  . heparin 900 Units/hr  (08/19/19 0102)    Allergies: No Known Allergies  Social History   Socioeconomic History  . Marital status: Single    Spouse name: Not on file  . Number of children: Not on file  . Years of education: Not on file  . Highest education level: Not on file  Occupational History  . Not on file  Tobacco Use  . Smoking status: Never Smoker  . Smokeless tobacco: Never Used  Vaping Use  . Vaping Use: Never used  Substance and Sexual Activity  . Alcohol use: Yes    Alcohol/week: 1.0 standard drink    Types: 1 Cans of beer per week  . Drug use: No  . Sexual activity: Not Currently    Birth control/protection: Surgical    Comment: Tubal ligation   Other Topics Concern  . Not on file  Social History Narrative  . Not on file   Social Determinants of Health   Financial Resource Strain:   . Difficulty of Paying Living Expenses:   Food Insecurity:   . Worried About Programme researcher, broadcasting/film/video in the Last Year:   . Barista in the Last Year:   Transportation Needs:   . Freight forwarder (Medical):   Marland Kitchen Lack of Transportation (Non-Medical):   Physical Activity:   . Days of Exercise per Week:   . Minutes of Exercise per Session:   Stress:   . Feeling of Stress :   Social Connections:   . Frequency of Communication with Friends and Family:   . Frequency of Social Gatherings with Friends and Family:   . Attends Religious Services:   . Active Member of Clubs or Organizations:   . Attends Banker Meetings:   Marland Kitchen Marital Status:   Intimate Partner Violence:   . Fear of Current or Ex-Partner:   . Emotionally Abused:   Marland Kitchen Physically Abused:   . Sexually Abused:      Family History  Problem Relation Age of Onset  . Hypertension Father   . Pancreatic cancer Father   . CAD Mother   . Diabetes Mother   . Cancer Paternal Grandmother        unknown  . Cancer Paternal Grandfather        unknown  . Breast cancer Neg Hx      Review of Systems: A 12-system review  of systems was performed and is negative except as noted in the HPI.  Labs: No results for input(s): CKTOTAL, CKMB, TROPONINI in the last 72 hours. Lab Results  Component Value Date   WBC 9.0 08/19/2019   HGB 8.5 (L) 08/19/2019   HCT 29.4 (L) 08/19/2019   MCV 71.5 (L) 08/19/2019   PLT 326 08/19/2019    Recent Labs  Lab 08/19/19 0721  NA 134*  K 3.7  CL 102  CO2 25  BUN 13  CREATININE 0.96  CALCIUM 8.9  GLUCOSE 139*   Lab Results  Component Value Date   CHOL 212 (H) 08/19/2019   HDL 52 08/19/2019  LDLCALC 119 (H) 08/19/2019   TRIG 204 (H) 08/19/2019   No results found for: DDIMER  Radiology/Studies:  DG Chest Portable 1 View  Result Date: 08/18/2019 CLINICAL DATA:  Chest pain and shortness of breath. EXAM: PORTABLE CHEST 1 VIEW COMPARISON:  June 30, 2019 FINDINGS: There is no evidence of acute infiltrate, pleural effusion or pneumothorax. The heart size and mediastinal contours are within normal limits. There is a moderate-sized hiatal hernia. The visualized skeletal structures are unremarkable. IMPRESSION: 1. No active disease. 2. Moderate-sized hiatal hernia. Electronically Signed   By: Aram Candela M.D.   On: 08/18/2019 16:14    Wt Readings from Last 3 Encounters:  08/18/19 99.8 kg  08/18/19 99.8 kg  07/10/19 99.8 kg    EKG: afib with rvr  Physical Exam:  Blood pressure (!) 153/86, pulse 61, temperature 98 F (36.7 C), temperature source Oral, resp. rate (!) 22, height 5\' 4"  (1.626 m), weight 99.8 kg, SpO2 100 %. Body mass index is 37.77 kg/m. General: Well developed, well nourished, in no acute distress. Head: Normocephalic, atraumatic, sclera non-icteric, no xanthomas, nares are without discharge.  Neck: Negative for carotid bruits. JVD not elevated. Lungs: Clear bilaterally to auscultation without wheezes, rales, or rhonchi. Breathing is unlabored. Heart: irr, irr with rapid heart rate Abdomen: Soft, non-tender, non-distended with normoactive bowel  sounds. No hepatomegaly. No rebound/guarding. No obvious abdominal masses. Msk:  Strength and tone appear normal for age. Extremities: No clubbing or cyanosis. No edema.  Distal pedal pulses are 2+ and equal bilaterally. Neuro: Alert and oriented X 3. No facial asymmetry. No focal deficit. Moves all extremities spontaneously. Psych:  Responds to questions appropriately with a normal affect.     Assessment and Plan  49 y.o. female with history of hypertension, anemia, obstructive sleep apnea who came to the ER yesterday afternoon with complaints of chest discomfort dizziness and palpitations.  She has had palpitations and presyncope in the past with a Holter monitor showing no significant arrhythmias.  On day of presentation she felt palpitations rapid heart rate chest pressure and dizziness.  In the emergency room she was in atrial fibrillation with rapid ventricular response with ventricular rates in the 180s.  Hemoglobin was 8.8 with a troponin of 42.  She was given a bolus of IV Cardizem and placed on a Cardizem drip.  Troponin has peaked at 1144.    afib-apparently new for this patient. Echo shows ef 55-60 with no significant valvular abnormalities. Attempting to control rate with metoprolol 100 daily, and cardizem 60 q 6 po. Will continue with heparin for now. Will need conversion to oral anticoagulation prior to discharge if no invasive evaluaton is required. .   Elevated troponin-most likely demand given rapid rate but will need to evaluated for possible ischeia now that rate is in better control. No wall motion abnormalities noted. Will continue with asa, atorvastatin 40, metoprolol 100 daily. Will evaluate in am regarding further ischemic workup.   Signed, 44 MD 08/19/2019, 8:12 AM Pager: [phone]

## 2019-08-19 NOTE — Progress Notes (Signed)
ANTICOAGULATION CONSULT NOTE - Initial Consult  Pharmacy Consult for Heparin  Indication: chest pain/ACS  No Known Allergies  Patient Measurements: Height: 5\' 4"  (162.6 cm) Weight: 99.8 kg (220 lb 0.3 oz) IBW/kg (Calculated) : 54.7 Heparin Dosing Weight: 77.8 kg  Vital Signs: Temp: 98 F (36.7 C) (08/02 1537) Temp Source: Oral (08/02 1537) BP: 121/79 (08/03 0000) Pulse Rate: 70 (08/03 0000)  Labs: Recent Labs    08/18/19 1543 08/18/19 1552 08/18/19 2210 08/19/19 0028  HGB 8.8*  --   --   --   HCT 31.4*  --   --   --   PLT 295  --   --   --   APTT  --   --   --  29  LABPROT  --   --   --  14.6  INR  --   --   --  1.2  HEPARINUNFRC  --   --   --  1.34*  CREATININE 1.01*  --   --   --   TROPONINIHS  --  42* 1,002* 1,144*    Estimated Creatinine Clearance: 76.5 mL/min (A) (by C-G formula based on SCr of 1.01 mg/dL (H)).   Medical History: Past Medical History:  Diagnosis Date  . Blood transfusion without reported diagnosis   . Headache   . Hypertension   . Iron deficiency anemia due to chronic blood loss 07/14/2018  . Sleep apnea    CPAP ordered but can't tolerate it    Medications:  (Not in a hospital admission)   Assessment: Pharmacy consulted to dose heparin in this 49 year old female admitted with ACS/NSTEMI.   Pt received one dose of Xarelto 20 mg X 1 in ED on 8/2 @ 1910 but was not on any anticoag previously.   Per NP, Cardiac cath being considered for 8/3.  CrCl = 76.5 ml/min   Goal of Therapy:  Heparin level 0.3-0.7 units/ml  APTT = 66 - 102  Monitor platelets by anticoagulation protocol: Yes   Plan:  Will start Heparin gtt @ 900 units/hr , will not bolus this pt due to recent dose of Xarelto.  Will draw aPTT and HL 6 hrs after start of drip. Will use aPTT to guide dosing until HL and aPTT are both therapeutic.   Island Dohmen D 08/19/2019,1:21 AM

## 2019-08-19 NOTE — ED Notes (Signed)
Admitting MD at bedside at this time.

## 2019-08-19 NOTE — ED Notes (Signed)
Admitting MD and cardiology made aware of patient's HR, pt initially ST at 140 while sitting up in bed eating, pt now noted to be in A-fib at a controlled rate of 85-97.

## 2019-08-19 NOTE — Progress Notes (Signed)
ANTICOAGULATION CONSULT NOTE   Pharmacy Consult for Heparin  Indication: chest pain/ACS  No Known Allergies  Patient Measurements: Height: 5\' 4"  (162.6 cm) Weight: 99.8 kg (220 lb 0.3 oz) IBW/kg (Calculated) : 54.7 Heparin Dosing Weight: 77.8 kg  Vital Signs: Temp: 98 F (36.7 C) (08/03 0724) Temp Source: Oral (08/03 0724) BP: 153/86 (08/03 0724) Pulse Rate: 61 (08/03 0301)  Labs: Recent Labs    08/18/19 1543 08/18/19 1552 08/18/19 2210 08/19/19 0028 08/19/19 0721  HGB 8.8*  --   --   --  8.5*  HCT 31.4*  --   --   --  29.4*  PLT 295  --   --   --  326  APTT  --   --   --  29 83*  LABPROT  --   --   --  14.6  --   INR  --   --   --  1.2  --   HEPARINUNFRC  --   --   --  1.34*  --   CREATININE 1.01*  --   --   --  0.96  TROPONINIHS  --  42* 1,002* 1,144*  --     Estimated Creatinine Clearance: 80.5 mL/min (by C-G formula based on SCr of 0.96 mg/dL).   Medical History: Past Medical History:  Diagnosis Date   Blood transfusion without reported diagnosis    Headache    Hypertension    Iron deficiency anemia due to chronic blood loss 07/14/2018   Sleep apnea    CPAP ordered but can't tolerate it    Medications:  (Not in a hospital admission)   Assessment: Pharmacy consulted to dose heparin in this 49 year old female admitted with ACS/NSTEMI.   Pt received one dose of Xarelto 20 mg X 1 in ED on 8/2 @ 1910 but was not on any anticoag previously.   Per NP, Cardiac cath being considered for 8/3.  CrCl = 76.5 ml/min    8/3 Will start Heparin gtt @ 900 units/hr , will not bolus this pt due to recent dose of Xarelto.  Will draw aPTT and HL 6 hrs after start of drip. Will use aPTT to guide dosing until HL and aPTT are both therapeutic.   Goal of Therapy:  Heparin level 0.3-0.7 units/ml  APTT = 66 - 102  Monitor platelets by anticoagulation protocol: Yes   Plan:  8/3 @0721  aPTT=83.therapeutic  Will continue Heparin gtt @ 900 units/hr  Will check  confirmatory aPTT in 6 hrs. Will use aPTT to guide dosing until HL and aPTT are correlating.44 A 08/19/2019,8:19 AM

## 2019-08-19 NOTE — Progress Notes (Signed)
*  PRELIMINARY RESULTS* Echocardiogram 2D Echocardiogram has been performed.  Madison Lewis 08/19/2019, 10:04 AM

## 2019-08-19 NOTE — Progress Notes (Signed)
PROGRESS NOTE    Madison Lewis  EXB:[id number] DOB: [birth year] DOA: 08/18/2019 PCP: [facility]    Assessment & Plan:   Active Problems:   Atrial fibrillation with RVR (HCC)    A. fib: with RVR. New onset. S/p holter monitor 3 days which was negative for any significant arrhythmia. Continue on cardizem, metoprolol.  [place] Vasc score of at least 2, started on xarelto on admission. Continue on tele. Cardio consulted   Elevated troponin: likely secondary to demand ischemia from a. fib w/ RVR  Hypertension: continue on cardizem, metoprolol. Continue to hold home dose of HCTZ, losartan   AKI: resolved  Depression: severity unknown. Continue on home dose of fluoxetine   Iron deficiency anemia: secondary to menorrhagia s/p ablation. Continue on home dose of iron supplement   Pre-DM: HbA1c 6.2. Diet and exercise counseling  Obese: BMI 37.7. Would benefit from weight loss    DVT prophylaxis: xarelto Code Status: full  Family Communication:  Disposition Plan: likely d/c back home   Status is: Inpatient  Remains inpatient appropriate because:Ongoing diagnostic testing needed not appropriate for outpatient work up   Dispo: The patient is from: Home              Anticipated d/c is to: Home              Anticipated d/c date is: 2 days              Patient currently is not medically stable to d/c.    Consultants:   Cardio    Procedures:   Antimicrobials:  Subjective: Pt c/o intermittent palpitations  Objective: Vitals:   08/18/19 2048 08/19/19 0000 08/19/19 0301 08/19/19 0724  BP: (!) 132/93 121/79 114/76 (!) 153/86  Pulse: (!) 159 70 61   Resp: 20 16 10  (!) 22  Temp:    98 F (36.7 C)  TempSrc:    Oral  SpO2: 99% 98% 99% 100%  Weight:      Height:       No intake or output data in the 24 hours ending 08/19/19 0755 Filed Weights   08/18/19 1540  Weight: 99.8 kg    Examination:  General exam: Appears calm and comfortable  Respiratory  system: Clear to auscultation. Respiratory effort normal. Cardiovascular system: irregularly irregular. No rubs, gallops or clicks. B/l LE edema Gastrointestinal system: Abdomen is obese, soft and nontender.  Normal bowel sounds heard. Central nervous system: Alert and oriented. Moves all 4 extremities  Psychiatry: Judgement and insight appear normal. Mood & affect appropriate.     Data Reviewed: I have personally reviewed following labs and imaging studies  CBC: Recent Labs  Lab 08/18/19 1543 08/19/19 0721  WBC 9.0 9.0  HGB 8.8* 8.5*  HCT 31.4* 29.4*  MCV 72.5* 71.5*  PLT 295 326   Basic Metabolic Panel: Recent Labs  Lab 08/18/19 1543  NA 135  K 3.9  CL 106  CO2 22  GLUCOSE 114*  BUN 13  CREATININE 1.01*  CALCIUM 8.4*   GFR: Estimated Creatinine Clearance: 76.5 mL/min (A) (by C-G formula based on SCr of 1.01 mg/dL (H)). Liver Function Tests: No results for input(s): AST, ALT, ALKPHOS, BILITOT, PROT, ALBUMIN in the last 168 hours. No results for input(s): LIPASE, AMYLASE in the last 168 hours. No results for input(s): AMMONIA in the last 168 hours. Coagulation Profile: Recent Labs  Lab 08/19/19 0028  INR 1.2   Cardiac Enzymes: No results for input(s): CKTOTAL, CKMB, CKMBINDEX, TROPONINI in  the last 168 hours. BNP (last 3 results) No results for input(s): PROBNP in the last 8760 hours. HbA1C: Recent Labs    08/18/19 2210  HGBA1C 6.2*   CBG: No results for input(s): GLUCAP in the last 168 hours. Lipid Profile: No results for input(s): CHOL, HDL, LDLCALC, TRIG, CHOLHDL, LDLDIRECT in the last 72 hours. Thyroid Function Tests: Recent Labs    08/18/19 2210  TSH 1.031  FREET4 0.80   Anemia Panel: Recent Labs    08/18/19 2210  FERRITIN 5*  TIBC 420  IRON 16*   Sepsis Labs: No results for input(s): PROCALCITON, LATICACIDVEN in the last 168 hours.  Recent Results (from the past 240 hour(s))  SARS Coronavirus 2 by RT PCR (hospital order, performed  in [facility] hospital lab) Nasopharyngeal Nasopharyngeal Swab     Status: None   Collection Time: 08/18/19  5:47 PM   Specimen: Nasopharyngeal Swab  Result Value Ref Range Status   SARS Coronavirus 2 NEGATIVE NEGATIVE Final    Comment: (NOTE) SARS-CoV-2 target nucleic acids are NOT DETECTED.  The SARS-CoV-2 RNA is generally detectable in upper and lower respiratory specimens during the acute phase of infection. The lowest concentration of SARS-CoV-2 viral copies this assay can detect is 250 copies / mL. A negative result does not preclude SARS-CoV-2 infection and should not be used as the sole basis for treatment or other patient management decisions.  A negative result may occur with improper specimen collection / handling, submission of specimen other than nasopharyngeal swab, presence of viral mutation(s) within the areas targeted by this assay, and inadequate number of viral copies (<250 copies / mL). A negative result must be combined with clinical observations, patient history, and epidemiological information.  Fact Sheet for Patients:   BoilerBrush.com.cy  Fact Sheet for Healthcare Providers: https://pope.com/  This test is not yet approved or  cleared by the [place] FDA and has been authorized for detection and/or diagnosis of SARS-CoV-2 by FDA under an Emergency Use Authorization (EUA).  This EUA will remain in effect (meaning this test can be used) for the duration of the COVID-19 declaration under Section 564(b)(1) of the Act, 21 U.S.C. section 360bbb-3(b)(1), unless the authorization is terminated or revoked sooner.  Performed at [facility], [address], [place], [place] [address]          Radiology Studies: DG Chest Portable 1 View  Result Date: 08/18/2019 CLINICAL DATA:  Chest pain and shortness of breath. EXAM: PORTABLE CHEST 1 VIEW COMPARISON:  June 30, 2019 FINDINGS: There is no  evidence of acute infiltrate, pleural effusion or pneumothorax. The heart size and mediastinal contours are within normal limits. There is a moderate-sized hiatal hernia. The visualized skeletal structures are unremarkable. IMPRESSION: 1. No active disease. 2. Moderate-sized hiatal hernia. Electronically Signed   By: Aram Candela M.D.   On: 08/18/2019 16:14        Scheduled Meds: . ferrous sulfate  325 mg Oral Q breakfast  . FLUoxetine  10 mg Oral Daily  . metoprolol succinate  100 mg Oral Daily   Continuous Infusions: . diltiazem (CARDIZEM) infusion 15 mg/hr (08/18/19 1745)  . heparin 900 Units/hr (08/19/19 0102)     LOS: 0 days    Time spent: 34 mins     Charise Killian, MD [facility] Pager [phone]  If 7PM-7AM, please contact night-coverage [email or web address] 08/19/2019, 7:55 AM

## 2019-08-19 NOTE — Progress Notes (Signed)
ANTICOAGULATION CONSULT NOTE   Pharmacy Consult for Heparin  Indication: chest pain/ACS  No Known Allergies  Patient Measurements: Height: 5\' 4"  (162.6 cm) Weight: 99.8 kg (220 lb 0.3 oz) IBW/kg (Calculated) : 54.7 Heparin Dosing Weight: 77.8 kg  Vital Signs: Temp: 98 F (36.7 C) (08/03 0724) Temp Source: Oral (08/03 0724) BP: 134/81 (08/03 1230) Pulse Rate: 60 (08/03 1230)  Labs: Recent Labs    08/18/19 1543 08/18/19 1552 08/18/19 2210 08/19/19 0028 08/19/19 0721  HGB 8.8*  --   --   --  8.5*  HCT 31.4*  --   --   --  29.4*  PLT 295  --   --   --  326  APTT  --   --   --  29 83*  LABPROT  --   --   --  14.6  --   INR  --   --   --  1.2  --   HEPARINUNFRC  --   --   --  1.34* >3.60*  CREATININE 1.01*  --   --   --  0.96  TROPONINIHS  --  42* 1,002* 1,144*  --     Estimated Creatinine Clearance: 80.5 mL/min (by C-G formula based on SCr of 0.96 mg/dL).   Medical History: Past Medical History:  Diagnosis Date  . Blood transfusion without reported diagnosis   . Headache   . Hypertension   . Iron deficiency anemia due to chronic blood loss 07/14/2018  . Sleep apnea    CPAP ordered but can't tolerate it    Medications:  Medications Prior to Admission  Medication Sig Dispense Refill Last Dose  . ferrous sulfate 325 (65 FE) MG tablet Take 325 mg by mouth daily with breakfast.   08/17/2019 at 0900  . FLUoxetine (PROZAC) 10 MG capsule Take 10 mg by mouth daily.   08/17/2019 at 0900  . hydrochlorothiazide (HYDRODIURIL) 25 MG tablet Take 25 mg by mouth daily.   08/17/2019 at 0900  . losartan (COZAAR) 100 MG tablet Take 100 mg by mouth daily.   08/17/2019 at 0900  . metoprolol succinate (TOPROL-XL) 100 MG 24 hr tablet Take 100 mg by mouth daily. Take with or immediately following a meal.   08/17/2019 at 0900  . Multiple Vitamin (MULTIVITAMIN WITH MINERALS) TABS tablet Take 1 tablet by mouth daily.   08/17/2019 at 0900  . vitamin B-12 (CYANOCOBALAMIN) 1000 MCG tablet Take 1 tablet  (1,000 mcg total) by mouth daily. 90 tablet 3 08/17/2019 at 0900  . Vitamin D, Ergocalciferol, (DRISDOL) 1.25 MG (50000 UNIT) CAPS capsule Take 50,000 Units by mouth once a week.   Past Week at 0900  . acetaminophen (TYLENOL) 500 MG tablet Take 1 tablet (500 mg total) by mouth every 6 (six) hours as needed. 30 tablet 0 prn at prn  . meclizine (ANTIVERT) 25 MG tablet Take 25 mg by mouth 3 (three) times daily as needed. (Patient not taking: Reported on 08/18/2019)   Not Taking at Unknown time  . tranexamic acid (LYSTEDA) 650 MG TABS tablet Take 2 tablets (1,300 mg total) by mouth 3 (three) times daily. Take during menses for a maximum of five days (Patient not taking: Reported on 08/18/2019) 30 tablet 3 Not Taking at Unknown time    Assessment: Pharmacy consulted to dose heparin in this 49 year old female admitted with new-onset atrial fibrillation. Elevated troponin thought to be secondary to demand ischemia from atrial fibrillation with RVR. Pt received one dose of Xarelto  20 mg in ED on 8/2 @ 1910 but was not on any anticoag previously.   Goal of Therapy:  Heparin level 0.3-0.7 units/ml  APTT = 66 - 102  Monitor platelets by anticoagulation protocol: Yes   Plan:  --8/3 @ 1435 aPTT = 85 , therapeutic x 2 --Will continue Heparin gtt @ 900 units/hr  --Re-check HL and aPTT tomorrow morning --Will use aPTT to guide dosing until HL and aPTT are correlating  Tressie Ellis 08/19/2019,2:43 PM

## 2019-08-19 NOTE — ED Notes (Signed)
Pt's HR noted to increase to 140, this RN to bedside to assess patient. Pt noted to be in ST, pt sitting up on side of bed eating breakfast. Pt visualized in NAD, pt denies pain at this time.

## 2019-08-19 NOTE — ED Notes (Signed)
Meds administered per order. Pt tolerated well. Pt visualized in NAD, resting in bed, talking on phone at this time.

## 2019-08-19 NOTE — ED Notes (Signed)
This RN to bedside, introduced self to patient. Pt resting in bed. Pt remains on cardiac monitor at this time. Repeat blood work collected by this Char[employer] fundraiser, VS obtained by this RN at this time. Pt denies any needs, denies any pain. Lights dimmed for patient comfort. Apologized and explained delay in patient transfer upstairs to hospital bed. Pt states understanding.

## 2019-08-19 NOTE — ED Notes (Signed)
Called to the floor to give report, per secretary Shanda Bumps the RN taking the pt is busy and will call me back. Given call back number and name.

## 2019-08-20 DIAGNOSIS — I4891 Unspecified atrial fibrillation: Principal | ICD-10-CM

## 2019-08-20 LAB — APTT: aPTT: 48 seconds — ABNORMAL HIGH (ref 24–36)

## 2019-08-20 LAB — BASIC METABOLIC PANEL
Anion gap: 9 (ref 5–15)
BUN: 14 mg/dL (ref 6–20)
CO2: 24 mmol/L (ref 22–32)
Calcium: 8.9 mg/dL (ref 8.9–10.3)
Chloride: 105 mmol/L (ref 98–111)
Creatinine, Ser: 0.98 mg/dL (ref 0.44–1.00)
GFR calc Af Amer: >60 mL/min (ref >60)
GFR calc non Af Amer: >60 mL/min (ref >60)
Glucose, Bld: 115 mg/dL — ABNORMAL HIGH (ref 70–99)
Potassium: 3.6 mmol/L (ref 3.5–5.1)
Sodium: 138 mmol/L (ref 135–145)

## 2019-08-20 LAB — CBC
HCT: 30.3 % — ABNORMAL LOW (ref 36.0–46.0)
Hemoglobin: 8.1 g/dL — ABNORMAL LOW (ref 12.0–15.0)
MCH: 20 pg — ABNORMAL LOW (ref 26.0–34.0)
MCHC: 26.7 g/dL — ABNORMAL LOW (ref 30.0–36.0)
MCV: 74.6 fL — ABNORMAL LOW (ref 80.0–100.0)
Platelets: 290 10*3/uL (ref 150–400)
RBC: 4.06 MIL/uL (ref 3.87–5.11)
RDW: 18.8 % — ABNORMAL HIGH (ref 11.5–15.5)
WBC: 10 10*3/uL (ref 4.0–10.5)
nRBC: 1.4 % — ABNORMAL HIGH (ref 0.0–0.2)

## 2019-08-20 LAB — HEPARIN LEVEL (UNFRACTIONATED): Heparin Unfractionated: 0.76 IU/mL — ABNORMAL HIGH (ref 0.30–0.70)

## 2019-08-20 MED ORDER — ATORVASTATIN CALCIUM 40 MG PO TABS: 40.0000 mg | ORAL_TABLET | Freq: Every day | ORAL | 0 refills | Status: DC

## 2019-08-20 MED ORDER — METOPROLOL SUCCINATE ER 100 MG PO TB24: 100.0000 mg | ORAL_TABLET | Freq: Every day | ORAL | 0 refills | Status: DC

## 2019-08-20 MED ORDER — DILTIAZEM HCL 60 MG PO TABS: 60.0000 mg | ORAL_TABLET | Freq: Four times a day (QID) | ORAL | 0 refills | Status: DC

## 2019-08-20 NOTE — Plan of Care (Signed)

## 2019-08-20 NOTE — Progress Notes (Signed)
[facility]    SUBJECTIVE: [name] is a 49 year old female with a past medical history significant for hypertension, iron deficiency anemia requiring multiple blood transfusions in the past, and obesity who presented to the ED on 08/18/19 for chest discomfort, dizziness, and palpitations. Workup in the ED was significant for ECG revealing new onset atrial fibrillation with a rapid ventricular rate of 180bpm, hemoglobin of 8.8, hematocrit of 31.4, iron of 16, high sensitivity troponin 42, 1002, and 1144 respectively, BNP of 170, and chest xray revealing no evidence of acute cardiopulmonary disease with a moderately sized hiatal hernia.  She was started on diltiazem drip and has converted to NSR.   She recently established care with Dr. Juliann Paresallwood in an outpatient setting.  3 day Holter revealed no evidence of atrial fibrillation.  Stress test was ordered and scheduled in 1 week.   08/20/19:  On exam, [name] reports complete resolution in symptoms and denies chest pain, palpitations, heart racing sensation, shortness of breath, dizziness, lightheadedness, or syncopal/presyncopal episodes.    Vitals:   08/19/19 2319 08/20/19 0433 08/20/19 0435 08/20/19 0818  BP: (!) 168/95 (!) 168/89 (!) 159/90 (!) 161/91  Pulse: 79 88 86 94  Resp:    18  Temp:  97.6 F (36.4 C)  97.7 F (36.5 C)  TempSrc:  Oral    SpO2: 100% 100% 100% 98%  Weight:  108.9 kg    Height:         Intake/Output Summary (Last 24 hours) at 08/20/2019 [id number] Last data filed at 08/20/2019 [id number] Gross per 24 hour  Intake 626.22 ml  Output 900 ml  Net -273.78 ml      PHYSICAL EXAM  General: Well developed, well nourished, sitting up in bed, eating breakfast, in no acute distress HEENT:  Normocephalic and atramatic Neck:  No JVD.  Lungs: Clear bilaterally to auscultation and percussion. Heart: HRRR . Normal S1 and S2 without gallops or murmurs.  Abdomen: Bowel sounds are positive, abdomen soft and non-tender  Msk:  Back  normal.  Normal strength and tone for age. Extremities: No clubbing, cyanosis or edema.   Neuro: Alert and oriented X 3. Psych:  Good affect, responds appropriately   LABS: Basic Metabolic Panel: Recent Labs    08/19/19 0721 08/20/19 0625  NA 134* 138  K 3.7 3.6  CL 102 105  CO2 25 24  GLUCOSE 139* 115*  BUN 13 14  CREATININE 0.96 0.98  CALCIUM 8.9 8.9   Liver Function Tests: No results for input(s): AST, ALT, ALKPHOS, BILITOT, PROT, ALBUMIN in the last 72 hours. No results for input(s): LIPASE, AMYLASE in the last 72 hours. CBC: Recent Labs    08/19/19 0721 08/20/19 0625  WBC 9.0 10.0  HGB 8.5* 8.1*  HCT 29.4* 30.3*  MCV 71.5* 74.6*  PLT 326 290   Cardiac Enzymes: No results for input(s): CKTOTAL, CKMB, CKMBINDEX, TROPONINI in the last 72 hours. BNP: Invalid input(s): POCBNP D-Dimer: No results for input(s): DDIMER in the last 72 hours. Hemoglobin A1C: Recent Labs    08/18/19 2210  HGBA1C 6.2*   Fasting Lipid Panel: Recent Labs    08/19/19 0721  CHOL 212*  HDL 52  LDLCALC 119*  TRIG 204*  CHOLHDL 4.1   Thyroid Function Tests: Recent Labs    08/18/19 2210  TSH 1.031   Anemia Panel: Recent Labs    08/18/19 2210  FERRITIN 5*  TIBC 420  IRON 16*    DG Chest Portable 1 View  Result Date: 08/18/2019 CLINICAL DATA:  Chest pain and shortness of breath. EXAM: PORTABLE CHEST 1 VIEW COMPARISON:  June 30, 2019 FINDINGS: There is no evidence of acute infiltrate, pleural effusion or pneumothorax. The heart size and mediastinal contours are within normal limits. There is a moderate-sized hiatal hernia. The visualized skeletal structures are unremarkable. IMPRESSION: 1. No active disease. 2. Moderate-sized hiatal hernia. Electronically Signed   By: Aram Candela M.D.   On: 08/18/2019 16:14   ECHOCARDIOGRAM COMPLETE  Result Date: 08/19/2019    ECHOCARDIOGRAM REPORT   Patient Name:   Madison Lewis Date of Exam: 08/19/2019 Medical Rec #:  [id number]         Height:       64.0 in Accession #:    [id number]       Weight:       220.0 lb Date of Birth:  18-Jun-1970        BSA:          2.037 m Patient Age:    48 years         BP:           132/78 mmHg Patient Gender: F                HR:           80 bpm. Exam Location:  [facility] Procedure: 2D Echo, Cardiac Doppler and Color Doppler Indications:     Atrial Fibrillation 427.31  History:         Patient has no prior history of Echocardiogram examinations.                  Risk Factors:Hypertension and Sleep Apnea.  Sonographer:     Cristela Blue RDCS (AE) Referring Phys:  [id number] Dalia Heading Diagnosing Phys: Harold Hedge MD IMPRESSIONS  1. Left ventricular ejection fraction, by estimation, is 60 to 65%. The left ventricle has normal function. The left ventricle has no regional wall motion abnormalities. There is mild left ventricular hypertrophy. Left ventricular diastolic parameters are consistent with Grade I diastolic dysfunction (impaired relaxation).  2. Right ventricular systolic function is normal. The right ventricular size is normal. There is normal pulmonary artery systolic pressure.  3. The mitral valve is normal in structure. Trivial mitral valve regurgitation.  4. The aortic valve was not well visualized. Aortic valve regurgitation is not visualized. FINDINGS  Left Ventricle: Left ventricular ejection fraction, by estimation, is 60 to 65%. The left ventricle has normal function. The left ventricle has no regional wall motion abnormalities. The left ventricular internal cavity size was normal in size. There is  mild left ventricular hypertrophy. Left ventricular diastolic parameters are consistent with Grade I diastolic dysfunction (impaired relaxation). Right Ventricle: The right ventricular size is normal. No increase in right ventricular wall thickness. Right ventricular systolic function is normal. There is normal pulmonary artery systolic pressure. The tricuspid regurgitant velocity is 1.78 m/s, and  with an  assumed right atrial pressure of 10 mmHg, the estimated right ventricular systolic pressure is 22.7 mmHg. Left Atrium: Left atrial size was normal in size. Right Atrium: Right atrial size was normal in size. Pericardium: There is no evidence of pericardial effusion. Mitral Valve: The mitral valve is normal in structure. Trivial mitral valve regurgitation. Tricuspid Valve: The tricuspid valve is not well visualized. Tricuspid valve regurgitation is not demonstrated. Aortic Valve: The aortic valve was not well visualized. Aortic valve regurgitation is not visualized. Aortic valve mean gradient measures 6.0 mmHg. Aortic  valve peak gradient measures 10.5 mmHg. Aortic valve area, by VTI measures 2.50 cm. Pulmonic Valve: The pulmonic valve was not well visualized. Pulmonic valve regurgitation is trivial. Aorta: The aortic root is normal in size and structure. IAS/Shunts: The interatrial septum was not well visualized.  LEFT VENTRICLE PLAX 2D LVIDd:         3.51 cm  Diastology LVIDs:         2.28 cm  LV e' lateral:   3.92 cm/s LV PW:         1.32 cm  LV E/e' lateral: 15.5 LV IVS:        1.37 cm  LV e' medial:    2.50 cm/s LVOT diam:     2.00 cm  LV E/e' medial:  24.2 LV SV:         70 LV SV Index:   34 LVOT Area:     3.14 cm  RIGHT VENTRICLE RV S prime:     14.90 cm/s TAPSE (M-mode): 4.2 cm LEFT ATRIUM             Index       RIGHT ATRIUM           Index LA diam:        2.50 cm 1.23 cm/m  RA Area:     21.50 cm LA Vol (A2C):   68.9 ml 33.82 ml/m RA Volume:   67.20 ml  32.98 ml/m LA Vol (A4C):   87.3 ml 42.85 ml/m LA Biplane Vol: 77.8 ml 38.19 ml/m  AORTIC VALVE                    PULMONIC VALVE AV Area (Vmax):    2.56 cm     PV Vmax:        1.02 m/s AV Area (Vmean):   2.67 cm     PV Peak grad:   4.2 mmHg AV Area (VTI):     2.50 cm     RVOT Peak grad: 7 mmHg AV Vmax:           162.00 cm/s AV Vmean:          111.500 cm/s AV VTI:            0.280 m AV Peak Grad:      10.5 mmHg AV Mean Grad:      6.0 mmHg LVOT Vmax:          132.00 cm/s LVOT Vmean:        94.800 cm/s LVOT VTI:          0.223 m LVOT/AV VTI ratio: 0.80  AORTA Ao Root diam: 2.70 cm MITRAL VALVE               TRICUSPID VALVE MV Area (PHT): 3.06 cm    TR Peak grad:   12.7 mmHg MV Decel Time: 248 msec    TR Vmax:        178.00 cm/s MV E velocity: 60.60 cm/s MV A velocity: 75.70 cm/s  SHUNTS MV E/A ratio:  0.80        Systemic VTI:  0.22 m                            Systemic Diam: 2.00 cm Harold Hedge MD Electronically signed by Harold Hedge MD Signature Date/Time: 08/19/2019/12:27:46 PM    Final      Echo:  Normal RV and LV systolic function  with an EF estimated between 60-65% with mild LVH, grade 1 diastolic dysfunction, and no evidence of significant valvular disease.   TELEMETRY: Normal sinus rhythm   ASSESSMENT AND PLAN:  Active Problems:   Atrial fibrillation with RVR (HCC)   Atrial fibrillation (HCC)    1. Atrial fibrillation with RVR   -Currently in sinus  -Okay to discharge from a cardiac standpoint on home dose of metoprolol 100mg  daily; will add diltiazem 120mg  daily  -Will defer anticoagulation to an outpatient setting as patient has chronic anemia and has converted to NSR  -Would recommend outpatient home sleep study to assess for underlying, untreated OSA  -Recommend to follow up with Dr. or NP within 7-10 days of discharge   2. Elevated troponin   -Likely demand ischemia in the setting of atrial fibrillation with RVR   -Stress test scheduled in an outpatient setting in 1 week - encouraged to keep that appointment   3.  Anemia   -Longstanding history requiring blood transfusions in the past   4.  Hypertension   -Will add diltiazem 120mg ; will need close monitoring in an outpatient setting to adjust medications as warranted   The history, physical exam findings, and plan of care were all discussed with Dr. Harl Favor, and all decision making was made in collaboration.   9-10   PA-C 08/20/2019 9:26 AM

## 2019-08-20 NOTE — Discharge Summary (Signed)
Physician Discharge Summary  Madison SonsShaneisaka Zieger UEA:[id number] DOB: 06/15/1970 DOA: 08/18/2019  [place]P: [place], [facility]  Admit date: 08/18/2019 Discharge date: 08/20/2019  Time spent: 35 minutes  Recommendations for Outpatient Follow-up:  1. PCP in 1-2 weeks for hospital follow up for A.Fib RVR and prediabetes.  2. Cardiology hospital followup 1-2 weeks.   Discharge Diagnoses:    Atrial fibrillation with RVR (HCC): -Pt has a low chads score of 2 at 2.2 % risk for vte and due to pt's anemia Cards wants to hold any NOAC or AC therapy.  -F/U with cardiology. -Outpatient sleep study for OSA treatment.     Symptomatic anemia: -pt to f/u with pcp for anemia followup and repeat cbc.     Disorder of lipid metabolism: -Statin therapy continued.     Essential hypertension: -Continue metoprolol and diltiazem.     Prediabetes: - d/w pt about weight loss and limiting her carb intake and exercise.    Discharge Condition: Stable  Diet recommendation:  Cardiac / low carb diet.   Filed Weights   08/18/19 1540 08/19/19 1451 08/20/19 0433  Weight: 99.8 kg 109.2 kg 108.9 kg    History of present illness:  Madison Lewis is a 49 y.o. female with medical history significant of hypertension, iron deficiency anemia and OSA came to ED with complaint of sudden onset chest discomfort, dizziness and palpitations while at work. Patient sees her cardiologist recently for similar symptoms and presyncopal episodes, was given a Holter monitor for 3 days which was negative for any abnormalities.  Today at work she felt sudden onset of rotations associated with chest discomfort and some nausea.  Denies any shortness of breath.  Chest comfort was more like substernal chest pressure, nonradiating and resolved at the time of interview.  Experiencing similar episodes for the past few month when she felt some palpitations and become dizzy.  Her prior episodes were lasting only for few minutes.  She did  saw her primary care physician for those symptoms and was referred to see a cardiologist.  Saw cardiologist twice.  She was started on metoprolol.  Patient denies any recent upper respiratory symptoms, no fever or chills, no orthopnea or PND.  No nausea, vomiting or diarrhea.  No change in her appetite or bowel habits.  No urinary symptoms.  Patient is vaccinated and denies any sick contacts.   Hospital Course:  Pt admitted with a/fib rvr at work when she became tachycardic and dizzy. In the past with a Holter monitor showing no significant arrhythmias. She was given a bolus of IV Cardizem and placed on a Cardizem drip.  Troponin has peaked at 1144. Echo shows ef 55-60 with no significant valvular abnormalities.Elevated troponin-most likely demand given rapid rate and was started on diltiazem and metoprolol and HR was controlled pt was clinically doing much better. D/W her about OSA treatment and connection with  A.FIB. Pt is also advised to lose weight and also d/w her primary care about her anemia. Procedures:  N/A  Consultations: Cardiology Dr.Fath.  Discharge Exam: Vitals:   08/20/19 0818 08/20/19 1135  BP: (!) 161/91 (!) 143/91  Pulse: 94 78  Resp: 18 18  Temp: 97.7 F (36.5 C) 97.7 F (36.5 C)  SpO2: 98% 99%    Physical Exam Vitals reviewed.  HENT:     Head: Normocephalic and atraumatic.     Nose: Nose normal.  Eyes:     Extraocular Movements: Extraocular movements intact.  Cardiovascular:     Rate and  Rhythm: Regular rhythm.     Pulses: Normal pulses.     Heart sounds: Normal heart sounds.  Pulmonary:     Effort: Pulmonary effort is normal.     Breath sounds: Normal breath sounds.  Abdominal:     General: Bowel sounds are normal.     Palpations: Abdomen is soft. There is no mass.     Tenderness: There is no abdominal tenderness.  Musculoskeletal:        General: Normal range of motion.     Cervical back: Normal range of motion.  Skin:    General: Skin is warm.   Neurological:     General: No focal deficit present.     Mental Status: She is alert and oriented to person, place, and time.  Psychiatric:        Mood and Affect: Mood normal.     Discharge Instructions  Discharge Instructions    Amb referral to AFIB Clinic   Complete by: As directed    Call MD for:  difficulty breathing, headache or visual disturbances   Complete by: As directed    Call MD for:  extreme fatigue   Complete by: As directed    Call MD for:  hives   Complete by: As directed    Call MD for:  persistant dizziness or light-headedness   Complete by: As directed    Call MD for:  persistant nausea and vomiting   Complete by: As directed    Call MD for:  redness, tenderness, or signs of infection (pain, swelling, redness, odor or green/yellow discharge around incision site)   Complete by: As directed    Call MD for:  severe uncontrolled pain   Complete by: As directed    Call MD for:  temperature >100.4   Complete by: As directed    Diet - low sodium heart healthy   Complete by: As directed    Diet Carb Modified   Complete by: As directed    Discharge instructions   Complete by: As directed    F/U with PCP in one to weeks for hospital follow up.  F/U with cardiology in 1-2 weeks for hospital follow up.   Increase activity slowly   Complete by: As directed      Allergies as of 08/20/2019   No Known Allergies     Medication List    STOP taking these medications   hydrochlorothiazide 25 MG tablet Commonly known as: HYDRODIURIL   losartan 100 MG tablet Commonly known as: COZAAR     TAKE these medications   acetaminophen 500 MG tablet Commonly known as: TYLENOL Take 1 tablet (500 mg total) by mouth every 6 (six) hours as needed.   atorvastatin 40 MG tablet Commonly known as: LIPITOR Take 1 tablet (40 mg total) by mouth daily. Start taking on: August 21, 2019   diltiazem 60 MG tablet Commonly known as: CARDIZEM Take 1 tablet (60 mg total) by mouth  every 6 (six) hours.   ferrous sulfate 325 (65 FE) MG tablet Take 325 mg by mouth daily with breakfast.   FLUoxetine 10 MG capsule Commonly known as: PROZAC Take 10 mg by mouth daily.   meclizine 25 MG tablet Commonly known as: ANTIVERT Take 25 mg by mouth 3 (three) times daily as needed.   metoprolol succinate 100 MG 24 hr tablet Commonly known as: TOPROL-XL Take 1 tablet (100 mg total) by mouth daily. Take with or immediately following a meal. Start taking on: August 21, 2019   multivitamin with minerals Tabs tablet Take 1 tablet by mouth daily.   tranexamic acid 650 MG Tabs tablet Commonly known as: LYSTEDA Take 2 tablets (1,300 mg total) by mouth 3 (three) times daily. Take during menses for a maximum of five days   vitamin B-12 1000 MCG tablet Commonly known as: CYANOCOBALAMIN Take 1 tablet (1,000 mcg total) by mouth daily.   Vitamin D (Ergocalciferol) 1.25 MG (50000 UNIT) Caps capsule Commonly known as: DRISDOL Take 50,000 Units by mouth once a week.      No Known Allergies  Follow-up Information    Dalia Heading, MD. Schedule an appointment as soon as possible for a visit on 08/29/2019.   Specialty: Cardiology Why: stress test...Marland KitchenMarland KitchenMarland Kitchen At 9:30am Contact information: [address] [place] [address] [phone]                The results of significant diagnostics from this hospitalization (including imaging, microbiology, ancillary and laboratory) are listed below for reference.    Significant Diagnostic Studies: DG Chest Portable 1 View  Result Date: 08/18/2019 CLINICAL DATA:  Chest pain and shortness of breath. EXAM: PORTABLE CHEST 1 VIEW COMPARISON:  June 30, 2019 FINDINGS: There is no evidence of acute infiltrate, pleural effusion or pneumothorax. The heart size and mediastinal contours are within normal limits. There is a moderate-sized hiatal hernia. The visualized skeletal structures are unremarkable. IMPRESSION: 1. No active disease. 2.  Moderate-sized hiatal hernia. Electronically Signed   By: Aram Candela M.D.   On: 08/18/2019 16:14   ECHOCARDIOGRAM COMPLETE  Result Date: 08/19/2019    ECHOCARDIOGRAM REPORT   Patient Name:   Madison Lewis Date of Exam: 08/19/2019 Medical Rec #:  [id number]        Height:       64.0 in Accession #:    [id number]       Weight:       220.0 lb Date of Birth:  11-13-1970        BSA:          2.037 m Patient Age:    48 years         BP:           132/78 mmHg Patient Gender: F                HR:           80 bpm. Exam Location:  [facility] Procedure: 2D Echo, Cardiac Doppler and Color Doppler Indications:     Atrial Fibrillation 427.31  History:         Patient has no prior history of Echocardiogram examinations.                  Risk Factors:Hypertension and Sleep Apnea.  Sonographer:     Cristela Blue RDCS (AE) Referring Phys:  [id number] Dalia Heading Diagnosing Phys: Harold Hedge MD IMPRESSIONS  1. Left ventricular ejection fraction, by estimation, is 60 to 65%. The left ventricle has normal function. The left ventricle has no regional wall motion abnormalities. There is mild left ventricular hypertrophy. Left ventricular diastolic parameters are consistent with Grade I diastolic dysfunction (impaired relaxation).  2. Right ventricular systolic function is normal. The right ventricular size is normal. There is normal pulmonary artery systolic pressure.  3. The mitral valve is normal in structure. Trivial mitral valve regurgitation.  4. The aortic valve was not well visualized. Aortic valve regurgitation is not visualized. FINDINGS  Left Ventricle: Left ventricular ejection  fraction, by estimation, is 60 to 65%. The left ventricle has normal function. The left ventricle has no regional wall motion abnormalities. The left ventricular internal cavity size was normal in size. There is  mild left ventricular hypertrophy. Left ventricular diastolic parameters are consistent with Grade I diastolic dysfunction (impaired  relaxation). Right Ventricle: The right ventricular size is normal. No increase in right ventricular wall thickness. Right ventricular systolic function is normal. There is normal pulmonary artery systolic pressure. The tricuspid regurgitant velocity is 1.78 m/s, and  with an assumed right atrial pressure of 10 mmHg, the estimated right ventricular systolic pressure is 22.7 mmHg. Left Atrium: Left atrial size was normal in size. Right Atrium: Right atrial size was normal in size. Pericardium: There is no evidence of pericardial effusion. Mitral Valve: The mitral valve is normal in structure. Trivial mitral valve regurgitation. Tricuspid Valve: The tricuspid valve is not well visualized. Tricuspid valve regurgitation is not demonstrated. Aortic Valve: The aortic valve was not well visualized. Aortic valve regurgitation is not visualized. Aortic valve mean gradient measures 6.0 mmHg. Aortic valve peak gradient measures 10.5 mmHg. Aortic valve area, by VTI measures 2.50 cm. Pulmonic Valve: The pulmonic valve was not well visualized. Pulmonic valve regurgitation is trivial. Aorta: The aortic root is normal in size and structure. IAS/Shunts: The interatrial septum was not well visualized.  LEFT VENTRICLE PLAX 2D LVIDd:         3.51 cm  Diastology LVIDs:         2.28 cm  LV e' lateral:   3.92 cm/s LV PW:         1.32 cm  LV E/e' lateral: 15.5 LV IVS:        1.37 cm  LV e' medial:    2.50 cm/s LVOT diam:     2.00 cm  LV E/e' medial:  24.2 LV SV:         70 LV SV Index:   34 LVOT Area:     3.14 cm  RIGHT VENTRICLE RV S prime:     14.90 cm/s TAPSE (M-mode): 4.2 cm LEFT ATRIUM             Index       RIGHT ATRIUM           Index LA diam:        2.50 cm 1.23 cm/m  RA Area:     21.50 cm LA Vol (A2C):   68.9 ml 33.82 ml/m RA Volume:   67.20 ml  32.98 ml/m LA Vol (A4C):   87.3 ml 42.85 ml/m LA Biplane Vol: 77.8 ml 38.19 ml/m  AORTIC VALVE                    PULMONIC VALVE AV Area (Vmax):    2.56 cm     PV Vmax:         1.02 m/s AV Area (Vmean):   2.67 cm     PV Peak grad:   4.2 mmHg AV Area (VTI):     2.50 cm     RVOT Peak grad: 7 mmHg AV Vmax:           162.00 cm/s AV Vmean:          111.500 cm/s AV VTI:            0.280 m AV Peak Grad:      10.5 mmHg AV Mean Grad:      6.0 mmHg LVOT Vmax:  132.00 cm/s LVOT Vmean:        94.800 cm/s LVOT VTI:          0.223 m LVOT/AV VTI ratio: 0.80  AORTA Ao Root diam: 2.70 cm MITRAL VALVE               TRICUSPID VALVE MV Area (PHT): 3.06 cm    TR Peak grad:   12.7 mmHg MV Decel Time: 248 msec    TR Vmax:        178.00 cm/s MV E velocity: 60.60 cm/s MV A velocity: 75.70 cm/s  SHUNTS MV E/A ratio:  0.80        Systemic VTI:  0.22 m                            Systemic Diam: 2.00 cm Harold Hedge MD Electronically signed by Harold Hedge MD Signature Date/Time: 08/19/2019/12:27:46 PM    Final     Microbiology: Recent Results (from the past 240 hour(s))  SARS Coronavirus 2 by RT PCR (hospital order, performed in [facility] hospital lab) Nasopharyngeal Nasopharyngeal Swab     Status: None   Collection Time: 08/18/19  5:47 PM   Specimen: Nasopharyngeal Swab  Result Value Ref Range Status   SARS Coronavirus 2 NEGATIVE NEGATIVE Final    Comment: (NOTE) SARS-CoV-2 target nucleic acids are NOT DETECTED.  The SARS-CoV-2 RNA is generally detectable in upper and lower respiratory specimens during the acute phase of infection. The lowest concentration of SARS-CoV-2 viral copies this assay can detect is 250 copies / mL. A negative result does not preclude SARS-CoV-2 infection and should not be used as the sole basis for treatment or other patient management decisions.  A negative result may occur with improper specimen collection / handling, submission of specimen other than nasopharyngeal swab, presence of viral mutation(s) within the areas targeted by this assay, and inadequate number of viral copies (<250 copies / mL). A negative result must be combined with  clinical observations, patient history, and epidemiological information.  Fact Sheet for Patients:   BoilerBrush.com.cy  Fact Sheet for Healthcare Providers: https://pope.com/  This test is not yet approved or  cleared by the [place] FDA and has been authorized for detection and/or diagnosis of SARS-CoV-2 by FDA under an Emergency Use Authorization (EUA).  This EUA will remain in effect (meaning this test can be used) for the duration of the COVID-19 declaration under Section 564(b)(1) of the Act, 21 U.S.C. section 360bbb-3(b)(1), unless the authorization is terminated or revoked sooner.  Performed at [facility], [address] [place], [place] [address]      Labs: Basic Metabolic Panel: Recent Labs  Lab 08/18/19 1543 08/19/19 0721 08/20/19 0625  NA 135 134* 138  K 3.9 3.7 3.6  CL 106 102 105  CO2 22 25 24   GLUCOSE 114* 139* 115*  BUN 13 13 14   CREATININE 1.01* 0.96 0.98  CALCIUM 8.4* 8.9 8.9   Liver Function Tests: No results for input(s): AST, ALT, ALKPHOS, BILITOT, PROT, ALBUMIN in the last 168 hours. No results for input(s): LIPASE, AMYLASE in the last 168 hours. No results for input(s): AMMONIA in the last 168 hours. CBC: Recent Labs  Lab 08/18/19 1543 08/19/19 0721 08/20/19 0625  WBC 9.0 9.0 10.0  HGB 8.8* 8.5* 8.1*  HCT 31.4* 29.4* 30.3*  MCV 72.5* 71.5* 74.6*  PLT 295 326 290   Cardiac Enzymes: No results for input(s): CKTOTAL, CKMB,  CKMBINDEX, TROPONINI in the last 168 hours. BNP: BNP (last 3 results) Recent Labs    08/18/19 2210  BNP 170.1*    ProBNP (last 3 results) No results for input(s): PROBNP in the last 8760 hours.  CBG: No results for input(s): GLUCAP in the last 168 hours.     Signed:  Gertha Calkin MD.  [facility] 08/20/2019, 3:42 PM

## 2019-08-20 NOTE — Progress Notes (Signed)
ANTICOAGULATION CONSULT NOTE   Pharmacy Consult for Heparin  Indication: chest pain/ACS  No Known Allergies  Patient Measurements: Height: 5\' 4"  (162.6 cm) Weight: 108.9 kg (240 lb 1.6 oz) IBW/kg (Calculated) : 54.7 Heparin Dosing Weight: 77.8 kg  Vital Signs: Temp: 97.6 F (36.4 C) (08/04 0433) Temp Source: Oral (08/04 0433) BP: 159/90 (08/04 0435) Pulse Rate: 86 (08/04 0435)  Labs: Recent Labs    08/18/19 1543 08/18/19 1543 08/18/19 1552 08/18/19 2210 08/19/19 0028 08/19/19 0721 08/19/19 1435 08/20/19 0625  HGB 8.8*   < >  --   --   --  8.5*  --  8.1*  HCT 31.4*  --   --   --   --  29.4*  --  30.3*  PLT 295  --   --   --   --  326  --  290  APTT  --    < >  --   --  29 83* 85* 48*  LABPROT  --   --   --   --  14.6  --   --   --   INR  --   --   --   --  1.2  --   --   --   HEPARINUNFRC  --   --   --   --  1.34* >3.60*  --  0.76*  CREATININE 1.01*  --   --   --   --  0.96  --  0.98  TROPONINIHS  --   --  42* 1,002* 1,144*  --   --   --    < > = values in this interval not displayed.    Estimated Creatinine Clearance: 82.8 mL/min (by C-G formula based on SCr of 0.98 mg/dL).   Medical History: Past Medical History:  Diagnosis Date  . Blood transfusion without reported diagnosis   . Headache   . Hypertension   . Iron deficiency anemia due to chronic blood loss 07/14/2018  . Sleep apnea    CPAP ordered but can't tolerate it    Medications:  Medications Prior to Admission  Medication Sig Dispense Refill Last Dose  . ferrous sulfate 325 (65 FE) MG tablet Take 325 mg by mouth daily with breakfast.   08/17/2019 at 0900  . FLUoxetine (PROZAC) 10 MG capsule Take 10 mg by mouth daily.   08/17/2019 at 0900  . hydrochlorothiazide (HYDRODIURIL) 25 MG tablet Take 25 mg by mouth daily.   08/17/2019 at 0900  . losartan (COZAAR) 100 MG tablet Take 100 mg by mouth daily.   08/17/2019 at 0900  . metoprolol succinate (TOPROL-XL) 100 MG 24 hr tablet Take 100 mg by mouth daily. Take  with or immediately following a meal.   08/17/2019 at 0900  . Multiple Vitamin (MULTIVITAMIN WITH MINERALS) TABS tablet Take 1 tablet by mouth daily.   08/17/2019 at 0900  . vitamin B-12 (CYANOCOBALAMIN) 1000 MCG tablet Take 1 tablet (1,000 mcg total) by mouth daily. 90 tablet 3 08/17/2019 at 0900  . Vitamin D, Ergocalciferol, (DRISDOL) 1.25 MG (50000 UNIT) CAPS capsule Take 50,000 Units by mouth once a week.   Past Week at 0900  . acetaminophen (TYLENOL) 500 MG tablet Take 1 tablet (500 mg total) by mouth every 6 (six) hours as needed. 30 tablet 0 prn at prn  . meclizine (ANTIVERT) 25 MG tablet Take 25 mg by mouth 3 (three) times daily as needed. (Patient not taking: Reported on 08/18/2019)   Not Taking at  Unknown time  . tranexamic acid (LYSTEDA) 650 MG TABS tablet Take 2 tablets (1,300 mg total) by mouth 3 (three) times daily. Take during menses for a maximum of five days (Patient not taking: Reported on 08/18/2019) 30 tablet 3 Not Taking at Unknown time    Assessment: Pharmacy consulted to dose heparin in this 49 year old female admitted with new-onset atrial fibrillation. Elevated troponin thought to be secondary to demand ischemia from atrial fibrillation with RVR. Pt received one dose of Xarelto 20 mg in ED on 8/2 @ 1910 for new onset afib.  8/3 0721 HL > 3.6 aPTT 83  8/3 1435 aPTT 85  8/4 0625 HL 0.76 aPTT 48 increase heparin to 1050 units/hr.   Goal of Therapy:  Heparin level 0.3-0.7 units/ml once heparin level and aPTT correlate.  APTT = 66 - 102  Monitor platelets by anticoagulation protocol: Yes   Plan:  APTT and heparin level still aren't correlating. APTT subtherapeutic. Will increase heparin to 1050 units/hr. No bolus. Recheck aPTT in 6 hours. CBC daily while on heparin.   Ronnald Ramp, PharmD, BCPS 08/20/2019,7:49 AM

## 2019-08-20 NOTE — Progress Notes (Signed)
Madison Lewis to be D/C'd Home per MD order.  Discussed prescriptions and follow up appointments with the patient. Prescriptions given to patient, medication list explained in detail. Pt verbalized understanding.  Allergies as of 08/20/2019   No Known Allergies     Medication List    STOP taking these medications   hydrochlorothiazide 25 MG tablet Commonly known as: HYDRODIURIL   losartan 100 MG tablet Commonly known as: COZAAR     TAKE these medications   acetaminophen 500 MG tablet Commonly known as: TYLENOL Take 1 tablet (500 mg total) by mouth every 6 (six) hours as needed.   atorvastatin 40 MG tablet Commonly known as: LIPITOR Take 1 tablet (40 mg total) by mouth daily. Start taking on: August 21, 2019   diltiazem 60 MG tablet Commonly known as: CARDIZEM Take 1 tablet (60 mg total) by mouth every 6 (six) hours.   ferrous sulfate 325 (65 FE) MG tablet Take 325 mg by mouth daily with breakfast.   FLUoxetine 10 MG capsule Commonly known as: PROZAC Take 10 mg by mouth daily.   meclizine 25 MG tablet Commonly known as: ANTIVERT Take 25 mg by mouth 3 (three) times daily as needed.   metoprolol succinate 100 MG 24 hr tablet Commonly known as: TOPROL-XL Take 1 tablet (100 mg total) by mouth daily. Take with or immediately following a meal. Start taking on: August 21, 2019   multivitamin with minerals Tabs tablet Take 1 tablet by mouth daily.   tranexamic acid 650 MG Tabs tablet Commonly known as: LYSTEDA Take 2 tablets (1,300 mg total) by mouth 3 (three) times daily. Take during menses for a maximum of five days   vitamin B-12 1000 MCG tablet Commonly known as: CYANOCOBALAMIN Take 1 tablet (1,000 mcg total) by mouth daily.   Vitamin D (Ergocalciferol) 1.25 MG (50000 UNIT) Caps capsule Commonly known as: DRISDOL Take 50,000 Units by mouth once a week.       Vitals:   08/20/19 0818 08/20/19 1135  BP: (!) 161/91 (!) 143/91  Pulse: 94 78  Resp: 18 18   Temp: 97.7 F (36.5 C) 97.7 F (36.5 C)  SpO2: 98% 99%    Tele box removed and returned. Skin clean, dry and intact without evidence of skin break down, no evidence of skin tears noted. IV catheter discontinued intact. Site without signs and symptoms of complications. Dressing and pressure applied. Pt denies pain at this time. No complaints noted.  An After Visit Summary was printed and given to the patient. Patient escorted via WC, and D/C home via private auto.  Madison Lewis

## 2019-08-28 ENCOUNTER — Telehealth (HOSPITAL_COMMUNITY): Payer: Self-pay

## 2019-08-28 NOTE — Telephone Encounter (Signed)
Pt called to schedule new patient appt. She is being referred from ED for new onset Afib. Notes in Epic. Pt scheduled 09/05/19 at 9:30 am with Jorja Loa, PA-C. She is aware and agreeable to appt. Pt given parking code for Aug as [id number] and address is on ED discharge paperwork. She is encouraged to all office with any concerns.

## 2019-09-03 ENCOUNTER — Ambulatory Visit (HOSPITAL_COMMUNITY): Payer: BC Managed Care – PPO | Admitting: Physician Assistant

## 2019-09-05 ENCOUNTER — Inpatient Hospital Stay: Payer: BC Managed Care – PPO

## 2019-09-05 ENCOUNTER — Ambulatory Visit (HOSPITAL_COMMUNITY): Payer: BC Managed Care – PPO | Admitting: Physician Assistant

## 2019-09-05 ENCOUNTER — Other Ambulatory Visit: Payer: Self-pay

## 2019-09-05 ENCOUNTER — Inpatient Hospital Stay: Payer: BC Managed Care – PPO | Attending: Oncology | Admitting: Oncology

## 2019-09-05 ENCOUNTER — Encounter: Payer: Self-pay | Admitting: Oncology

## 2019-09-05 VITALS — BP 171/104 | HR 98 | Temp 97.8°F | Resp 20 | Wt 240.8 lb

## 2019-09-05 DIAGNOSIS — D500 Iron deficiency anemia secondary to blood loss (chronic): Secondary | ICD-10-CM

## 2019-09-05 DIAGNOSIS — E538 Deficiency of other specified B group vitamins: Secondary | ICD-10-CM

## 2019-09-05 DIAGNOSIS — D509 Iron deficiency anemia, unspecified: Secondary | ICD-10-CM | POA: Diagnosis present

## 2019-09-05 DIAGNOSIS — N189 Chronic kidney disease, unspecified: Secondary | ICD-10-CM | POA: Diagnosis not present

## 2019-09-05 LAB — HEPATIC FUNCTION PANEL
ALT: 31 U/L (ref 0–44)
AST: 25 U/L (ref 15–41)
Albumin: 3.6 g/dL (ref 3.5–5.0)
Alkaline Phosphatase: 85 U/L (ref 38–126)
Bilirubin, Direct: 0.1 mg/dL (ref 0.0–0.2)
Indirect Bilirubin: 0.2 mg/dL — ABNORMAL LOW (ref 0.3–0.9)
Total Bilirubin: 0.3 mg/dL (ref 0.3–1.2)
Total Protein: 7.4 g/dL (ref 6.5–8.1)

## 2019-09-05 LAB — VITAMIN B12: Vitamin B-12: 440 pg/mL (ref 180–914)

## 2019-09-05 NOTE — Progress Notes (Signed)
Hematology/Oncology follow up  note [facility] [phone] [phone]) [phone]   Patient Care Team: [facility] as PCP - General  REFERRING PROVIDER: [facility]*  CHIEF COMPLAINTS/REASON FOR VISIT:  Follow up for  anemia  HISTORY OF PRESENTING ILLNESS:  Madison Lewis is a  49 y.o.  female with PMH listed below was seen in consultation at the request of  Norma Fredrickson, Boykin Nearing, MD  for evaluation of anemia.   Reviewed patient's recent labs 07/06/2018 Labs revealed anemia with hemoglobin of 6.6, MCV 76.1.  Platelet count 522. Patient was advised by Dr. Norma Fredrickson to go to emergency room.  She had hospitalization from 07/05/2020 07/07/2018. She received 2 units of packed RBC transfusion, received IV iron during admission as well. Pelvic ultrasound showed mild endometrial thickening Anemia work-up consistent with iron deficiency anemia and low B12 level. She also received a B12 injection during admission.  Today patient reports feeling slightly better.  Fatigue has improved after recent blood transfusion and IV Venofer 400mg  x1. Patient craves for ice.  Associated signs and symptoms: Patient reports fatigue.  SOB with exertion.  Denies weight loss, easy bruising, hematochezia, hemoptysis, hematuria. Context: History of GI bleeding: Denies               History of Chronic kidney disease; CKD               History of autoimmune disease denies               History of hemolytic anemia.  Denies               Last colonoscopy: Colonoscopy and upper endoscopy 05/19/2016 showed nonbleeding internal hemorrhoids and hiatal hernia.   INTERVAL HISTORY Madison Lewis is a 49 y.o. female who has above history reviewed by me today presents for follow up visit for management of anemia Problems and complaints are listed below: No menstrual.  After endometrial ablation.  Anemia was thought to be multifactorial, due to chronic  kidney disease and iron deficiency.  Patient received IV Venofer weekly x2 during the interval.  Patient is also taking oral iron supplementation. Today she reports feeling well.  Energy level has improved. Patient was seen by GYN for heavy menstrual bleeding.  Status post D&C and endometrial ablation. Patient was recently hospitalized due to atrial fibrillation with RVR, she has a low chads score of 2 and due to her anemia, cardiology held anticoagulation. Intermittently she has blood in the stool after eating spicy food, she attributes to hemorrhoid bleeding. Last colonoscopy was done in 2018 by Dr. 2019  Review of Systems  Constitutional: Positive for fatigue. Negative for appetite change, chills and fever.  HENT:   Negative for hearing loss and voice change.   Eyes: Negative for eye problems.  Respiratory: Negative for chest tightness, cough and shortness of breath.   Cardiovascular: Negative for chest pain.  Gastrointestinal: Negative for abdominal distention, abdominal pain and blood in stool.  Endocrine: Negative for hot flashes.  Genitourinary: Negative for difficulty urinating and frequency.   Musculoskeletal: Negative for arthralgias.  Skin: Negative for itching and rash.  Neurological: Negative for extremity weakness.  Hematological: Negative for adenopathy.  Psychiatric/Behavioral: Negative for confusion.    MEDICAL HISTORY:  Past Medical History:  Diagnosis Date  . Blood transfusion without reported diagnosis   . Headache   . Hypertension   . Iron deficiency anemia due to chronic blood loss 07/14/2018  . Sleep apnea  CPAP ordered but can't tolerate it    SURGICAL HISTORY: Past Surgical History:  Procedure Laterality Date  . CESAREAN SECTION    . COLONOSCOPY WITH PROPOFOL N/A 05/19/2016   Procedure: COLONOSCOPY WITH PROPOFOL;  Surgeon: Midge Minium, MD;  Location: [facility] ENDOSCOPY;  Service: Endoscopy;  Laterality: N/A;  . ESOPHAGOGASTRODUODENOSCOPY (EGD) WITH  PROPOFOL N/A 05/19/2016   Procedure: ESOPHAGOGASTRODUODENOSCOPY (EGD) WITH PROPOFOL;  Surgeon: Midge Minium, MD;  Location: [facility] ENDOSCOPY;  Service: Endoscopy;  Laterality: N/A;  . HYSTEROSCOPY WITH D & C N/A 08/13/2018   Procedure: DILATATION AND CURETTAGE /HYSTEROSCOPY WITH ENDOMETRIAL ABLATION;  Surgeon: Natale Milch, MD;  Location: [facility] ORS;  Service: Gynecology;  Laterality: N/A;  . KNEE SURGERY Bilateral   . TUBAL LIGATION  1997    SOCIAL HISTORY: Social History   Socioeconomic History  . Marital status: Single    Spouse name: Not on file  . Number of children: Not on file  . Years of education: Not on file  . Highest education level: Not on file  Occupational History  . Not on file  Tobacco Use  . Smoking status: Never Smoker  . Smokeless tobacco: Never Used  Vaping Use  . Vaping Use: Never used  Substance and Sexual Activity  . Alcohol use: Yes    Alcohol/week: 1.0 standard drink    Types: 1 Cans of beer per week  . Drug use: No  . Sexual activity: Not Currently    Birth control/protection: Surgical    Comment: Tubal ligation   Other Topics Concern  . Not on file  Social History Narrative  . Not on file   Social Determinants of Health   Financial Resource Strain:   . Difficulty of Paying Living Expenses: Not on file  Food Insecurity:   . Worried About Programme researcher, broadcasting/film/video in the Last Year: Not on file  . Ran Out of Food in the Last Year: Not on file  Transportation Needs:   . Lack of Transportation (Medical): Not on file  . Lack of Transportation (Non-Medical): Not on file  Physical Activity:   . Days of Exercise per Week: Not on file  . Minutes of Exercise per Session: Not on file  Stress:   . Feeling of Stress : Not on file  Social Connections:   . Frequency of Communication with Friends and Family: Not on file  . Frequency of Social Gatherings with Friends and Family: Not on file  . Attends Religious Services: Not on file  . Active Member of  Clubs or Organizations: Not on file  . Attends Banker Meetings: Not on file  . Marital Status: Not on file  Intimate Partner Violence:   . Fear of Current or Ex-Partner: Not on file  . Emotionally Abused: Not on file  . Physically Abused: Not on file  . Sexually Abused: Not on file    FAMILY HISTORY: Family History  Problem Relation Age of Onset  . Hypertension Father   . Pancreatic cancer Father   . CAD Mother   . Diabetes Mother   . Cancer Paternal Grandmother        unknown  . Cancer Paternal Grandfather        unknown  . Breast cancer Neg Hx     ALLERGIES:  has No Known Allergies.  MEDICATIONS:  Current Outpatient Medications  Medication Sig Dispense Refill  . acetaminophen (TYLENOL) 500 MG tablet Take 1 tablet (500 mg total) by mouth every 6 (six) hours as  needed. 30 tablet 0  . atorvastatin (LIPITOR) 40 MG tablet Take 1 tablet (40 mg total) by mouth daily. 30 tablet 0  . diltiazem (CARDIZEM) 60 MG tablet Take 1 tablet (60 mg total) by mouth every 6 (six) hours. 120 tablet 0  . ferrous sulfate 325 (65 FE) MG tablet Take 325 mg by mouth daily with breakfast.    . FLUoxetine (PROZAC) 10 MG capsule Take 10 mg by mouth daily.    . metoprolol succinate (TOPROL-XL) 100 MG 24 hr tablet Take 1 tablet (100 mg total) by mouth daily. Take with or immediately following a meal. 30 tablet 0  . Multiple Vitamin (MULTIVITAMIN WITH MINERALS) TABS tablet Take 1 tablet by mouth daily.    . vitamin B-12 (CYANOCOBALAMIN) 1000 MCG tablet Take 1 tablet (1,000 mcg total) by mouth daily. 90 tablet 3  . Vitamin D, Ergocalciferol, (DRISDOL) 1.25 MG (50000 UNIT) CAPS capsule Take 50,000 Units by mouth once a week.    . meclizine (ANTIVERT) 25 MG tablet Take 25 mg by mouth 3 (three) times daily as needed. (Patient not taking: Reported on 08/18/2019)    . tranexamic acid (LYSTEDA) 650 MG TABS tablet Take 2 tablets (1,300 mg total) by mouth 3 (three) times daily. Take during menses for a  maximum of five days (Patient not taking: Reported on 08/18/2019) 30 tablet 3   No current facility-administered medications for this visit.     PHYSICAL EXAMINATION: ECOG PERFORMANCE STATUS: 1 - Symptomatic but completely ambulatory Vitals:   09/05/19 1031  BP: (!) 171/104  Pulse: 98  Resp: 20  Temp: 97.8 F (36.6 C)  SpO2: 100%   Filed Weights   09/05/19 1031  Weight: 240 lb 12.8 oz (109.2 kg)    Physical Exam Constitutional:      General: She is not in acute distress. HENT:     Head: Normocephalic and atraumatic.  Eyes:     General: No scleral icterus.    Pupils: Pupils are equal, round, and reactive to light.  Cardiovascular:     Rate and Rhythm: Normal rate and regular rhythm.     Heart sounds: Normal heart sounds.  Pulmonary:     Effort: Pulmonary effort is normal. No respiratory distress.     Breath sounds: No wheezing.  Abdominal:     General: Bowel sounds are normal. There is no distension.     Palpations: Abdomen is soft. There is no mass.     Tenderness: There is no abdominal tenderness.  Musculoskeletal:        General: No deformity. Normal range of motion.     Cervical back: Normal range of motion and neck supple.  Skin:    General: Skin is warm and dry.     Findings: No erythema or rash.  Neurological:     Mental Status: She is alert and oriented to person, place, and time.     Cranial Nerves: No cranial nerve deficit.     Coordination: Coordination normal.  Psychiatric:        Behavior: Behavior normal.        Thought Content: Thought content normal.      LABORATORY DATA:  I have reviewed the data as listed Lab Results  Component Value Date   WBC 10.0 08/20/2019   HGB 8.1 (L) 08/20/2019   HCT 30.3 (L) 08/20/2019   MCV 74.6 (L) 08/20/2019   PLT 290 08/20/2019   Recent Labs    06/30/19 1912 06/30/19 1912 08/18/19 1543 08/19/19 [id number]  08/20/19 0625 09/05/19 1109  NA 138   < > 135 134* 138  --   K 4.2   < > 3.9 3.7 3.6  --   CL 105    < > 106 102 105  --   CO2 25   < > 22 25 24   --   GLUCOSE 102*   < > 114* 139* 115*  --   BUN 20   < > 13 13 14   --   CREATININE 1.11*   < > 1.01* 0.96 0.98  --   CALCIUM 9.3   < > 8.4* 8.9 8.9  --   GFRNONAA 58*   < > >60 >60 >60  --   GFRAA >60   < > >60 >60 >60  --   PROT 7.2  --   --   --   --  7.4  ALBUMIN 3.7  --   --   --   --  3.6  AST 29  --   --   --   --  25  ALT 28  --   --   --   --  31  ALKPHOS 78  --   --   --   --  85  BILITOT 0.5  --   --   --   --  0.3  BILIDIR  --   --   --   --   --  0.1  IBILI  --   --   --   --   --  0.2*   < > = values in this interval not displayed.   Iron/TIBC/Ferritin/ %Sat    Component Value Date/Time   IRON 16 (L) 08/18/2019 2210   TIBC 420 08/18/2019 2210   FERRITIN 5 (L) 08/18/2019 2210   IRONPCTSAT 4 (L) 08/18/2019 2210     RADIOGRAPHIC STUDIES: I have personally reviewed the radiological images as listed and agreed with the findings in the report. 07/07/2018 10/18/2019 pelvis Mild endometrial thickening in this perimenopausal patient with bleeding. Recommend GYN consultation  #07/10/2018 hemoglobinopathy evaluation reviewed normal.  ASSESSMENT & PLAN:  1. Iron deficiency anemia due to chronic blood loss   2. Low serum vitamin B12    #Iron deficiency anemia, Suspect blood loss from GI tract.  She no longer has menstrual period Labs are reviewed and discussed with patient.  Consistent with iron deficiency Plan IV Venofer treatment 200 mg weekly x4.  #Low serum B12, will recheck B12 today. I recommend patient to reestablish care with gastroenterology for further evaluation.  She may need capsule study. Information of gastroenterology office was provided to patient.  Orders Placed This Encounter  Procedures  . Hepatic function panel    Standing Status:   Future    Number of Occurrences:   1    Standing Expiration Date:   09/04/2020  . Vitamin B12    Standing Status:   Future    Number of Occurrences:   1    Standing Expiration  Date:   09/04/2020    All questions were answered. The patient knows to call the clinic with any problems questions or concerns.    09/06/2020, MD, PhD  09/05/2019

## 2019-09-09 ENCOUNTER — Encounter: Payer: Self-pay | Admitting: Internal Medicine

## 2019-09-09 ENCOUNTER — Other Ambulatory Visit: Payer: Self-pay

## 2019-09-09 ENCOUNTER — Ambulatory Visit: Payer: BC Managed Care – PPO | Admitting: Internal Medicine

## 2019-09-09 VITALS — BP 180/110 | HR 93 | Temp 97.3°F | Resp 16 | Ht 64.0 in | Wt 238.2 lb

## 2019-09-09 DIAGNOSIS — G4733 Obstructive sleep apnea (adult) (pediatric): Secondary | ICD-10-CM

## 2019-09-09 DIAGNOSIS — I4819 Other persistent atrial fibrillation: Secondary | ICD-10-CM | POA: Diagnosis not present

## 2019-09-09 DIAGNOSIS — R0683 Snoring: Secondary | ICD-10-CM

## 2019-09-09 DIAGNOSIS — I10 Essential (primary) hypertension: Secondary | ICD-10-CM

## 2019-09-09 NOTE — Progress Notes (Signed)
[facility] [address] [place], [place] [address]  Pulmonary Sleep Medicine   Office Visit Note  Patient Name: Madison Lewis DOB: [birth year] MRN [id number]  Date of Service: 09/09/2019  Complaints/HPI: Snoring, fatigue, A fib, Weight gain, withnessed apnea Patient is here as a new patient for the above complaints.  She states that she has a lot of snoring notes that family has told her that she snores and chokes and gasps at nighttime.  She also feels tired during the daytime.  The patient states that she also had developed atrial fibrillation and has had significant weight gain.  Patient is also observed significant witnessed apneas.  She states that she wakes up tired she is sleepy during the daytime.  She does try to avoid taking naps but sometimes she will fall asleep.  She states that she has not fallen asleep while driving.  Patient has no chest pain no palpitations.  She does have a history of uncontrolled hypertension and also has chronic headache issues.  Patient does also suffer from iron deficiency and is on supplemental iron.   ROS  General: (-) fever, (-) chills, (-) night sweats, (-) weakness Skin: (-) rashes, (-) itching,. Eyes: (-) visual changes, (-) redness, (-) itching. Nose and Sinuses: (-) nasal stuffiness or itchiness, (-) postnasal drip, (-) nosebleeds, (-) sinus trouble. Mouth and Throat: (-) sore throat, (-) hoarseness. Neck: (-) swollen glands, (-) enlarged thyroid, (-) neck pain. Respiratory: + cough, (-) bloody sputum, + shortness of breath, - wheezing. Cardiovascular: + ankle swelling, (-) chest pain. Lymphatic: (-) lymph node enlargement. Neurologic: (-) numbness, (-) tingling. Psychiatric: (-) anxiety, (-) depression   Current Medication: Outpatient Encounter Medications as of 09/09/2019  Medication Sig Note  . acetaminophen (TYLENOL) 500 MG tablet Take 1 tablet (500 mg total) by mouth every 6 (six) hours as needed.   Marland Kitchen  atorvastatin (LIPITOR) 40 MG tablet Take 1 tablet (40 mg total) by mouth daily.   Marland Kitchen diltiazem (CARDIZEM) 60 MG tablet Take 1 tablet (60 mg total) by mouth every 6 (six) hours.   . ferrous sulfate 325 (65 FE) MG tablet Take 325 mg by mouth daily with breakfast.   . FLUoxetine (PROZAC) 10 MG capsule Take 10 mg by mouth daily.   . metoprolol succinate (TOPROL-XL) 100 MG 24 hr tablet Take 1 tablet (100 mg total) by mouth daily. Take with or immediately following a meal.   . Multiple Vitamin (MULTIVITAMIN WITH MINERALS) TABS tablet Take 1 tablet by mouth daily.   . vitamin B-12 (CYANOCOBALAMIN) 1000 MCG tablet Take 1 tablet (1,000 mcg total) by mouth daily.   . Vitamin D, Ergocalciferol, (DRISDOL) 1.25 MG (50000 UNIT) CAPS capsule Take 50,000 Units by mouth once a week. 08/18/2019: Takes medication each Monday   . [DISCONTINUED] meclizine (ANTIVERT) 25 MG tablet Take 25 mg by mouth 3 (three) times daily as needed. (Patient not taking: Reported on 08/18/2019)   . [DISCONTINUED] tranexamic acid (LYSTEDA) 650 MG TABS tablet Take 2 tablets (1,300 mg total) by mouth 3 (three) times daily. Take during menses for a maximum of five days (Patient not taking: Reported on 08/18/2019)    No facility-administered encounter medications on file as of 09/09/2019.    Surgical History: Past Surgical History:  Procedure Laterality Date  . CESAREAN SECTION    . COLONOSCOPY WITH PROPOFOL N/A 05/19/2016   Procedure: COLONOSCOPY WITH PROPOFOL;  Surgeon: Midge Minium, MD;  Location: [facility] ENDOSCOPY;  Service: Endoscopy;  Laterality: N/A;  . ESOPHAGOGASTRODUODENOSCOPY (  EGD) WITH PROPOFOL N/A 05/19/2016   Procedure: ESOPHAGOGASTRODUODENOSCOPY (EGD) WITH PROPOFOL;  Surgeon: Midge Minium, MD;  Location: [facility] ENDOSCOPY;  Service: Endoscopy;  Laterality: N/A;  . HYSTEROSCOPY WITH D & C N/A 08/13/2018   Procedure: DILATATION AND CURETTAGE /HYSTEROSCOPY WITH ENDOMETRIAL ABLATION;  Surgeon: Natale Milch, MD;  Location: [facility] ORS;   Service: Gynecology;  Laterality: N/A;  . KNEE SURGERY Bilateral   . KNEE SURGERY     right and left ACL  . TUBAL LIGATION  1997    Medical History: Past Medical History:  Diagnosis Date  . Arthritis   . Blood transfusion without reported diagnosis   . Chickenpox   . Depression   . Frequent headaches   . Headache   . History of blood transfusion   . Hyperlipidemia   . Hypertension   . Iron deficiency anemia due to chronic blood loss 07/14/2018  . Sleep apnea    CPAP ordered but can't tolerate it    Family History: Family History  Problem Relation Age of Onset  . Hypertension Father   . Pancreatic cancer Father   . CAD Mother   . Diabetes Mother   . Cancer Paternal Grandmother        unknown  . Cancer Paternal Grandfather        unknown  . Breast cancer Neg Hx     Social History: Social History   Socioeconomic History  . Marital status: Single    Spouse name: Not on file  . Number of children: Not on file  . Years of education: Not on file  . Highest education level: Not on file  Occupational History  . Not on file  Tobacco Use  . Smoking status: Never Smoker  . Smokeless tobacco: Never Used  Vaping Use  . Vaping Use: Never used  Substance and Sexual Activity  . Alcohol use: Yes    Alcohol/week: 1.0 standard drink    Types: 1 Cans of beer per week    Comment: occational  . Drug use: No  . Sexual activity: Not Currently    Birth control/protection: Surgical    Comment: Tubal ligation   Other Topics Concern  . Not on file  Social History Narrative  . Not on file   Social Determinants of Health   Financial Resource Strain:   . Difficulty of Paying Living Expenses: Not on file  Food Insecurity:   . Worried About Programme researcher, broadcasting/film/video in the Last Year: Not on file  . Ran Out of Food in the Last Year: Not on file  Transportation Needs:   . Lack of Transportation (Medical): Not on file  . Lack of Transportation (Non-Medical): Not on file  Physical  Activity:   . Days of Exercise per Week: Not on file  . Minutes of Exercise per Session: Not on file  Stress:   . Feeling of Stress : Not on file  Social Connections:   . Frequency of Communication with Friends and Family: Not on file  . Frequency of Social Gatherings with Friends and Family: Not on file  . Attends Religious Services: Not on file  . Active Member of Clubs or Organizations: Not on file  . Attends Banker Meetings: Not on file  . Marital Status: Not on file  Intimate Partner Violence:   . Fear of Current or Ex-Partner: Not on file  . Emotionally Abused: Not on file  . Physically Abused: Not on file  . Sexually Abused: Not on  file    Vital Signs: Blood pressure (!) 180/110, pulse 93, temperature (!) 97.3 F (36.3 C), resp. rate 16, height 5\' 4"  (1.626 m), weight 238 lb 3.2 oz (108 kg), SpO2 97 %.  Examination: General Appearance: The patient is well-developed, well-nourished, and in no distress. Skin: Gross inspection of skin unremarkable. Head: normocephalic, no gross deformities. Eyes: no gross deformities noted. ENT: ears appear grossly normal no exudates. Neck: Supple. No thyromegaly. No LAD. Respiratory: few rhonchi are noted. Cardiovascular: Normal S1 and S2 without murmur or rub. Extremities: No cyanosis. pulses are equal. Neurologic: Alert and oriented. No involuntary movements.  LABS: Recent Results (from the past 2160 hour(s))  CBC     Status: Abnormal   Collection Time: 06/30/19  7:12 PM  Result Value Ref Range   WBC 10.7 (H) 4.0 - 10.5 K/uL   RBC 4.38 3.87 - 5.11 MIL/uL   Hemoglobin 10.2 (L) 12.0 - 15.0 g/dL   HCT 95.2 (L) 36 - 46 %   MCV 79.7 (L) 80.0 - 100.0 fL   MCH 23.3 (L) 26.0 - 34.0 pg   MCHC 29.2 (L) 30.0 - 36.0 g/dL   RDW 84.1 (H) 32.4 - 40.1 %   Platelets 307 150 - 400 K/uL   nRBC 0.2 0.0 - 0.2 %    Comment: Performed at [facility], [address] [place], [place] [address]  Comprehensive metabolic panel      Status: Abnormal   Collection Time: 06/30/19  7:12 PM  Result Value Ref Range   Sodium 138 135 - 145 mmol/L   Potassium 4.2 3.5 - 5.1 mmol/L   Chloride 105 98 - 111 mmol/L   CO2 25 22 - 32 mmol/L   Glucose, Bld 102 (H) 70 - 99 mg/dL    Comment: Glucose reference range applies only to samples taken after fasting for at least 8 hours.   BUN 20 6 - 20 mg/dL   Creatinine, Ser 3.66 (H) 0.44 - 1.00 mg/dL   Calcium 9.3 8.9 - 44.0 mg/dL   Total Protein 7.2 6.5 - 8.1 g/dL   Albumin 3.7 3.5 - 5.0 g/dL   AST 29 15 - 41 U/L   ALT 28 0 - 44 U/L   Alkaline Phosphatase 78 38 - 126 U/L   Total Bilirubin 0.5 0.3 - 1.2 mg/dL   GFR calc non Af Amer 58 (L) >60 mL/min   GFR calc Af Amer >60 >60 mL/min   Anion gap 8 5 - 15    Comment: Performed at [facility], [address] [place], [place] [address]  Troponin I (High Sensitivity)     Status: None   Collection Time: 06/30/19  7:12 PM  Result Value Ref Range   Troponin I (High Sensitivity) 10 <18 ng/L    Comment: (NOTE) Elevated high sensitivity troponin I (hsTnI) values and significant  changes across serial measurements may suggest ACS but many other  chronic and acute conditions are known to elevate hsTnI results.  Refer to the "Links" section for chest pain algorithms and additional  guidance. Performed at [facility], [address] [place], [place] [address]   Troponin I (High Sensitivity)     Status: None   Collection Time: 06/30/19 11:59 PM  Result Value Ref Range   Troponin I (High Sensitivity) 11 <18 ng/L    Comment: (NOTE) Elevated high sensitivity troponin I (hsTnI) values and significant  changes across serial measurements may suggest ACS but many other  chronic and  acute conditions are known to elevate hsTnI results.  Refer to the "Links" section for chest pain algorithms and additional  guidance. Performed at [facility], [address] [place], [place] [address]   Basic metabolic panel      Status: Abnormal   Collection Time: 08/18/19  3:43 PM  Result Value Ref Range   Sodium 135 135 - 145 mmol/L   Potassium 3.9 3.5 - 5.1 mmol/L   Chloride 106 98 - 111 mmol/L   CO2 22 22 - 32 mmol/L   Glucose, Bld 114 (H) 70 - 99 mg/dL    Comment: Glucose reference range applies only to samples taken after fasting for at least 8 hours.   BUN 13 6 - 20 mg/dL   Creatinine, Ser 1.911.01 (H) 0.44 - 1.00 mg/dL   Calcium 8.4 (L) 8.9 - 10.3 mg/dL   GFR calc non Af Amer >60 >60 mL/min   GFR calc Af Amer >60 >60 mL/min   Anion gap 7 5 - 15    Comment: Performed at [facility], [address] [place], [place] [address]  CBC     Status: Abnormal   Collection Time: 08/18/19  3:43 PM  Result Value Ref Range   WBC 9.0 4.0 - 10.5 K/uL   RBC 4.33 3.87 - 5.11 MIL/uL   Hemoglobin 8.8 (L) 12.0 - 15.0 g/dL    Comment: Reticulocyte Hemoglobin testing may be clinically indicated, consider ordering this additional test [id number]    HCT 31.4 (L) 36 - 46 %   MCV 72.5 (L) 80.0 - 100.0 fL   MCH 20.3 (L) 26.0 - 34.0 pg   MCHC 28.0 (L) 30.0 - 36.0 g/dL   RDW 57.819.0 (H) 46.911.5 - 62.915.5 %   Platelets 295 150 - 400 K/uL   nRBC 1.2 (H) 0.0 - 0.2 %    Comment: Performed at [facility], [address] [place], [place] [address]  Troponin I (High Sensitivity)     Status: Abnormal   Collection Time: 08/18/19  3:52 PM  Result Value Ref Range   Troponin I (High Sensitivity) 42 (H) <18 ng/L    Comment: (NOTE) Elevated high sensitivity troponin I (hsTnI) values and significant  changes across serial measurements may suggest ACS but many other  chronic and acute conditions are known to elevate hsTnI results.  Refer to the "Links" section for chest pain algorithms and additional  guidance. Performed at [facility], [address] [place], [place] [address]   SARS Coronavirus 2 by RT PCR (hospital order, performed in [facility] hospital lab) Nasopharyngeal Nasopharyngeal Swab      Status: None   Collection Time: 08/18/19  5:47 PM   Specimen: Nasopharyngeal Swab  Result Value Ref Range   SARS Coronavirus 2 NEGATIVE NEGATIVE    Comment: (NOTE) SARS-CoV-2 target nucleic acids are NOT DETECTED.  The SARS-CoV-2 RNA is generally detectable in upper and lower respiratory specimens during the acute phase of infection. The lowest concentration of SARS-CoV-2 viral copies this assay can detect is 250 copies / mL. A negative result does not preclude SARS-CoV-2 infection and should not be used as the sole basis for treatment or other patient management decisions.  A negative result may occur with improper specimen collection / handling, submission of specimen other than nasopharyngeal swab, presence of viral mutation(s) within the areas targeted by this assay, and inadequate number of viral copies (<250 copies / mL). A negative result must be combined with clinical observations, patient  history, and epidemiological information.  Fact Sheet for Patients:   BoilerBrush.com.cy  Fact Sheet for Healthcare Providers: https://pope.com/  This test is not yet approved or  cleared by the [place] FDA and has been authorized for detection and/or diagnosis of SARS-CoV-2 by FDA under an Emergency Use Authorization (EUA).  This EUA will remain in effect (meaning this test can be used) for the duration of the COVID-19 declaration under Section 564(b)(1) of the Act, 21 U.S.C. section 360bbb-3(b)(1), unless the authorization is terminated or revoked sooner.  Performed at [facility], [address] [place], [place] [address]   HIV Antibody (routine testing w rflx)     Status: None   Collection Time: 08/18/19 10:10 PM  Result Value Ref Range   HIV Screen 4th Generation wRfx Non Reactive Non Reactive    Comment: Performed at [facility], [address] [place], [place] [address]  TSH     Status: None    Collection Time: 08/18/19 10:10 PM  Result Value Ref Range   TSH 1.031 0.350 - 4.500 uIU/mL    Comment: Performed by a 3rd Generation assay with a functional sensitivity of <=0.01 uIU/mL. Performed at [facility], [address] [place], [place] [address]   T4, free     Status: None   Collection Time: 08/18/19 10:10 PM  Result Value Ref Range   Free T4 0.80 0.61 - 1.12 ng/dL    Comment: (NOTE) Biotin ingestion may interfere with free T4 tests. If the results are inconsistent with the TSH level, previous test results, or the clinical presentation, then consider biotin interference. If needed, order repeat testing after stopping biotin. Performed at [facility], [address] [place], [place] [address]   Hemoglobin A1c     Status: Abnormal   Collection Time: 08/18/19 10:10 PM  Result Value Ref Range   Hgb A1c MFr Bld 6.2 (H) 4.8 - 5.6 %    Comment: (NOTE) Pre diabetes:          5.7%-6.4%  Diabetes:              >6.4%  Glycemic control for   <7.0% adults with diabetes    Mean Plasma Glucose 131.24 mg/dL    Comment: Performed at [facility], [address], [place], [place] [address]  Brain natriuretic peptide     Status: Abnormal   Collection Time: 08/18/19 10:10 PM  Result Value Ref Range   B Natriuretic Peptide 170.1 (H) 0.0 - 100.0 pg/mL    Comment: Performed at [facility], [address] [place], [place] [address]  Troponin I (High Sensitivity)     Status: Abnormal   Collection Time: 08/18/19 10:10 PM  Result Value Ref Range   Troponin I (High Sensitivity) 1,002 (HH) <18 ng/L    Comment: CRITICAL RESULT CALLED TO, READ BACK BY AND VERIFIED WITH REBEKAH BAXTER ON 08/18/19 AT 2319 BY JAG (NOTE) Elevated high sensitivity troponin I (hsTnI) values and significant  changes across serial measurements may suggest ACS but many other  chronic and acute conditions are known to elevate hsTnI results.  Refer to the "Links" section for  chest pain algorithms and additional  guidance. Performed at [facility], [address] [place], [place] [address]   Iron and TIBC     Status: Abnormal   Collection Time: 08/18/19 10:10 PM  Result Value Ref Range   Iron 16 (L) 28 - 170 ug/dL   TIBC 962  250 - 450 ug/dL   Saturation Ratios 4 (L) 10.4 - 31.8 %   UIBC 404 ug/dL    Comment: Performed at [facility], [address] [place], [place] [address]  Ferritin     Status: Abnormal   Collection Time: 08/18/19 10:10 PM  Result Value Ref Range   Ferritin 5 (L) 11 - 307 ng/mL    Comment: Performed at [facility], [address] [place], [place] [address]  Troponin I (High Sensitivity)     Status: Abnormal   Collection Time: 08/19/19 12:28 AM  Result Value Ref Range   Troponin I (High Sensitivity) 1,144 (HH) <18 ng/L    Comment: CRITICAL VALUE NOTED. VALUE IS CONSISTENT WITH PREVIOUSLY REPORTED/CALLED VALUE / JAG (NOTE) Elevated high sensitivity troponin I (hsTnI) values and significant  changes across serial measurements may suggest ACS but many other  chronic and acute conditions are known to elevate hsTnI results.  Refer to the "Links" section for chest pain algorithms and additional  guidance. Performed at [facility], [address] [place], [place] [address]   Heparin level (unfractionated)     Status: Abnormal   Collection Time: 08/19/19 12:28 AM  Result Value Ref Range   Heparin Unfractionated 1.34 (H) 0.30 - 0.70 IU/mL    Comment: (NOTE) If heparin results are below expected values, and patient dosage has  been confirmed, suggest follow up testing of antithrombin III levels. Performed at [facility], [address] [place], [place] [address]   APTT     Status: None   Collection Time: 08/19/19 12:28 AM  Result Value Ref Range   aPTT 29 24 - 36 seconds    Comment: Performed at [facility], [address] [place], [place] [address]  Protime-INR      Status: None   Collection Time: 08/19/19 12:28 AM  Result Value Ref Range   Prothrombin Time 14.6 11.4 - 15.2 seconds   INR 1.2 0.8 - 1.2    Comment: (NOTE) INR goal varies based on device and disease states. Performed at [facility], [address] [place], [place] [address]   Lipid panel     Status: Abnormal   Collection Time: 08/19/19  7:21 AM  Result Value Ref Range   Cholesterol 212 (H) 0 - 200 mg/dL   Triglycerides 578 (H) <150 mg/dL   HDL 52 >46 mg/dL   Total CHOL/HDL Ratio 4.1 RATIO   VLDL 41 (H) 0 - 40 mg/dL   LDL Cholesterol 962 (H) 0 - 99 mg/dL    Comment:        Total Cholesterol/HDL:CHD Risk Coronary Heart Disease Risk Table                     Men   Women  1/2 Average Risk   3.4   3.3  Average Risk       5.0   4.4  2 X Average Risk   9.6   7.1  3 X Average Risk  23.4   11.0        Use the calculated Patient Ratio above and the CHD Risk Table to determine the patient's CHD Risk.        ATP III CLASSIFICATION (LDL):  <100     mg/dL   Optimal  952-841  mg/dL   Near or Above                    Optimal  130-159  mg/dL   Borderline  628-366  mg/dL   High  >294     mg/dL   Very High Performed at [facility], [address] [place], [place] [address]   CBC     Status: Abnormal   Collection Time: 08/19/19  7:21 AM  Result Value Ref Range   WBC 9.0 4.0 - 10.5 K/uL   RBC 4.11 3.87 - 5.11 MIL/uL   Hemoglobin 8.5 (L) 12.0 - 15.0 g/dL    Comment: Reticulocyte Hemoglobin testing may be clinically indicated, consider ordering this additional test [id number]    HCT 29.4 (L) 36 - 46 %   MCV 71.5 (L) 80.0 - 100.0 fL   MCH 20.7 (L) 26.0 - 34.0 pg   MCHC 28.9 (L) 30.0 - 36.0 g/dL   RDW 81.2 (H) 75.1 - 70.0 %   Platelets 326 150 - 400 K/uL   nRBC 1.6 (H) 0.0 - 0.2 %    Comment: Performed at [facility], [address] [place], [place] [address]  Basic metabolic panel     Status: Abnormal   Collection Time: 08/19/19  7:21 AM   Result Value Ref Range   Sodium 134 (L) 135 - 145 mmol/L   Potassium 3.7 3.5 - 5.1 mmol/L   Chloride 102 98 - 111 mmol/L   CO2 25 22 - 32 mmol/L   Glucose, Bld 139 (H) 70 - 99 mg/dL    Comment: Glucose reference range applies only to samples taken after fasting for at least 8 hours.   BUN 13 6 - 20 mg/dL   Creatinine, Ser 4.96 0.44 - 1.00 mg/dL   Calcium 8.9 8.9 - 75.9 mg/dL   GFR calc non Af Amer >60 >60 mL/min   GFR calc Af Amer >60 >60 mL/min   Anion gap 7 5 - 15    Comment: Performed at [facility], [address] [place], [place] [address]  Heparin level (unfractionated)     Status: Abnormal   Collection Time: 08/19/19  7:21 AM  Result Value Ref Range   Heparin Unfractionated >3.60 (H) 0.30 - 0.70 IU/mL    Comment: RESULTS CONFIRMED BY MANUAL DILUTION (NOTE) If heparin results are below expected values, and patient dosage has  been confirmed, suggest follow up testing of antithrombin III levels. Performed at [facility], [address] [place], [place] [address]   APTT     Status: Abnormal   Collection Time: 08/19/19  7:21 AM  Result Value Ref Range   aPTT 83 (H) 24 - 36 seconds    Comment:        IF BASELINE aPTT IS ELEVATED, SUGGEST PATIENT RISK ASSESSMENT BE USED TO DETERMINE APPROPRIATE ANTICOAGULANT THERAPY. Performed at [facility], [address] [place], [place] [address]   ECHOCARDIOGRAM COMPLETE     Status: None   Collection Time: 08/19/19 10:04 AM  Result Value Ref Range   Weight 3,520.31 oz   Height 64 in   BP 132/78 mmHg   Ao pk vel 1.62 m/s   AV Area VTI 2.50 cm2   AR max vel 2.56 cm2   AV Mean grad 6.0 mmHg   AV Peak grad 10.5 mmHg   S' Lateral 2.28 cm   AV Area mean vel 2.67 cm2   Area-P 1/2 3.06 cm2  APTT     Status: Abnormal   Collection Time: 08/19/19  2:35 PM  Result Value Ref Range   aPTT 85 (H) 24 -  36 seconds    Comment:        IF BASELINE aPTT IS ELEVATED, SUGGEST PATIENT RISK ASSESSMENT BE  USED TO DETERMINE APPROPRIATE ANTICOAGULANT THERAPY. Performed at [facility], [address] [place], [place] [address]   CBC     Status: Abnormal   Collection Time: 08/20/19  6:25 AM  Result Value Ref Range   WBC 10.0 4.0 - 10.5 K/uL   RBC 4.06 3.87 - 5.11 MIL/uL   Hemoglobin 8.1 (L) 12.0 - 15.0 g/dL    Comment: Reticulocyte Hemoglobin testing may be clinically indicated, consider ordering this additional test [id number]    HCT 30.3 (L) 36 - 46 %   MCV 74.6 (L) 80.0 - 100.0 fL   MCH 20.0 (L) 26.0 - 34.0 pg   MCHC 26.7 (L) 30.0 - 36.0 g/dL   RDW 11.9 (H) 14.7 - 82.9 %   Platelets 290 150 - 400 K/uL   nRBC 1.4 (H) 0.0 - 0.2 %    Comment: Performed at [facility], [address] [place], [place] [address]  Basic metabolic panel     Status: Abnormal   Collection Time: 08/20/19  6:25 AM  Result Value Ref Range   Sodium 138 135 - 145 mmol/L   Potassium 3.6 3.5 - 5.1 mmol/L   Chloride 105 98 - 111 mmol/L   CO2 24 22 - 32 mmol/L   Glucose, Bld 115 (H) 70 - 99 mg/dL    Comment: Glucose reference range applies only to samples taken after fasting for at least 8 hours.   BUN 14 6 - 20 mg/dL   Creatinine, Ser 0.86 0.44 - 1.00 mg/dL   Calcium 8.9 8.9 - 57.8 mg/dL   GFR calc non Af Amer >60 >60 mL/min   GFR calc Af Amer >60 >60 mL/min   Anion gap 9 5 - 15    Comment: Performed at [facility], [address] [place], [place] [address]  APTT     Status: Abnormal   Collection Time: 08/20/19  6:25 AM  Result Value Ref Range   aPTT 48 (H) 24 - 36 seconds    Comment:        IF BASELINE aPTT IS ELEVATED, SUGGEST PATIENT RISK ASSESSMENT BE USED TO DETERMINE APPROPRIATE ANTICOAGULANT THERAPY. Performed at [facility], [address] [place], [place] [address]   Heparin level (unfractionated)     Status: Abnormal   Collection Time: 08/20/19  6:25 AM  Result Value Ref Range   Heparin Unfractionated 0.76 (H) 0.30 - 0.70 IU/mL    Comment:  (NOTE) If heparin results are below expected values, and patient dosage has  been confirmed, suggest follow up testing of antithrombin III levels. Performed at [facility], [address] [place], [place] [address]   Vitamin B12     Status: None   Collection Time: 09/05/19 11:09 AM  Result Value Ref Range   Vitamin B-12 440 180 - 914 pg/mL    Comment: (NOTE) This assay is not validated for testing neonatal or myeloproliferative syndrome specimens for Vitamin B12 levels. Performed at [facility], [address] [place], [place] [address]   Hepatic function panel     Status: Abnormal   Collection Time: 09/05/19 11:09 AM  Result Value Ref Range   Total Protein 7.4 6.5 - 8.1 g/dL   Albumin 3.6 3.5 - 5.0 g/dL   AST 25 15 - 41 U/L   ALT 31 0 - 44  U/L   Alkaline Phosphatase 85 38 - 126 U/L   Total Bilirubin 0.3 0.3 - 1.2 mg/dL   Bilirubin, Direct 0.1 0.0 - 0.2 mg/dL   Indirect Bilirubin 0.2 (L) 0.3 - 0.9 mg/dL    Comment: Performed at [facility], [address] [place], [place] [address]    Radiology: ECHOCARDIOGRAM COMPLETE  Result Date: 08/19/2019    ECHOCARDIOGRAM REPORT   Patient Name:   Madison Lewis Date of Exam: 08/19/2019 Medical Rec #:  [id number]        Height:       64.0 in Accession #:    [id number]       Weight:       220.0 lb Date of Birth:  October 01, 1970        BSA:          2.037 m Patient Age:    48 years         BP:           132/78 mmHg Patient Gender: F                HR:           80 bpm. Exam Location:  [facility] Procedure: 2D Echo, Cardiac Doppler and Color Doppler Indications:     Atrial Fibrillation 427.31  History:         Patient has no prior history of Echocardiogram examinations.                  Risk Factors:Hypertension and Sleep Apnea.  Sonographer:     Cristela Blue RDCS (AE) Referring Phys:  [id number] Dalia Heading Diagnosing Phys: Harold Hedge MD IMPRESSIONS  1. Left ventricular ejection fraction, by estimation, is 60 to 65%. The left  ventricle has normal function. The left ventricle has no regional wall motion abnormalities. There is mild left ventricular hypertrophy. Left ventricular diastolic parameters are consistent with Grade I diastolic dysfunction (impaired relaxation).  2. Right ventricular systolic function is normal. The right ventricular size is normal. There is normal pulmonary artery systolic pressure.  3. The mitral valve is normal in structure. Trivial mitral valve regurgitation.  4. The aortic valve was not well visualized. Aortic valve regurgitation is not visualized. FINDINGS  Left Ventricle: Left ventricular ejection fraction, by estimation, is 60 to 65%. The left ventricle has normal function. The left ventricle has no regional wall motion abnormalities. The left ventricular internal cavity size was normal in size. There is  mild left ventricular hypertrophy. Left ventricular diastolic parameters are consistent with Grade I diastolic dysfunction (impaired relaxation). Right Ventricle: The right ventricular size is normal. No increase in right ventricular wall thickness. Right ventricular systolic function is normal. There is normal pulmonary artery systolic pressure. The tricuspid regurgitant velocity is 1.78 m/s, and  with an assumed right atrial pressure of 10 mmHg, the estimated right ventricular systolic pressure is 22.7 mmHg. Left Atrium: Left atrial size was normal in size. Right Atrium: Right atrial size was normal in size. Pericardium: There is no evidence of pericardial effusion. Mitral Valve: The mitral valve is normal in structure. Trivial mitral valve regurgitation. Tricuspid Valve: The tricuspid valve is not well visualized. Tricuspid valve regurgitation is not demonstrated. Aortic Valve: The aortic valve was not well visualized. Aortic valve regurgitation is not visualized. Aortic valve mean gradient measures 6.0 mmHg. Aortic valve peak gradient measures 10.5 mmHg. Aortic valve area, by VTI measures 2.50 cm.  Pulmonic Valve: The pulmonic valve was not well visualized.  Pulmonic valve regurgitation is trivial. Aorta: The aortic root is normal in size and structure. IAS/Shunts: The interatrial septum was not well visualized.  LEFT VENTRICLE PLAX 2D LVIDd:         3.51 cm  Diastology LVIDs:         2.28 cm  LV e' lateral:   3.92 cm/s LV PW:         1.32 cm  LV E/e' lateral: 15.5 LV IVS:        1.37 cm  LV e' medial:    2.50 cm/s LVOT diam:     2.00 cm  LV E/e' medial:  24.2 LV SV:         70 LV SV Index:   34 LVOT Area:     3.14 cm  RIGHT VENTRICLE RV S prime:     14.90 cm/s TAPSE (M-mode): 4.2 cm LEFT ATRIUM             Index       RIGHT ATRIUM           Index LA diam:        2.50 cm 1.23 cm/m  RA Area:     21.50 cm LA Vol (A2C):   68.9 ml 33.82 ml/m RA Volume:   67.20 ml  32.98 ml/m LA Vol (A4C):   87.3 ml 42.85 ml/m LA Biplane Vol: 77.8 ml 38.19 ml/m  AORTIC VALVE                    PULMONIC VALVE AV Area (Vmax):    2.56 cm     PV Vmax:        1.02 m/s AV Area (Vmean):   2.67 cm     PV Peak grad:   4.2 mmHg AV Area (VTI):     2.50 cm     RVOT Peak grad: 7 mmHg AV Vmax:           162.00 cm/s AV Vmean:          111.500 cm/s AV VTI:            0.280 m AV Peak Grad:      10.5 mmHg AV Mean Grad:      6.0 mmHg LVOT Vmax:         132.00 cm/s LVOT Vmean:        94.800 cm/s LVOT VTI:          0.223 m LVOT/AV VTI ratio: 0.80  AORTA Ao Root diam: 2.70 cm MITRAL VALVE               TRICUSPID VALVE MV Area (PHT): 3.06 cm    TR Peak grad:   12.7 mmHg MV Decel Time: 248 msec    TR Vmax:        178.00 cm/s MV E velocity: 60.60 cm/s MV A velocity: 75.70 cm/s  SHUNTS MV E/A ratio:  0.80        Systemic VTI:  0.22 m                            Systemic Diam: 2.00 cm Harold Hedge MD Electronically signed by Harold Hedge MD Signature Date/Time: 08/19/2019/12:27:46 PM    Final     No results found.  DG Chest Portable 1 View  Result Date: 08/18/2019 CLINICAL DATA:  Chest pain and shortness of breath. EXAM: PORTABLE CHEST 1 VIEW  COMPARISON:  June 30, 2019  FINDINGS: There is no evidence of acute infiltrate, pleural effusion or pneumothorax. The heart size and mediastinal contours are within normal limits. There is a moderate-sized hiatal hernia. The visualized skeletal structures are unremarkable. IMPRESSION: 1. No active disease. 2. Moderate-sized hiatal hernia. Electronically Signed   By: Aram Candela M.D.   On: 08/18/2019 16:14   ECHOCARDIOGRAM COMPLETE  Result Date: 08/19/2019    ECHOCARDIOGRAM REPORT   Patient Name:   Madison Lewis Date of Exam: 08/19/2019 Medical Rec #:  [id number]        Height:       64.0 in Accession #:    [id number]       Weight:       220.0 lb Date of Birth:  [birth year]        BSA:          2.037 m Patient Age:    48 years         BP:           132/78 mmHg Patient Gender: F                HR:           80 bpm. Exam Location:  [facility] Procedure: 2D Echo, Cardiac Doppler and Color Doppler Indications:     Atrial Fibrillation 427.31  History:         Patient has no prior history of Echocardiogram examinations.                  Risk Factors:Hypertension and Sleep Apnea.  Sonographer:     Cristela Blue RDCS (AE) Referring Phys:  [id number] Dalia Heading Diagnosing Phys: Harold Hedge MD IMPRESSIONS  1. Left ventricular ejection fraction, by estimation, is 60 to 65%. The left ventricle has normal function. The left ventricle has no regional wall motion abnormalities. There is mild left ventricular hypertrophy. Left ventricular diastolic parameters are consistent with Grade I diastolic dysfunction (impaired relaxation).  2. Right ventricular systolic function is normal. The right ventricular size is normal. There is normal pulmonary artery systolic pressure.  3. The mitral valve is normal in structure. Trivial mitral valve regurgitation.  4. The aortic valve was not well visualized. Aortic valve regurgitation is not visualized. FINDINGS  Left Ventricle: Left ventricular ejection fraction, by estimation, is 60 to 65%. The  left ventricle has normal function. The left ventricle has no regional wall motion abnormalities. The left ventricular internal cavity size was normal in size. There is  mild left ventricular hypertrophy. Left ventricular diastolic parameters are consistent with Grade I diastolic dysfunction (impaired relaxation). Right Ventricle: The right ventricular size is normal. No increase in right ventricular wall thickness. Right ventricular systolic function is normal. There is normal pulmonary artery systolic pressure. The tricuspid regurgitant velocity is 1.78 m/s, and  with an assumed right atrial pressure of 10 mmHg, the estimated right ventricular systolic pressure is 22.7 mmHg. Left Atrium: Left atrial size was normal in size. Right Atrium: Right atrial size was normal in size. Pericardium: There is no evidence of pericardial effusion. Mitral Valve: The mitral valve is normal in structure. Trivial mitral valve regurgitation. Tricuspid Valve: The tricuspid valve is not well visualized. Tricuspid valve regurgitation is not demonstrated. Aortic Valve: The aortic valve was not well visualized. Aortic valve regurgitation is not visualized. Aortic valve mean gradient measures 6.0 mmHg. Aortic valve peak gradient measures 10.5 mmHg. Aortic valve area, by VTI measures 2.50 cm. Pulmonic Valve: The pulmonic valve was not well visualized.  Pulmonic valve regurgitation is trivial. Aorta: The aortic root is normal in size and structure. IAS/Shunts: The interatrial septum was not well visualized.  LEFT VENTRICLE PLAX 2D LVIDd:         3.51 cm  Diastology LVIDs:         2.28 cm  LV e' lateral:   3.92 cm/s LV PW:         1.32 cm  LV E/e' lateral: 15.5 LV IVS:        1.37 cm  LV e' medial:    2.50 cm/s LVOT diam:     2.00 cm  LV E/e' medial:  24.2 LV SV:         70 LV SV Index:   34 LVOT Area:     3.14 cm  RIGHT VENTRICLE RV S prime:     14.90 cm/s TAPSE (M-mode): 4.2 cm LEFT ATRIUM             Index       RIGHT ATRIUM            Index LA diam:        2.50 cm 1.23 cm/m  RA Area:     21.50 cm LA Vol (A2C):   68.9 ml 33.82 ml/m RA Volume:   67.20 ml  32.98 ml/m LA Vol (A4C):   87.3 ml 42.85 ml/m LA Biplane Vol: 77.8 ml 38.19 ml/m  AORTIC VALVE                    PULMONIC VALVE AV Area (Vmax):    2.56 cm     PV Vmax:        1.02 m/s AV Area (Vmean):   2.67 cm     PV Peak grad:   4.2 mmHg AV Area (VTI):     2.50 cm     RVOT Peak grad: 7 mmHg AV Vmax:           162.00 cm/s AV Vmean:          111.500 cm/s AV VTI:            0.280 m AV Peak Grad:      10.5 mmHg AV Mean Grad:      6.0 mmHg LVOT Vmax:         132.00 cm/s LVOT Vmean:        94.800 cm/s LVOT VTI:          0.223 m LVOT/AV VTI ratio: 0.80  AORTA Ao Root diam: 2.70 cm MITRAL VALVE               TRICUSPID VALVE MV Area (PHT): 3.06 cm    TR Peak grad:   12.7 mmHg MV Decel Time: 248 msec    TR Vmax:        178.00 cm/s MV E velocity: 60.60 cm/s MV A velocity: 75.70 cm/s  SHUNTS MV E/A ratio:  0.80        Systemic VTI:  0.22 m                            Systemic Diam: 2.00 cm Harold Hedge MD Electronically signed by Harold Hedge MD Signature Date/Time: 08/19/2019/12:27:46 PM    Final       Assessment and Plan: Patient Active Problem List   Diagnosis Date Noted  . Atrial fibrillation (HCC) 08/19/2019  . Chest pain 08/18/2019  . Atrial fibrillation with RVR (HCC)  08/18/2019  . Iron deficiency anemia due to chronic blood loss 07/14/2018  . Anemia 07/06/2018  . Uncontrolled hypertension 06/14/2018  . Iron deficiency anemia   . Hematochezia   . Symptomatic anemia 05/17/2016  . Prediabetes 03/24/2015  . Chronic tension-type headache, not intractable 03/18/2015  . 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 Essential hypertension 03/18/2015    1. Hypersomnia and obstructive sleep apnea.  Patient will be scheduled for a in lab sleep study she has a history of atrial fibrillation and arrhythmias therefore this warrants an in lab sleep study to be done. 2. Uncontrolled  hypertension encouraged to follow-up with her primary care team for control of her blood pressure. 3. Atrial fibrillation she has had episodes and admissions where she has an exacerbation of her atrial fibrillation requiring hospitalization.  Recommend getting PSG in the sleep lab so that we can more closely monitor her rhythm during the nighttime. 4. Morbid obesity had a very lengthy discussion with her regarding her weight and the importance of weight loss through diet and exercise.  She is also going to continue to work with her primary care team for dietary management.  General Counseling: I have discussed the findings of the evaluation and examination with Hien.  I have also discussed any further diagnostic evaluation thatmay be needed or ordered today. Kiaira verbalizes understanding of the findings of todays visit. We also reviewed her medications today and discussed drug interactions and side effects including but not limited excessive drowsiness and altered mental states. We also discussed that there is always a risk not just to her but also people around her. she has been encouraged to call the office with any questions or concerns that should arise related to todays visit.  Orders Placed This Encounter  Procedures  . PSG Sleep Study    Standing Status:   Future    Standing Expiration Date:   09/08/2020    Order Specific Question:   Where should this test be performed:    Answer:   [facility]     Time spent: 73  I have personally obtained a history, examined the patient, evaluated laboratory and imaging results, formulated the assessment and plan and placed orders.    Yevonne Pax, MD [facility] Sleep medicine

## 2019-09-09 NOTE — Patient Instructions (Signed)

## 2019-09-11 ENCOUNTER — Other Ambulatory Visit: Payer: Self-pay

## 2019-09-11 ENCOUNTER — Inpatient Hospital Stay: Payer: BC Managed Care – PPO

## 2019-09-11 NOTE — Progress Notes (Signed)
Unable to obtain IV access. Patient stuck twice. Patient did not want to be stuck again. Will return next week for her scheduled appointment. Patient verbalized understanding to drink lots of water prior to appointment.

## 2019-09-16 ENCOUNTER — Other Ambulatory Visit: Payer: Self-pay | Admitting: Internal Medicine

## 2019-09-18 ENCOUNTER — Other Ambulatory Visit: Payer: Self-pay

## 2019-09-18 ENCOUNTER — Inpatient Hospital Stay: Payer: BC Managed Care – PPO | Attending: Oncology

## 2019-09-18 VITALS — BP 149/78 | HR 88 | Temp 98.3°F | Resp 20

## 2019-09-18 DIAGNOSIS — D500 Iron deficiency anemia secondary to blood loss (chronic): Secondary | ICD-10-CM | POA: Insufficient documentation

## 2019-09-18 DIAGNOSIS — N920 Excessive and frequent menstruation with regular cycle: Secondary | ICD-10-CM | POA: Insufficient documentation

## 2019-09-18 MED ORDER — SODIUM CHLORIDE 0.9 % IV SOLN
Freq: Once | INTRAVENOUS | Status: AC
  Filled 2019-09-18: qty 250

## 2019-09-18 MED ORDER — IRON SUCROSE 20 MG/ML IV SOLN
200.0000 mg | Freq: Once | INTRAVENOUS | Status: AC
  Administered 2019-09-18: 200 mg via INTRAVENOUS
  Filled 2019-09-18: qty 10

## 2019-09-24 ENCOUNTER — Telehealth: Payer: Self-pay

## 2019-09-24 NOTE — Telephone Encounter (Signed)
Completed medical record request and faxed requested records to [facility] [phone]. Copy of records request held at front.

## 2019-09-25 ENCOUNTER — Ambulatory Visit: Payer: BC Managed Care – PPO | Admitting: Internal Medicine

## 2019-09-25 ENCOUNTER — Inpatient Hospital Stay: Payer: BC Managed Care – PPO

## 2019-09-25 ENCOUNTER — Other Ambulatory Visit: Payer: Self-pay

## 2019-09-25 VITALS — BP 152/94 | HR 75 | Resp 18

## 2019-09-25 DIAGNOSIS — D500 Iron deficiency anemia secondary to blood loss (chronic): Secondary | ICD-10-CM | POA: Diagnosis not present

## 2019-09-25 MED ORDER — SODIUM CHLORIDE 0.9 % IV SOLN
INTRAVENOUS | Status: DC
  Filled 2019-09-25: qty 250

## 2019-09-25 MED ORDER — IRON SUCROSE 20 MG/ML IV SOLN
200.0000 mg | Freq: Once | INTRAVENOUS | Status: AC
  Administered 2019-09-25: 200 mg via INTRAVENOUS
  Filled 2019-09-25: qty 10

## 2019-10-02 ENCOUNTER — Inpatient Hospital Stay: Payer: BC Managed Care – PPO

## 2019-10-02 ENCOUNTER — Other Ambulatory Visit: Payer: Self-pay

## 2019-10-02 VITALS — BP 153/90 | HR 77 | Temp 98.0°F

## 2019-10-02 DIAGNOSIS — D500 Iron deficiency anemia secondary to blood loss (chronic): Secondary | ICD-10-CM

## 2019-10-02 MED ORDER — SODIUM CHLORIDE 0.9 % IV SOLN
Freq: Once | INTRAVENOUS | Status: AC
  Filled 2019-10-02: qty 250

## 2019-10-02 MED ORDER — IRON SUCROSE 20 MG/ML IV SOLN
200.0000 mg | Freq: Once | INTRAVENOUS | Status: AC
  Administered 2019-10-02: 200 mg via INTRAVENOUS
  Filled 2019-10-02: qty 10

## 2019-10-09 ENCOUNTER — Inpatient Hospital Stay: Payer: BC Managed Care – PPO

## 2019-10-09 ENCOUNTER — Ambulatory Visit: Payer: BC Managed Care – PPO | Admitting: Internal Medicine

## 2019-10-21 ENCOUNTER — Inpatient Hospital Stay: Payer: BC Managed Care – PPO | Attending: Oncology

## 2019-10-21 ENCOUNTER — Ambulatory Visit: Payer: BC Managed Care – PPO | Admitting: Oncology

## 2019-10-21 ENCOUNTER — Other Ambulatory Visit: Payer: Self-pay

## 2019-10-21 DIAGNOSIS — D500 Iron deficiency anemia secondary to blood loss (chronic): Secondary | ICD-10-CM

## 2019-10-21 DIAGNOSIS — D509 Iron deficiency anemia, unspecified: Secondary | ICD-10-CM | POA: Diagnosis present

## 2019-10-21 LAB — CBC WITH DIFFERENTIAL/PLATELET
Abs Immature Granulocytes: 0.04 10*3/uL (ref 0.00–0.07)
Basophils Absolute: 0.1 10*3/uL (ref 0.0–0.1)
Basophils Relative: 1 %
Eosinophils Absolute: 1 10*3/uL — ABNORMAL HIGH (ref 0.0–0.5)
Eosinophils Relative: 11 %
HCT: 34.7 % — ABNORMAL LOW (ref 36.0–46.0)
Hemoglobin: 10.3 g/dL — ABNORMAL LOW (ref 12.0–15.0)
Immature Granulocytes: 0 %
Lymphocytes Relative: 24 %
Lymphs Abs: 2.2 10*3/uL (ref 0.7–4.0)
MCH: 24.4 pg — ABNORMAL LOW (ref 26.0–34.0)
MCHC: 29.7 g/dL — ABNORMAL LOW (ref 30.0–36.0)
MCV: 82.2 fL (ref 80.0–100.0)
Monocytes Absolute: 0.6 10*3/uL (ref 0.1–1.0)
Monocytes Relative: 6 %
Neutro Abs: 5.2 10*3/uL (ref 1.7–7.7)
Neutrophils Relative %: 58 %
Platelets: 287 10*3/uL (ref 150–400)
RBC: 4.22 MIL/uL (ref 3.87–5.11)
RDW: 25.7 % — ABNORMAL HIGH (ref 11.5–15.5)
WBC: 9.1 10*3/uL (ref 4.0–10.5)
nRBC: 0 % (ref 0.0–0.2)

## 2019-10-21 LAB — IRON AND TIBC
Iron: 15 ug/dL — ABNORMAL LOW (ref 28–170)
Saturation Ratios: 4 % — ABNORMAL LOW (ref 10.4–31.8)
TIBC: 365 ug/dL (ref 250–450)
UIBC: 350 ug/dL

## 2019-10-21 LAB — FERRITIN: Ferritin: 23 ng/mL (ref 11–307)

## 2019-10-22 ENCOUNTER — Inpatient Hospital Stay: Payer: BC Managed Care – PPO

## 2019-10-22 ENCOUNTER — Encounter: Payer: Self-pay | Admitting: Oncology

## 2019-10-22 ENCOUNTER — Inpatient Hospital Stay (HOSPITAL_BASED_OUTPATIENT_CLINIC_OR_DEPARTMENT_OTHER): Payer: BC Managed Care – PPO | Admitting: Oncology

## 2019-10-22 VITALS — BP 146/91 | HR 90 | Temp 97.1°F | Resp 18 | Wt 242.3 lb

## 2019-10-22 VITALS — BP 160/90 | HR 84

## 2019-10-22 DIAGNOSIS — K921 Melena: Secondary | ICD-10-CM

## 2019-10-22 DIAGNOSIS — D500 Iron deficiency anemia secondary to blood loss (chronic): Secondary | ICD-10-CM | POA: Diagnosis not present

## 2019-10-22 DIAGNOSIS — D509 Iron deficiency anemia, unspecified: Secondary | ICD-10-CM | POA: Diagnosis not present

## 2019-10-22 MED ORDER — SODIUM CHLORIDE 0.9 % IV SOLN
Freq: Once | INTRAVENOUS | Status: AC
  Filled 2019-10-22: qty 250

## 2019-10-22 MED ORDER — IRON SUCROSE 20 MG/ML IV SOLN
200.0000 mg | Freq: Once | INTRAVENOUS | Status: AC
  Administered 2019-10-22: 200 mg via INTRAVENOUS
  Filled 2019-10-22: qty 10

## 2019-10-22 NOTE — Progress Notes (Signed)
Pt here for follow up. No new concerns voiced.   

## 2019-10-22 NOTE — Progress Notes (Signed)
Hematology/Oncology follow up  note [facility] [phone] [phone]   Patient Care Team: [facility] as PCP - General Rickard Patience, MD as Consulting Physician (Oncology)  REFERRING PROVIDER: Vivi Barrack*  CHIEF COMPLAINTS/REASON FOR VISIT:  Follow up for  anemia  HISTORY OF PRESENTING ILLNESS:  Madison Lewis is a  49 y.o.  female with PMH listed below was seen in consultation at the request of  Norma Fredrickson, Boykin Nearing, MD  for evaluation of anemia.   Reviewed patient's recent labs 07/06/2018 Labs revealed anemia with hemoglobin of 6.6, MCV 76.1.  Platelet count 522. Patient was advised by Dr. Norma Fredrickson to go to emergency room.  She had hospitalization from 07/05/2020 07/07/2018. She received 2 units of packed RBC transfusion, received IV iron during admission as well. Pelvic ultrasound showed mild endometrial thickening Anemia work-up consistent with iron deficiency anemia and low B12 level. She also received a B12 injection during admission.  Today patient reports feeling slightly better.  Fatigue has improved after recent blood transfusion and IV Venofer  x1. Patient craves for ice.  Associated signs and symptoms: Patient reports fatigue.  SOB with exertion.  Denies weight loss, easy bruising, hematochezia, hemoptysis, hematuria. Context: History of GI bleeding: Denies               History of Chronic kidney disease; CKD               History of autoimmune disease denies               History of hemolytic anemia.  Denies               Last colonoscopy: Colonoscopy and upper endoscopy 05/19/2016 showed nonbleeding internal hemorrhoids and hiatal hernia. No menses after endometrial ablation.  # hospitalized due to atrial fibrillation with RVR, she has a low chads score of 2 and due to her anemia, cardiology held anticoagulation. # Intermittently she has blood in the stool after eating spicy food, she  attributes to hemorrhoid bleeding. Last colonoscopy was done in 2018 by Dr. Servando Snare  INTERVAL HISTORY Madison Lewis is a 49 y.o. female who has above history reviewed by me today presents for follow up visit for management of anemia Problems and complaints are listed below: Patient has had IV Venofer treatments.  Improved fatigue.  Still low energy level   Review of Systems  Constitutional: Positive for fatigue. Negative for appetite change, chills and fever.  HENT:   Negative for hearing loss and voice change.   Eyes: Negative for eye problems.  Respiratory: Negative for chest tightness, cough and shortness of breath.   Cardiovascular: Negative for chest pain.  Gastrointestinal: Negative for abdominal distention, abdominal pain and blood in stool.  Endocrine: Negative for hot flashes.  Genitourinary: Negative for difficulty urinating and frequency.   Musculoskeletal: Negative for arthralgias.  Skin: Negative for itching and rash.  Neurological: Negative for extremity weakness.  Hematological: Negative for adenopathy.  Psychiatric/Behavioral: Negative for confusion.    MEDICAL HISTORY:  Past Medical History:  Diagnosis Date  . Arthritis   . Blood transfusion without reported diagnosis   . Chickenpox   . Depression   . Frequent headaches   . Headache   . History of blood transfusion   . Hyperlipidemia   . Hypertension   . Iron deficiency anemia due to chronic blood loss 07/14/2018  . Sleep apnea    CPAP ordered but can't tolerate it  SURGICAL HISTORY: Past Surgical History:  Procedure Laterality Date  . CESAREAN SECTION    . COLONOSCOPY WITH PROPOFOL N/A 05/19/2016   Procedure: COLONOSCOPY WITH PROPOFOL;  Surgeon: Midge Minium, MD;  Location: [facility] ENDOSCOPY;  Service: Endoscopy;  Laterality: N/A;  . ESOPHAGOGASTRODUODENOSCOPY (EGD) WITH PROPOFOL N/A 05/19/2016   Procedure: ESOPHAGOGASTRODUODENOSCOPY (EGD) WITH PROPOFOL;  Surgeon: Midge Minium, MD;  Location: [facility]  ENDOSCOPY;  Service: Endoscopy;  Laterality: N/A;  . HYSTEROSCOPY WITH D & C N/A 08/13/2018   Procedure: DILATATION AND CURETTAGE /HYSTEROSCOPY WITH ENDOMETRIAL ABLATION;  Surgeon: Natale Milch, MD;  Location: [facility] ORS;  Service: Gynecology;  Laterality: N/A;  . KNEE SURGERY Bilateral   . KNEE SURGERY     right and left ACL  . TUBAL LIGATION  1997    SOCIAL HISTORY: Social History   Socioeconomic History  . Marital status: Single    Spouse name: Not on file  . Number of children: Not on file  . Years of education: Not on file  . Highest education level: Not on file  Occupational History  . Not on file  Tobacco Use  . Smoking status: Never Smoker  . Smokeless tobacco: Never Used  Vaping Use  . Vaping Use: Never used  Substance and Sexual Activity  . Alcohol use: Yes    Alcohol/week: 1.0 standard drink    Types: 1 Cans of beer per week    Comment: occational  . Drug use: No  . Sexual activity: Not Currently    Birth control/protection: Surgical    Comment: Tubal ligation   Other Topics Concern  . Not on file  Social History Narrative  . Not on file   Social Determinants of Health   Financial Resource Strain:   . Difficulty of Paying Living Expenses: Not on file  Food Insecurity:   . Worried About Programme researcher, broadcasting/film/video in the Last Year: Not on file  . Ran Out of Food in the Last Year: Not on file  Transportation Needs:   . Lack of Transportation (Medical): Not on file  . Lack of Transportation (Non-Medical): Not on file  Physical Activity:   . Days of Exercise per Week: Not on file  . Minutes of Exercise per Session: Not on file  Stress:   . Feeling of Stress : Not on file  Social Connections:   . Frequency of Communication with Friends and Family: Not on file  . Frequency of Social Gatherings with Friends and Family: Not on file  . Attends Religious Services: Not on file  . Active Member of Clubs or Organizations: Not on file  . Attends Tax inspector Meetings: Not on file  . Marital Status: Not on file  Intimate Partner Violence:   . Fear of Current or Ex-Partner: Not on file  . Emotionally Abused: Not on file  . Physically Abused: Not on file  . Sexually Abused: Not on file    FAMILY HISTORY: Family History  Problem Relation Age of Onset  . Hypertension Father   . Pancreatic cancer Father   . CAD Mother   . Diabetes Mother   . Cancer Paternal Grandmother        unknown  . Cancer Paternal Grandfather        unknown  . Breast cancer Neg Hx     ALLERGIES:  has No Known Allergies.  MEDICATIONS:  Current Outpatient Medications  Medication Sig Dispense Refill  . acetaminophen (TYLENOL) 500 MG tablet Take 1 tablet (500 mg  total) by mouth every 6 (six) hours as needed. 30 tablet 0  . atorvastatin (LIPITOR) 40 MG tablet Take 1 tablet (40 mg total) by mouth daily. 30 tablet 0  . diltiazem (CARDIZEM) 60 MG tablet Take 1 tablet (60 mg total) by mouth every 6 (six) hours. 120 tablet 0  . ferrous sulfate 325 (65 FE) MG tablet Take 325 mg by mouth daily with breakfast.    . FLUoxetine (PROZAC) 10 MG capsule Take 10 mg by mouth daily.    Marland Kitchen. gabapentin (NEURONTIN) 300 MG capsule TAKE 1 CAPSULE BY MOUTH NIGHTLY    . methocarbamol (ROBAXIN) 500 MG tablet Take 500 mg by mouth 2 (two) times daily as needed.    . metoprolol succinate (TOPROL-XL) 100 MG 24 hr tablet Take 1 tablet (100 mg total) by mouth daily. Take with or immediately following a meal. 30 tablet 0  . Multiple Vitamin (MULTIVITAMIN WITH MINERALS) TABS tablet Take 1 tablet by mouth daily.    . vitamin B-12 (CYANOCOBALAMIN) 1000 MCG tablet Take 1 tablet (1,000 mcg total) by mouth daily. 90 tablet 3  . Vitamin D, Ergocalciferol, (DRISDOL) 1.25 MG (50000 UNIT) CAPS capsule Take 50,000 Units by mouth once a week.     No current facility-administered medications for this visit.     PHYSICAL EXAMINATION: ECOG PERFORMANCE STATUS: 1 - Symptomatic but completely  ambulatory Vitals:   10/22/19 1312  BP: (!) 146/91  Pulse: 90  Resp: 18  Temp: (!) 97.1 F (36.2 C)   Filed Weights   10/22/19 1312  Weight: 242 lb 4.8 oz (109.9 kg)    Physical Exam Constitutional:      General: She is not in acute distress. HENT:     Head: Normocephalic and atraumatic.  Eyes:     General: No scleral icterus.    Pupils: Pupils are equal, round, and reactive to light.  Cardiovascular:     Rate and Rhythm: Normal rate and regular rhythm.     Heart sounds: Normal heart sounds.  Pulmonary:     Effort: Pulmonary effort is normal. No respiratory distress.     Breath sounds: No wheezing.  Abdominal:     General: Bowel sounds are normal. There is no distension.     Palpations: Abdomen is soft. There is no mass.     Tenderness: There is no abdominal tenderness.  Musculoskeletal:        General: No deformity. Normal range of motion.     Cervical back: Normal range of motion and neck supple.  Skin:    General: Skin is warm and dry.     Findings: No erythema or rash.  Neurological:     Mental Status: She is alert and oriented to person, place, and time.     Cranial Nerves: No cranial nerve deficit.     Coordination: Coordination normal.  Psychiatric:        Behavior: Behavior normal.        Thought Content: Thought content normal.      LABORATORY DATA:  I have reviewed the data as listed Lab Results  Component Value Date   WBC 9.1 10/21/2019   HGB 10.3 (L) 10/21/2019   HCT 34.7 (L) 10/21/2019   MCV 82.2 10/21/2019   PLT 287 10/21/2019   Recent Labs    06/30/19 1912 06/30/19 1912 08/18/19 1543 08/19/19 0721 08/20/19 0625 09/05/19 1109  NA 138   < > 135 134* 138  --   K 4.2   < > 3.9  3.7 3.6  --   CL 105   < > 106 102 105  --   CO2 25   < > 22 25 24   --   GLUCOSE 102*   < > 114* 139* 115*  --   BUN 20   < > 13 13 14   --   CREATININE 1.11*   < > 1.01* 0.96 0.98  --   CALCIUM 9.3   < > 8.4* 8.9 8.9  --   GFRNONAA 58*   < > >60 >60 >60  --    GFRAA >60   < > >60 >60 >60  --   PROT 7.2  --   --   --   --  7.4  ALBUMIN 3.7  --   --   --   --  3.6  AST 29  --   --   --   --  25  ALT 28  --   --   --   --  31  ALKPHOS 78  --   --   --   --  85  BILITOT 0.5  --   --   --   --  0.3  BILIDIR  --   --   --   --   --  0.1  IBILI  --   --   --   --   --  0.2*   < > = values in this interval not displayed.   Iron/TIBC/Ferritin/ %Sat    Component Value Date/Time   IRON 15 (L) 10/21/2019 1400   TIBC 365 10/21/2019 1400   FERRITIN 23 10/21/2019 1400   IRONPCTSAT 4 (L) 10/21/2019 1400     RADIOGRAPHIC STUDIES: I have personally reviewed the radiological images as listed and agreed with the findings in the report. 07/07/2018 12/21/2019 pelvis Mild endometrial thickening in this perimenopausal patient with bleeding. Recommend GYN consultation  #07/10/2018 hemoglobinopathy evaluation reviewed normal.  ASSESSMENT & PLAN:  1. Iron deficiency anemia due to chronic blood loss   2. Blood in stool    #Iron deficiency anemia, Status post IV Venofer treatments.  Labs are reviewed and discussed with patient. Hemoglobin has significantly improved.  Iron level has improved but is still consistent with iron deficiency. Recommend to proceed with IV Venofer 200 mg x 1 today. Suspect blood loss  Intermittent blood in the stool.  I will refer patient to reestablish care with gastroenterology for further evaluation.  Last endoscopy and colonoscopy was in 2018.  Patient may need to have capsule study done.   #Low serum B12, B12 is normal  Orders Placed This Encounter  Procedures  . CBC with Differential/Platelet    Standing Status:   Future    Standing Expiration Date:   10/21/2020  . Ferritin    Standing Status:   Future    Standing Expiration Date:   10/21/2020  . Iron and TIBC    Standing Status:   Future    Standing Expiration Date:   10/21/2020  . Ambulatory referral to Gastroenterology    Referral Priority:   Routine    Referral Type:    Consultation    Referral Reason:   Specialty Services Required    Referred to Provider:   12/21/2020, MD    Number of Visits Requested:   1    All questions were answered. The patient knows to call the clinic with any problems questions or concerns.  Follow-up in 10 weeks.  12/21/2020,  MD, PhD  10/22/2019

## 2019-11-11 ENCOUNTER — Ambulatory Visit: Payer: BC Managed Care – PPO | Admitting: Internal Medicine

## 2019-12-29 ENCOUNTER — Inpatient Hospital Stay: Payer: BC Managed Care – PPO | Attending: Oncology

## 2019-12-30 ENCOUNTER — Inpatient Hospital Stay: Payer: BC Managed Care – PPO | Admitting: Oncology

## 2019-12-30 ENCOUNTER — Inpatient Hospital Stay: Payer: BC Managed Care – PPO

## 2020-01-14 ENCOUNTER — Encounter: Payer: Self-pay | Admitting: Emergency Medicine

## 2020-01-14 ENCOUNTER — Ambulatory Visit
Admission: EM | Admit: 2020-01-14 | Discharge: 2020-01-14 | Disposition: A | Discharge status: Home / Self Care | Payer: BC Managed Care – PPO | Attending: Family Medicine | Admitting: Family Medicine

## 2020-01-14 ENCOUNTER — Other Ambulatory Visit: Payer: Self-pay

## 2020-01-14 DIAGNOSIS — R0789 Other chest pain: Secondary | ICD-10-CM

## 2020-01-14 DIAGNOSIS — I499 Cardiac arrhythmia, unspecified: Secondary | ICD-10-CM

## 2020-01-14 DIAGNOSIS — I209 Angina pectoris, unspecified: Secondary | ICD-10-CM

## 2020-01-14 HISTORY — DX: Cardiac arrhythmia, unspecified: I49.9

## 2020-01-14 HISTORY — DX: Angina pectoris, unspecified: I20.9

## 2020-01-14 MED ORDER — TRAMADOL HCL 50 MG PO TABS: 50.0000 mg | ORAL_TABLET | Freq: Three times a day (TID) | ORAL | 0 refills | Status: DC | PRN

## 2020-01-14 NOTE — ED Provider Notes (Signed)
[facility]    CSN: [id number] Arrival date & time: 01/14/20  6237      History   Chief Complaint Chief Complaint  Patient presents with   Chest Pain   HPI  49 year old female presents with the above complaint.  Patient reports right-sided chest pain which started on Sunday.  She states that it is worse when she moves and also when she coughs or sneezes.  Pain 7/10 in severity.  Has not improved.  Interferes with sleep.  Denies shortness of breath.  No other reported symptoms.  No other complaints at this time.   Past Medical History:  Diagnosis Date   Arthritis    Blood transfusion without reported diagnosis    Chickenpox    Depression    Frequent headaches    Headache    History of blood transfusion    Hyperlipidemia    Hypertension    Iron deficiency anemia due to chronic blood loss 07/14/2018   Sleep apnea    CPAP ordered but can't tolerate it    Patient Active Problem List   Diagnosis Date Noted   Atrial fibrillation (HCC) 08/19/2019   Chest pain 08/18/2019   Atrial fibrillation with RVR (HCC) 08/18/2019   Iron deficiency anemia due to chronic blood loss 07/14/2018   Anemia 07/06/2018   Uncontrolled hypertension 06/14/2018   Iron deficiency anemia    Hematochezia    Symptomatic anemia 05/17/2016   Prediabetes 03/24/2015   Chronic tension-type headache, not intractable 03/18/2015   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   Essential hypertension 03/18/2015    Past Surgical History:  Procedure Laterality Date   CESAREAN SECTION     COLONOSCOPY WITH PROPOFOL N/A 05/19/2016   Procedure: COLONOSCOPY WITH PROPOFOL;  Surgeon: Midge Minium, MD;  Location: [facility] ENDOSCOPY;  Service: Endoscopy;  Laterality: N/A;   ESOPHAGOGASTRODUODENOSCOPY (EGD) WITH PROPOFOL N/A 05/19/2016   Procedure: ESOPHAGOGASTRODUODENOSCOPY (EGD) WITH PROPOFOL;  Surgeon: Midge Minium, MD;  Location: [facility] ENDOSCOPY;  Service: Endoscopy;  Laterality: N/A;    HYSTEROSCOPY WITH D & C N/A 08/13/2018   Procedure: DILATATION AND CURETTAGE /HYSTEROSCOPY WITH ENDOMETRIAL ABLATION;  Surgeon: Natale Milch, MD;  Location: [facility] ORS;  Service: Gynecology;  Laterality: N/A;   KNEE SURGERY Bilateral    KNEE SURGERY     right and left ACL   TUBAL LIGATION  1997    OB History    Gravida  4   Para  3   Term  3   Preterm      AB  1   Living  3     SAB  1   IAB      Ectopic      Multiple      Live Births  3        Obstetric Comments  S/p BTL         Home Medications    Prior to Admission medications   Medication Sig Start Date End Date Taking? Authorizing Provider  acetaminophen (TYLENOL) 500 MG tablet Take 1 tablet (500 mg total) by mouth every 6 (six) hours as needed. 07/10/19  Yes Lamptey, Britta Mccreedy, MD  ferrous sulfate 325 (65 FE) MG tablet Take 325 mg by mouth daily with breakfast.   Yes [provider]  FLUoxetine (PROZAC) 10 MG capsule Take 10 mg by mouth daily. 07/16/19  Yes [provider]  gabapentin (NEURONTIN) 300 MG capsule TAKE 1 CAPSULE BY MOUTH NIGHTLY 09/15/19  Yes [provider]  methocarbamol (ROBAXIN) 500  MG tablet Take 500 mg by mouth 2 (two) times daily as needed. 09/15/19  Yes [provider]  Multiple Vitamin (MULTIVITAMIN WITH MINERALS) TABS tablet Take 1 tablet by mouth daily. 05/20/16  Yes Gouru, Deanna Artis, MD  traMADol (ULTRAM) 50 MG tablet Take 1 tablet (50 mg total) by mouth every 8 (eight) hours as needed. 01/14/20  Yes Ryker Sudbury G, DO  vitamin B-12 (CYANOCOBALAMIN) 1000 MCG tablet Take 1 tablet (1,000 mcg total) by mouth daily. 10/15/18  Yes Rickard Patience, MD  Vitamin D, Ergocalciferol, (DRISDOL) 1.25 MG (50000 UNIT) CAPS capsule Take 50,000 Units by mouth once a week. 07/17/19  Yes [provider]  atorvastatin (LIPITOR) 40 MG tablet Take 1 tablet (40 mg total) by mouth daily. 08/21/19 10/22/19  Gertha Calkin, MD  diltiazem (CARDIZEM) 60 MG tablet Take 1 tablet  (60 mg total) by mouth every 6 (six) hours. 08/20/19 10/22/19  Gertha Calkin, MD  metoprolol succinate (TOPROL-XL) 100 MG 24 hr tablet Take 1 tablet (100 mg total) by mouth daily. Take with or immediately following a meal. 08/21/19 10/22/19  Gertha Calkin, MD    Family History Family History  Problem Relation Age of Onset   Hypertension Father    Pancreatic cancer Father    CAD Mother    Diabetes Mother    Cancer Paternal Grandmother        unknown   Cancer Paternal Grandfather        unknown   Breast cancer Neg Hx     Social History Social History   Tobacco Use   Smoking status: Never Smoker   Smokeless tobacco: Never Used  Vaping Use   Vaping Use: Never used  Substance Use Topics   Alcohol use: Yes    Alcohol/week: 1.0 standard drink    Types: 1 Cans of beer per week    Comment: occational   Drug use: No     Allergies   Patient has no known allergies.   Review of Systems Review of Systems  Respiratory: Negative for shortness of breath.   Cardiovascular: Positive for chest pain.   Physical Exam Triage Vital Signs ED Triage Vitals  Enc Vitals Group     BP 01/14/20 0925 (!) 182/99     Pulse Rate 01/14/20 0925 90     Resp 01/14/20 0925 18     Temp 01/14/20 0925 98.2 F (36.8 C)     Temp Source 01/14/20 0925 Oral     SpO2 01/14/20 0925 99 %     Weight 01/14/20 0924 210 lb (95.3 kg)     Height 01/14/20 0924 5\' 4"  (1.626 m)     Head Circumference --      Peak Flow --      Pain Score 01/14/20 0924 7     Pain Loc --      Pain Edu? --      Excl. in GC? --    Updated Vital Signs BP (!) 182/99 (BP Location: Left Arm)    Pulse 90    Temp 98.2 F (36.8 C) (Oral)    Resp 18    Ht 5\' 4"  (1.626 m)    Wt 95.3 kg    SpO2 99%    BMI 36.05 kg/m   Visual Acuity Right Eye Distance:   Left Eye Distance:   Bilateral Distance:    Right Eye Near:   Left Eye Near:    Bilateral Near:     Physical Exam Constitutional:  General: She is not in acute  distress.    Appearance: Normal appearance. She is not ill-appearing.  HENT:     Head: Normocephalic and atraumatic.  Eyes:     General:        Right eye: No discharge.        Left eye: No discharge.     Conjunctiva/sclera: Conjunctivae normal.  Cardiovascular:     Rate and Rhythm: Normal rate and regular rhythm.  Pulmonary:     Effort: Pulmonary effort is normal.     Breath sounds: Normal breath sounds.  Chest:     Chest wall: Tenderness present.  Neurological:     Mental Status: She is alert.  Psychiatric:        Mood and Affect: Mood normal.        Behavior: Behavior normal.    UC Treatments / Results  Labs (all labs ordered are listed, but only abnormal results are displayed) Labs Reviewed - No data to display  EKG Interpretation: Normal sinus rhythm with rate of 86.  Normal axis.  Probable LAE.  No ST or T wave changes.  Unremarkable EKG.  Radiology No results found.  Procedures Procedures (including critical care time)  Medications Ordered in UC Medications - No data to display  Initial Impression / Assessment and Plan / UC Course  I have reviewed the triage vital signs and the nursing notes.  Pertinent labs & imaging results that were available during my care of the patient were reviewed by me and considered in my medical decision making (see chart for details).    49 year old female presents with chest wall pain.  Tramadol as needed for pain.  Supportive care  Final Clinical Impressions(s) / UC Diagnoses   Final diagnoses:  Chest wall pain   Discharge Instructions   None    ED Prescriptions    Medication Sig Dispense Auth. Provider   traMADol (ULTRAM) 50 MG tablet Take 1 tablet (50 mg total) by mouth every 8 (eight) hours as needed. 15 tablet Everlene Other G, DO     I have reviewed the PDMP during this encounter.   Tommie Sams, DO 01/14/20 1049

## 2020-01-14 NOTE — ED Triage Notes (Signed)
Patient c/o chest pain that started on Sunday. Denies any other symptoms.

## 2020-02-04 ENCOUNTER — Telehealth: Payer: Self-pay

## 2020-02-04 NOTE — Telephone Encounter (Signed)
Pt needs to reschedule her sleep study and then make a F/U appt. LMOM

## 2020-06-07 ENCOUNTER — Telehealth: Payer: Self-pay | Admitting: Oncology

## 2020-06-07 NOTE — Telephone Encounter (Signed)
Patient called to reschedule her lab/md/venofer appointment from 12/2019.  Appointments rescheduled for [facility] clinic.  Routing to clinic team to verify lab orders are entered.

## 2020-06-08 ENCOUNTER — Inpatient Hospital Stay: Payer: BC Managed Care – PPO | Attending: Oncology

## 2020-06-08 ENCOUNTER — Other Ambulatory Visit: Payer: Self-pay

## 2020-06-08 DIAGNOSIS — D500 Iron deficiency anemia secondary to blood loss (chronic): Secondary | ICD-10-CM | POA: Insufficient documentation

## 2020-06-08 LAB — CBC WITH DIFFERENTIAL/PLATELET
Abs Immature Granulocytes: 0.05 10*3/uL (ref 0.00–0.07)
Basophils Absolute: 0.1 10*3/uL (ref 0.0–0.1)
Basophils Relative: 1 %
Eosinophils Absolute: 1.2 10*3/uL — ABNORMAL HIGH (ref 0.0–0.5)
Eosinophils Relative: 15 %
HCT: 29.6 % — ABNORMAL LOW (ref 36.0–46.0)
Hemoglobin: 8.2 g/dL — ABNORMAL LOW (ref 12.0–15.0)
Immature Granulocytes: 1 %
Lymphocytes Relative: 23 %
Lymphs Abs: 1.9 10*3/uL (ref 0.7–4.0)
MCH: 20.8 pg — ABNORMAL LOW (ref 26.0–34.0)
MCHC: 27.7 g/dL — ABNORMAL LOW (ref 30.0–36.0)
MCV: 74.9 fL — ABNORMAL LOW (ref 80.0–100.0)
Monocytes Absolute: 0.5 10*3/uL (ref 0.1–1.0)
Monocytes Relative: 6 %
Neutro Abs: 4.4 10*3/uL (ref 1.7–7.7)
Neutrophils Relative %: 54 %
Platelets: 199 10*3/uL (ref 150–400)
RBC: 3.95 MIL/uL (ref 3.87–5.11)
RDW: 24 % — ABNORMAL HIGH (ref 11.5–15.5)
WBC: 8.2 10*3/uL (ref 4.0–10.5)
nRBC: 0 % (ref 0.0–0.2)

## 2020-06-08 LAB — IRON AND TIBC
Iron: 265 ug/dL — ABNORMAL HIGH (ref 28–170)
Saturation Ratios: 53 % — ABNORMAL HIGH (ref 10.4–31.8)
TIBC: 500 ug/dL — ABNORMAL HIGH (ref 250–450)
UIBC: 235 ug/dL

## 2020-06-08 LAB — FERRITIN: Ferritin: 7 ng/mL — ABNORMAL LOW (ref 11–307)

## 2020-06-10 ENCOUNTER — Inpatient Hospital Stay: Payer: BC Managed Care – PPO | Admitting: Oncology

## 2020-06-10 ENCOUNTER — Encounter: Payer: Self-pay | Admitting: Oncology

## 2020-06-10 ENCOUNTER — Other Ambulatory Visit: Payer: Self-pay

## 2020-06-10 ENCOUNTER — Ambulatory Visit: Payer: BC Managed Care – PPO

## 2020-06-10 VITALS — BP 168/96 | HR 80 | Temp 98.7°F | Resp 18 | Wt 246.5 lb

## 2020-06-10 DIAGNOSIS — R195 Other fecal abnormalities: Secondary | ICD-10-CM

## 2020-06-10 DIAGNOSIS — D500 Iron deficiency anemia secondary to blood loss (chronic): Secondary | ICD-10-CM

## 2020-06-10 MED ORDER — SODIUM CHLORIDE 0.9 % IV SOLN
Freq: Once | INTRAVENOUS | Status: AC
  Filled 2020-06-10: qty 250

## 2020-06-10 MED ORDER — IRON SUCROSE 20 MG/ML IV SOLN
200.0000 mg | Freq: Once | INTRAVENOUS | Status: AC
  Administered 2020-06-10: 200 mg via INTRAVENOUS

## 2020-06-10 NOTE — Patient Instructions (Signed)

## 2020-06-10 NOTE — Progress Notes (Signed)
Hematology/Oncology follow up  note [facility] Telephone:(336) [phone] [phone]   Patient Care Team: Jerrilyn Cairo[place], [facility] as PCP - General Rickard PatienceYu, Jenin Birdsall, MD as Consulting Physician (Oncology)  REFERRING PROVIDER: Jerrilyn CairoM[place]ane, [facility]*  CHIEF COMPLAINTS/REASON FOR VISIT:  Follow up for  anemia  HISTORY OF PRESENTING ILLNESS:  Madison Lewis is a  50 y.o.  female with PMH listed below was seen in consultation at the request of  Norma Fredricksonoledo, Boykin Nearingeodoro K, MD  for evaluation of anemia.   Reviewed patient's recent labs 07/06/2018 Labs revealed anemia with hemoglobin of 6.6, MCV 76.1.  Platelet count 522. Patient was advised by Dr. Norma Fredricksonoledo to go to emergency room.  She had hospitalization from 07/05/2020 07/07/2018. She received 2 units of packed RBC transfusion, received IV iron during admission as well. Pelvic ultrasound showed mild endometrial thickening Anemia work-up consistent with iron deficiency anemia and low B12 level. She also received a B12 injection during admission.  Today patient reports feeling slightly better.  Fatigue has improved after recent blood transfusion and IV Venofer 400mg  x1. Patient craves for ice.  Associated signs and symptoms: Patient reports fatigue.  SOB with exertion.  Denies weight loss, easy bruising, hematochezia, hemoptysis, hematuria. Context: History of GI bleeding: Denies               History of Chronic kidney disease; CKD               History of autoimmune disease denies               History of hemolytic anemia.  Denies               Last colonoscopy: Colonoscopy and upper endoscopy 05/19/2016 showed nonbleeding internal hemorrhoids and hiatal hernia. No menses after endometrial ablation.  # hospitalized due to atrial fibrillation with RVR, she has a low chads score of 2 and due to her anemia, cardiology held anticoagulation. # Intermittently she has blood in the stool after eating spicy food, she  attributes to hemorrhoid bleeding. Last colonoscopy was done in 2018 by Dr. Servando SnareWohl  INTERVAL HISTORY Madison Lewis is a 50 y.o. female who has above history reviewed by me today presents for follow up visit for management of anemia Problems and complaints are listed below: Patient reports feeling more tired.  Craving ice again. Denies any bloody stool.  Sometimes stool is dark.  Denies any pain, nausea vomiting diarrhea.   Review of Systems  Constitutional: Positive for fatigue. Negative for appetite change, chills and fever.  HENT:   Negative for hearing loss and voice change.   Eyes: Negative for eye problems.  Respiratory: Negative for chest tightness, cough and shortness of breath.   Cardiovascular: Negative for chest pain.  Gastrointestinal: Negative for abdominal distention, abdominal pain and blood in stool.  Endocrine: Negative for hot flashes.  Genitourinary: Negative for difficulty urinating and frequency.   Musculoskeletal: Negative for arthralgias.  Skin: Negative for itching and rash.  Neurological: Negative for extremity weakness.  Hematological: Negative for adenopathy.  Psychiatric/Behavioral: Negative for confusion.    MEDICAL HISTORY:  Past Medical History:  Diagnosis Date  . Arthritis   . Blood transfusion without reported diagnosis   . Chickenpox   . Depression   . Frequent headaches   . Headache   . History of blood transfusion   . Hyperlipidemia   . Hypertension   . Iron deficiency anemia due to chronic blood loss 07/14/2018  . Sleep apnea  CPAP ordered but can't tolerate it    SURGICAL HISTORY: Past Surgical History:  Procedure Laterality Date  . CESAREAN SECTION    . COLONOSCOPY WITH PROPOFOL N/A 05/19/2016   Procedure: COLONOSCOPY WITH PROPOFOL;  Surgeon: Midge Minium, MD;  Location: [facility] ENDOSCOPY;  Service: Endoscopy;  Laterality: N/A;  . ESOPHAGOGASTRODUODENOSCOPY (EGD) WITH PROPOFOL N/A 05/19/2016   Procedure:  ESOPHAGOGASTRODUODENOSCOPY (EGD) WITH PROPOFOL;  Surgeon: Midge Minium, MD;  Location: [facility] ENDOSCOPY;  Service: Endoscopy;  Laterality: N/A;  . HYSTEROSCOPY WITH D & C N/A 08/13/2018   Procedure: DILATATION AND CURETTAGE /HYSTEROSCOPY WITH ENDOMETRIAL ABLATION;  Surgeon: Natale Milch, MD;  Location: [facility] ORS;  Service: Gynecology;  Laterality: N/A;  . KNEE SURGERY Bilateral   . KNEE SURGERY     right and left ACL  . TUBAL LIGATION  1997    SOCIAL HISTORY: Social History   Socioeconomic History  . Marital status: Single    Spouse name: Not on file  . Number of children: Not on file  . Years of education: Not on file  . Highest education level: Not on file  Occupational History  . Not on file  Tobacco Use  . Smoking status: Never Smoker  . Smokeless tobacco: Never Used  Vaping Use  . Vaping Use: Never used  Substance and Sexual Activity  . Alcohol use: Yes    Alcohol/week: 1.0 standard drink    Types: 1 Cans of beer per week    Comment: occational  . Drug use: No  . Sexual activity: Not Currently    Birth control/protection: Surgical    Comment: Tubal ligation   Other Topics Concern  . Not on file  Social History Narrative  . Not on file   Social Determinants of Health   Financial Resource Strain: Not on file  Food Insecurity: Not on file  Transportation Needs: Not on file  Physical Activity: Not on file  Stress: Not on file  Social Connections: Not on file  Intimate Partner Violence: Not on file    FAMILY HISTORY: Family History  Problem Relation Age of Onset  . Hypertension Father   . Pancreatic cancer Father   . CAD Mother   . Diabetes Mother   . Cancer Paternal Grandmother        unknown  . Cancer Paternal Grandfather        unknown  . Breast cancer Neg Hx     ALLERGIES:  has No Known Allergies.  MEDICATIONS:  Current Outpatient Medications  Medication Sig Dispense Refill  . acetaminophen (TYLENOL) 500 MG tablet Take 1 tablet (500 mg  total) by mouth every 6 (six) hours as needed. 30 tablet 0  . albuterol (VENTOLIN HFA) 108 (90 Base) MCG/ACT inhaler Inhale 2 puffs into the lungs every 4 (four) hours as needed.    Marland Kitchen atorvastatin (LIPITOR) 40 MG tablet Take 1 tablet (40 mg total) by mouth daily. 30 tablet 0  . diltiazem (CARDIZEM) 60 MG tablet Take 1 tablet (60 mg total) by mouth every 6 (six) hours. 120 tablet 0  . ferrous sulfate 325 (65 FE) MG tablet Take 325 mg by mouth daily with breakfast.    . FLUoxetine (PROZAC) 10 MG capsule Take 10 mg by mouth daily.    . fluticasone (FLONASE) 50 MCG/ACT nasal spray Place 1 spray into both nostrils 2 (two) times daily.    Marland Kitchen gabapentin (NEURONTIN) 300 MG capsule TAKE 1 CAPSULE BY MOUTH NIGHTLY    . methocarbamol (ROBAXIN) 500 MG tablet Take  500 mg by mouth 2 (two) times daily as needed.    . metoprolol succinate (TOPROL-XL) 100 MG 24 hr tablet Take 1 tablet (100 mg total) by mouth daily. Take with or immediately following a meal. 30 tablet 0  . Multiple Vitamin (MULTIVITAMIN WITH MINERALS) TABS tablet Take 1 tablet by mouth daily.    . vitamin B-12 (CYANOCOBALAMIN) 1000 MCG tablet Take 1 tablet (1,000 mcg total) by mouth daily. 90 tablet 3  . Vitamin D, Ergocalciferol, (DRISDOL) 1.25 MG (50000 UNIT) CAPS capsule Take 50,000 Units by mouth once a week.    . benzonatate (TESSALON) 200 MG capsule Take 400 mg by mouth every 8 (eight) hours as needed. (Patient not taking: Reported on 06/10/2020)    . traMADol (ULTRAM) 50 MG tablet Take 1 tablet (50 mg total) by mouth every 8 (eight) hours as needed. (Patient not taking: Reported on 06/10/2020) 15 tablet 0   No current facility-administered medications for this visit.     PHYSICAL EXAMINATION: ECOG PERFORMANCE STATUS: 1 - Symptomatic but completely ambulatory Vitals:   06/10/20 1427  BP: (!) 168/96  Pulse: 80  Resp: 18  Temp: 98.7 F (37.1 C)  SpO2: 98%   Filed Weights   06/10/20 1427  Weight: 246 lb 7.6 oz (111.8 kg)     Physical Exam Constitutional:      General: She is not in acute distress. HENT:     Head: Normocephalic and atraumatic.  Eyes:     General: No scleral icterus.    Pupils: Pupils are equal, round, and reactive to light.  Cardiovascular:     Rate and Rhythm: Normal rate and regular rhythm.     Heart sounds: Normal heart sounds.  Pulmonary:     Effort: Pulmonary effort is normal. No respiratory distress.     Breath sounds: No wheezing.  Abdominal:     General: Bowel sounds are normal. There is no distension.     Palpations: Abdomen is soft. There is no mass.     Tenderness: There is no abdominal tenderness.  Musculoskeletal:        General: No deformity. Normal range of motion.     Cervical back: Normal range of motion and neck supple.  Skin:    General: Skin is warm and dry.     Findings: No erythema or rash.  Neurological:     Mental Status: She is alert and oriented to person, place, and time.     Cranial Nerves: No cranial nerve deficit.     Coordination: Coordination normal.  Psychiatric:        Behavior: Behavior normal.        Thought Content: Thought content normal.      LABORATORY DATA:  I have reviewed the data as listed Lab Results  Component Value Date   WBC 8.2 06/08/2020   HGB 8.2 (L) 06/08/2020   HCT 29.6 (L) 06/08/2020   MCV 74.9 (L) 06/08/2020   PLT 199 06/08/2020   Recent Labs    06/30/19 1912 08/18/19 1543 08/19/19 0721 08/20/19 0625 09/05/19 1109  NA 138 135 134* 138  --   K 4.2 3.9 3.7 3.6  --   CL 105 106 102 105  --   CO2 25 22 25 24   --   GLUCOSE 102* 114* 139* 115*  --   BUN 20 13 13 14   --   CREATININE 1.11* 1.01* 0.96 0.98  --   CALCIUM 9.3 8.4* 8.9 8.9  --   GFRNONAA  58* >60 >60 >60  --   GFRAA >60 >60 >60 >60  --   PROT 7.2  --   --   --  7.4  ALBUMIN 3.7  --   --   --  3.6  AST 29  --   --   --  25  ALT 28  --   --   --  31  ALKPHOS 78  --   --   --  85  BILITOT 0.5  --   --   --  0.3  BILIDIR  --   --   --   --  0.1   IBILI  --   --   --   --  0.2*   Iron/TIBC/Ferritin/ %Sat    Component Value Date/Time   IRON 265 (H) 06/08/2020 1021   TIBC 500 (H) 06/08/2020 1021   FERRITIN 7 (L) 06/08/2020 1021   IRONPCTSAT 53 (H) 06/08/2020 1021      #07/10/2018 hemoglobinopathy evaluation reviewed normal.  ASSESSMENT & PLAN:  1. Iron deficiency anemia due to chronic blood loss   2. Dark stools    #Iron deficiency anemia, Status post IV Venofer treatments.   Labs reviewed and discussed with patient.  Consistent with worsening of iron deficiency anemia. Recommend to proceed with IV Venofer 200 mg x 4 today. Suspect blood loss  Intermittent blood in the stool.   Last endoscopy and colonoscopy was in 2018.  I will refer patient to reestablish care with gastroenterology for further evaluation.   Follow-up in 6 weeks.  Lab MD +/- Venofer. All questions were answered. The patient knows to call the clinic with any problems questions or concerns.    Rickard Patience, MD, PhD  06/10/2020

## 2020-06-10 NOTE — Progress Notes (Signed)
Pt tolerated venofer infusion well with no problems or complaints.  Pt left infusion suite stable and ambulatory.  

## 2020-06-11 ENCOUNTER — Other Ambulatory Visit: Payer: Self-pay

## 2020-06-11 DIAGNOSIS — R195 Other fecal abnormalities: Secondary | ICD-10-CM

## 2020-06-11 DIAGNOSIS — D500 Iron deficiency anemia secondary to blood loss (chronic): Secondary | ICD-10-CM

## 2020-06-15 ENCOUNTER — Inpatient Hospital Stay: Payer: BC Managed Care – PPO

## 2020-06-15 ENCOUNTER — Other Ambulatory Visit: Payer: Self-pay

## 2020-06-15 VITALS — BP 163/91 | HR 86 | Temp 97.7°F | Resp 18

## 2020-06-15 DIAGNOSIS — D500 Iron deficiency anemia secondary to blood loss (chronic): Secondary | ICD-10-CM | POA: Diagnosis not present

## 2020-06-15 MED ORDER — SODIUM CHLORIDE 0.9 % IV SOLN
Freq: Once | INTRAVENOUS | Status: AC
  Filled 2020-06-15: qty 250

## 2020-06-15 MED ORDER — IRON SUCROSE 20 MG/ML IV SOLN
200.0000 mg | Freq: Once | INTRAVENOUS | Status: AC
  Administered 2020-06-15: 200 mg via INTRAVENOUS
  Filled 2020-06-15: qty 10

## 2020-06-15 NOTE — Patient Instructions (Signed)

## 2020-06-15 NOTE — Progress Notes (Signed)
Pt received IV Venofer today. Tolerated well. No complaints at time of discharge.

## 2020-06-22 ENCOUNTER — Inpatient Hospital Stay: Payer: BC Managed Care – PPO | Attending: Oncology

## 2020-06-22 VITALS — BP 169/95 | HR 79 | Resp 18

## 2020-06-22 DIAGNOSIS — D500 Iron deficiency anemia secondary to blood loss (chronic): Secondary | ICD-10-CM | POA: Diagnosis present

## 2020-06-22 MED ORDER — IRON SUCROSE 20 MG/ML IV SOLN
200.0000 mg | Freq: Once | INTRAVENOUS | Status: AC
  Administered 2020-06-22: 200 mg via INTRAVENOUS
  Filled 2020-06-22: qty 10

## 2020-06-22 MED ORDER — SODIUM CHLORIDE 0.9 % IV SOLN
INTRAVENOUS | Status: DC
  Filled 2020-06-22: qty 250

## 2020-06-29 ENCOUNTER — Inpatient Hospital Stay: Payer: BC Managed Care – PPO | Attending: Nurse Practitioner

## 2020-07-15 ENCOUNTER — Encounter: Payer: Self-pay | Admitting: Gastroenterology

## 2020-07-15 ENCOUNTER — Inpatient Hospital Stay: Payer: BC Managed Care – PPO

## 2020-07-15 ENCOUNTER — Other Ambulatory Visit: Payer: Self-pay

## 2020-07-15 ENCOUNTER — Inpatient Hospital Stay: Payer: BC Managed Care – PPO | Admitting: Oncology

## 2020-07-15 ENCOUNTER — Ambulatory Visit: Payer: BC Managed Care – PPO | Admitting: Gastroenterology

## 2020-07-15 VITALS — BP 175/100 | HR 97 | Temp 98.4°F | Ht 64.0 in | Wt 244.2 lb

## 2020-07-15 DIAGNOSIS — D500 Iron deficiency anemia secondary to blood loss (chronic): Secondary | ICD-10-CM

## 2020-07-15 DIAGNOSIS — K449 Diaphragmatic hernia without obstruction or gangrene: Secondary | ICD-10-CM | POA: Diagnosis not present

## 2020-07-15 MED ORDER — NA SULFATE-K SULFATE-MG SULF 17.5-3.13-1.6 GM/177ML PO SOLN: 1.0000 | Freq: Once | ORAL | 0 refills | Status: AC

## 2020-07-15 NOTE — Progress Notes (Signed)
Madison Lewis [address]  [address]  [place], [place] [address]  Main: [phone]  Fax: [phone]   Gastroenterology Consultation  Referring Provider:     Rickard Patience, MD Primary Care Physician:  [facility] Reason for Consultation:     Iron deficiency anemia        HPI:    Chief complaint: Anemia  Madison Lewis is a 50 y.o. y/o female referred for consultation & management  by Dr. Dan Humphreys, [facility].  Patient here for evaluation of iron deficiency anemia.  Patient sees hematology and is receiving IV iron transfusions.  Hematology note reviewed and their last visit was Jun 10, 2020 and they planned on further IV Venofer treatments.  Patient does report intermittent bright red blood per rectum, about once a month, with red blood on toilet paper and within the toilet bowl itself.  Otherwise denies any abdominal pain, weight loss, nausea or vomiting, dysphagia, heartburn.  Patient has previous history of iron deficiency anemia and was evaluated by gastroenterology before.  Previous evaluation done by [facility] GI, Dr. Norma Fredrickson in May 2020 and as per the note, EGD and colonoscopy was discussed at that time but patient declined but agreed to small bowel capsule study.  I do not have the official small bowel capsule study report in her chart, but there is a telephone note in care everywhere from June 2020 that I reviewed that states that small bowel capsule study was normal and that the report was sent for scanning.  Prior to this patient also had an EGD and colonoscopy in 2018 for iron deficiency anemia with colonoscopy showing internal hemorrhoids.  EGD showed hiatal hernia.  Previous notes also reveal the patient had menorrhagia previously and was placed on treatment with OB/GYN for this.  She states because of this she has not had a period in a year.  Past Medical History:  Diagnosis Date   Arthritis    Blood transfusion without  reported diagnosis    Chickenpox    Depression    Frequent headaches    Headache    History of blood transfusion    Hyperlipidemia    Hypertension    Iron deficiency anemia due to chronic blood loss 07/14/2018   Sleep apnea    CPAP ordered but can't tolerate it    Past Surgical History:  Procedure Laterality Date   CESAREAN SECTION     COLONOSCOPY WITH PROPOFOL N/A 05/19/2016   Procedure: COLONOSCOPY WITH PROPOFOL;  Surgeon: Midge Minium, MD;  Location: [facility] ENDOSCOPY;  Service: Endoscopy;  Laterality: N/A;   ESOPHAGOGASTRODUODENOSCOPY (EGD) WITH PROPOFOL N/A 05/19/2016   Procedure: ESOPHAGOGASTRODUODENOSCOPY (EGD) WITH PROPOFOL;  Surgeon: Midge Minium, MD;  Location: [facility] ENDOSCOPY;  Service: Endoscopy;  Laterality: N/A;   HYSTEROSCOPY WITH D & C N/A 08/13/2018   Procedure: DILATATION AND CURETTAGE /HYSTEROSCOPY WITH ENDOMETRIAL ABLATION;  Surgeon: Natale Milch, MD;  Location: [facility] ORS;  Service: Gynecology;  Laterality: N/A;   KNEE SURGERY Bilateral    KNEE SURGERY     right and left ACL   TUBAL LIGATION  1997    Prior to Admission medications   Medication Sig Start Date End Date Taking? Authorizing Provider  acetaminophen (TYLENOL) 500 MG tablet Take 1 tablet (500 mg total) by mouth every 6 (six) hours as needed. 07/10/19  Yes Lamptey, Britta Mccreedy, MD  albuterol (VENTOLIN HFA) 108 (90 Base) MCG/ACT inhaler Inhale 2 puffs into the lungs every 4 (four) hours as  needed. 02/05/20  Yes [provider]  benzonatate (TESSALON) 200 MG capsule Take 400 mg by mouth every 8 (eight) hours as needed. 02/05/20  Yes [provider]  ferrous sulfate 325 (65 FE) MG tablet Take 325 mg by mouth daily with breakfast.   Yes [provider]  fluticasone (FLONASE) 50 MCG/ACT nasal spray Place 1 spray into both nostrils 2 (two) times daily. 02/05/20  Yes [provider]  gabapentin (NEURONTIN) 300 MG capsule TAKE 1 CAPSULE BY MOUTH NIGHTLY 09/15/19  Yes [provider]  methocarbamol (ROBAXIN) 500 MG tablet Take 500 mg by mouth 2 (two) times daily as needed. 09/15/19  Yes [provider]  Multiple Vitamin (MULTIVITAMIN WITH MINERALS) TABS tablet Take 1 tablet by mouth daily. 05/20/16  Yes Gouru, Deanna Artis, MD  traMADol (ULTRAM) 50 MG tablet Take 1 tablet (50 mg total) by mouth every 8 (eight) hours as needed. 01/14/20  Yes Cook, Jayce G, DO  vitamin B-12 (CYANOCOBALAMIN) 1000 MCG tablet Take 1 tablet (1,000 mcg total) by mouth daily. 10/15/18  Yes Rickard Patience, MD  Vitamin D, Ergocalciferol, (DRISDOL) 1.25 MG (50000 UNIT) CAPS capsule Take 50,000 Units by mouth once a week. 07/17/19  Yes [provider]  atorvastatin (LIPITOR) 40 MG tablet Take 1 tablet (40 mg total) by mouth daily. 08/21/19 10/22/19  Gertha Calkin, MD  diltiazem (CARDIZEM) 60 MG tablet Take 1 tablet (60 mg total) by mouth every 6 (six) hours. 08/20/19 10/22/19  Gertha Calkin, MD  metoprolol succinate (TOPROL-XL) 100 MG 24 hr tablet Take 1 tablet (100 mg total) by mouth daily. Take with or immediately following a meal. 08/21/19 10/22/19  Gertha Calkin, MD    Family History  Problem Relation Age of Onset   CAD Mother    Diabetes Mother    Colon polyps Mother    Hypertension Father    Pancreatic cancer Father    Cancer Paternal Grandmother        unknown   Cancer Paternal Grandfather        unknown   Breast cancer Neg Hx      Social History   Tobacco Use   Smoking status: Never   Smokeless tobacco: Never  Vaping Use   Vaping Use: Never used  Substance Use Topics   Alcohol use: Yes    Alcohol/week: 1.0 standard drink    Types: 1 Cans of beer per week    Comment: occational   Drug use: No    Allergies as of 07/15/2020   (No Known Allergies)    Review of Systems:    All systems reviewed and negative except where noted in HPI.   Physical Exam:  Constitutional: General:   Alert,  Well-developed, well-nourished, pleasant and cooperative in NAD BP (!) 175/100    Pulse 97   Temp 98.4 F (36.9 C) (Oral)   Ht 5\' 4"  (1.626 m)   Wt 244 lb 3.2 oz (110.8 kg)   BMI 41.92 kg/m   Eyes:  Sclera clear, no icterus.   Conjunctiva pink. PERRLA  Ears:  No scars, lesions or masses, Normal auditory acuity. Nose:  No deformity, discharge, or lesions. Mouth:  No deformity or lesions, oropharynx pink & moist.  Neck:  Supple; no masses or thyromegaly.  Respiratory: Normal respiratory effort, Normal percussion  Gastrointestinal:  No bruits.  Soft, non-tender and non-distended without masses, hepatosplenomegaly or hernias noted.  No guarding or rebound tenderness.     Cardiac: No clubbing or edema.  No cyanosis. Normal posterior tibial pedal pulses noted.  Lymphatic:  No significant cervical or axillary adenopathy.  Psych:  Alert and cooperative. Normal mood and affect.  Musculoskeletal:  Normal gait. Head normocephalic, atraumatic. Symmetrical without gross deformities. 5/5 Upper and Lower extremity strength bilaterally.  Skin: Warm. Intact without significant lesions or rashes. No jaundice.  Neurologic:  Face symmetrical, tongue midline, Normal sensation to touch;  grossly normal neurologically.  Psych:  Alert and oriented x3, Alert and cooperative. Normal mood and affect.   Labs: CBC    Component Value Date/Time   WBC 8.2 06/08/2020 1021   RBC 3.95 06/08/2020 1021   HGB 8.2 (L) 06/08/2020 1021   HCT 29.6 (L) 06/08/2020 1021   PLT 199 06/08/2020 1021   MCV 74.9 (L) 06/08/2020 1021   MCH 20.8 (L) 06/08/2020 1021   MCHC 27.7 (L) 06/08/2020 1021   RDW 24.0 (H) 06/08/2020 1021   LYMPHSABS 1.9 06/08/2020 1021   MONOABS 0.5 06/08/2020 1021   EOSABS 1.2 (H) 06/08/2020 1021   BASOSABS 0.1 06/08/2020 1021   CMP     Component Value Date/Time   NA 138 08/20/2019 0625   K 3.6 08/20/2019 0625   CL 105 08/20/2019 0625   CO2 24 08/20/2019 0625   GLUCOSE 115 (H) 08/20/2019 0625   BUN 14 08/20/2019 0625   CREATININE 0.98 08/20/2019 0625   CALCIUM 8.9  08/20/2019 0625   PROT 7.4 09/05/2019 1109   ALBUMIN 3.6 09/05/2019 1109   AST 25 09/05/2019 1109   ALT 31 09/05/2019 1109   ALKPHOS 85 09/05/2019 1109   BILITOT 0.3 09/05/2019 1109   GFRNONAA >60 08/20/2019 0625   GFRAA >60 08/20/2019 9937   Other labs reviewed as below  Imaging Studies:   Assessment and Plan:   Lylliana Kitamura is a 50 y.o. y/o female has been referred for iron deficiency anemia  Patient has persistent anemia with worsening hemoglobin at 8.2 in May 2022 compared to 10.3 in October 2021.  Ferritin was low at 7.  Iron saturation did improve to 53 in May 2022 compared to 4 in October 2021 after iron replacement by hematology.   Given persistent anemia despite resolution of menorrhagia, EGD and colonoscopy indicated for further evaluation since endoluminal evaluation has not been done in 4 years  If EGD and colonoscopy is negative, patient may need small bowel capsule study and possible referral for hiatal hernia surgery as hiatal hernias can lead to iron deficiency anemia  I have discussed alternative options, risks & benefits,  which include, but are not limited to, bleeding, infection, perforation,respiratory complication & drug reaction.  The patient agrees with this plan & written consent will be obtained.     Dr Madison Lewis  Speech recognition software was used to dictate the above note.

## 2020-07-20 ENCOUNTER — Inpatient Hospital Stay: Payer: BC Managed Care – PPO

## 2020-07-20 ENCOUNTER — Other Ambulatory Visit: Payer: Self-pay

## 2020-07-20 DIAGNOSIS — D500 Iron deficiency anemia secondary to blood loss (chronic): Secondary | ICD-10-CM

## 2020-07-27 ENCOUNTER — Encounter: Payer: Self-pay | Admitting: Gastroenterology

## 2020-07-27 ENCOUNTER — Telehealth: Payer: Self-pay

## 2020-07-27 NOTE — Telephone Encounter (Signed)
Per ENDO This is a Dr Maximino Greenland pt for 7/26.  She will need to follow up with her cardiologist before coming for her procedure.  She was seen in 08/2019 and told to return for a 3 month check and has not done that yet.  You will need to let her know.  Patient states she will try to call and schedule a follow up appointment before her colonoscopy.If she can not make appointment till after her colonoscopy she will call and reschedule procedure to a later date

## 2020-07-29 NOTE — Telephone Encounter (Signed)
Pt has appt for July 20 with cardiology  Procedure clearance form faxed to Dr Olevia Perches Cardiology

## 2020-07-29 NOTE — Anesthesia Preprocedure Evaluation (Addendum)
Anesthesia Evaluation  Patient identified by MRN, date of birth, ID band Patient awake    Reviewed: Allergy & Precautions, NPO status   Airway Mallampati: II  TM Distance: >3 FB     Dental   Pulmonary sleep apnea (no cpap) ,    Pulmonary exam normal        Cardiovascular hypertension,  Rhythm:Regular Rate:Normal  Hx of ER visit in Aug '21, found to be in A Fib with RVR.  Echo WNL with EF 55%.  Now on metoprolol and diltiazem   Neuro/Psych  Headaches, PSYCHIATRIC DISORDERS Depression    GI/Hepatic   Endo/Other  Morbid obesity (BMI > 40)  Renal/GU      Musculoskeletal  (+) Arthritis ,   Abdominal   Peds  Hematology  (+) anemia ,   Anesthesia Other Findings   Reproductive/Obstetrics                            Anesthesia Physical Anesthesia Plan  ASA: 3  Anesthesia Plan: General   Post-op Pain Management:    Induction: Intravenous  PONV Risk Score and Plan: Propofol infusion, TIVA and Treatment may vary due to age or medical condition  Airway Management Planned: Natural Airway and Nasal Cannula  Additional Equipment:   Intra-op Plan:   Post-operative Plan:   Informed Consent: I have reviewed the patients History and Physical, chart, labs and discussed the procedure including the risks, benefits and alternatives for the proposed anesthesia with the patient or authorized representative who has indicated his/her understanding and acceptance.       Plan Discussed with: CRNA  Anesthesia Plan Comments:         Anesthesia Quick Evaluation

## 2020-07-30 NOTE — Telephone Encounter (Signed)
Medical clearance received via fax from Dr Olevia Perches cardiology stating that pt is cleared to have procedure  Form sent to be scanned

## 2020-08-10 ENCOUNTER — Ambulatory Visit: Payer: BC Managed Care – PPO | Admitting: Anesthesiology

## 2020-08-10 ENCOUNTER — Encounter: Payer: Self-pay | Admitting: Gastroenterology

## 2020-08-10 ENCOUNTER — Ambulatory Visit
Admission: RE | Admit: 2020-08-10 | Discharge: 2020-08-10 | Disposition: A | Discharge status: Home / Self Care | Payer: BC Managed Care – PPO | Attending: Gastroenterology | Admitting: Gastroenterology

## 2020-08-10 ENCOUNTER — Other Ambulatory Visit: Payer: Self-pay

## 2020-08-10 ENCOUNTER — Encounter
Admission: RE | Disposition: A | Discharge status: Home / Self Care | Payer: Self-pay | Source: Home / Self Care | Attending: Gastroenterology

## 2020-08-10 DIAGNOSIS — D509 Iron deficiency anemia, unspecified: Secondary | ICD-10-CM | POA: Diagnosis present

## 2020-08-10 DIAGNOSIS — K317 Polyp of stomach and duodenum: Secondary | ICD-10-CM | POA: Diagnosis not present

## 2020-08-10 DIAGNOSIS — K319 Disease of stomach and duodenum, unspecified: Secondary | ICD-10-CM | POA: Diagnosis not present

## 2020-08-10 DIAGNOSIS — I890 Lymphedema, not elsewhere classified: Secondary | ICD-10-CM | POA: Diagnosis not present

## 2020-08-10 DIAGNOSIS — R1319 Other dysphagia: Secondary | ICD-10-CM | POA: Diagnosis not present

## 2020-08-10 DIAGNOSIS — K648 Other hemorrhoids: Secondary | ICD-10-CM | POA: Diagnosis not present

## 2020-08-10 DIAGNOSIS — K449 Diaphragmatic hernia without obstruction or gangrene: Secondary | ICD-10-CM

## 2020-08-10 DIAGNOSIS — R1310 Dysphagia, unspecified: Secondary | ICD-10-CM | POA: Insufficient documentation

## 2020-08-10 DIAGNOSIS — Z79899 Other long term (current) drug therapy: Secondary | ICD-10-CM | POA: Diagnosis not present

## 2020-08-10 DIAGNOSIS — D500 Iron deficiency anemia secondary to blood loss (chronic): Secondary | ICD-10-CM

## 2020-08-10 DIAGNOSIS — K3189 Other diseases of stomach and duodenum: Secondary | ICD-10-CM

## 2020-08-10 HISTORY — PX: POLYPECTOMY: SHX5525

## 2020-08-10 HISTORY — DX: Paroxysmal atrial fibrillation: I48.0

## 2020-08-10 HISTORY — PX: ESOPHAGOGASTRODUODENOSCOPY (EGD) WITH PROPOFOL: SHX5813

## 2020-08-10 HISTORY — PX: COLONOSCOPY WITH PROPOFOL: SHX5780

## 2020-08-10 LAB — POCT PREGNANCY, URINE: Preg Test, Ur: NEGATIVE

## 2020-08-10 SURGERY — COLONOSCOPY WITH PROPOFOL
Anesthesia: General

## 2020-08-10 MED ORDER — STERILE WATER FOR IRRIGATION IR SOLN
Status: DC | PRN
  Administered 2020-08-10: 150 mL

## 2020-08-10 MED ORDER — SODIUM CHLORIDE 0.9 % IV SOLN: INTRAVENOUS | Status: DC

## 2020-08-10 MED ORDER — GLYCOPYRROLATE 0.2 MG/ML IJ SOLN
INTRAMUSCULAR | Status: DC | PRN
  Administered 2020-08-10: .1 mg via INTRAVENOUS

## 2020-08-10 MED ORDER — ACETAMINOPHEN 325 MG PO TABS: 325.0000 mg | ORAL_TABLET | ORAL | Status: DC | PRN

## 2020-08-10 MED ORDER — ONDANSETRON HCL 4 MG/2ML IJ SOLN
4.0000 mg | Freq: Once | INTRAMUSCULAR | Status: DC | PRN
Start: 2020-08-10 — End: 2020-08-10

## 2020-08-10 MED ORDER — ACETAMINOPHEN 160 MG/5ML PO SOLN: 325.0000 mg | ORAL | Status: DC | PRN

## 2020-08-10 MED ORDER — LACTATED RINGERS IV SOLN: INTRAVENOUS | Status: DC

## 2020-08-10 MED ORDER — PROPOFOL 10 MG/ML IV BOLUS
INTRAVENOUS | Status: DC | PRN
  Administered 2020-08-10 (×3): 20 mg via INTRAVENOUS
  Administered 2020-08-10: 150 mg via INTRAVENOUS
  Administered 2020-08-10: 20 mg via INTRAVENOUS
  Administered 2020-08-10 (×2): 30 mg via INTRAVENOUS
  Administered 2020-08-10 (×6): 20 mg via INTRAVENOUS
  Administered 2020-08-10 (×3): 30 mg via INTRAVENOUS
  Administered 2020-08-10: 40 mg via INTRAVENOUS
  Administered 2020-08-10 (×2): 30 mg via INTRAVENOUS
  Administered 2020-08-10 (×2): 20 mg via INTRAVENOUS

## 2020-08-10 MED ORDER — LIDOCAINE HCL (CARDIAC) PF 100 MG/5ML IV SOSY
PREFILLED_SYRINGE | INTRAVENOUS | Status: DC | PRN
  Administered 2020-08-10: 50 mg via INTRAVENOUS

## 2020-08-10 SURGICAL SUPPLY — 39 items
BALLN DILATOR 10-12 8 (BALLOONS)
BALLN DILATOR 12-15 8 (BALLOONS)
BALLN DILATOR 15-18 8 (BALLOONS)
BALLN DILATOR CRE 0-12 8 (BALLOONS)
BALLN DILATOR ESOPH 8 10 CRE (MISCELLANEOUS) IMPLANT
BALLOON DILATOR 12-15 8 (BALLOONS) IMPLANT
BALLOON DILATOR 15-18 8 (BALLOONS) IMPLANT
BALLOON DILATOR CRE 0-12 8 (BALLOONS) IMPLANT
BLOCK BITE 60FR ADLT L/F GRN (MISCELLANEOUS) ×3 IMPLANT
CLIP HMST 235XBRD CATH ROT (MISCELLANEOUS) IMPLANT
CLIP RESOLUTION 360 11X235 (MISCELLANEOUS)
ELECT REM PT RETURN 9FT ADLT (ELECTROSURGICAL)
ELECTRODE REM PT RTRN 9FT ADLT (ELECTROSURGICAL) IMPLANT
FCP ESCP3.2XJMB 240X2.8X (MISCELLANEOUS)
FORCEPS BIOP RAD 4 LRG CAP 4 (CUTTING FORCEPS) ×3 IMPLANT
FORCEPS BIOP RJ4 240 W/NDL (MISCELLANEOUS)
FORCEPS ESCP3.2XJMB 240X2.8X (MISCELLANEOUS) IMPLANT
GOWN CVR UNV OPN BCK APRN NK (MISCELLANEOUS) ×8 IMPLANT
GOWN ISOL THUMB LOOP REG UNIV (MISCELLANEOUS) ×12
INJECTOR VARIJECT VIN23 (MISCELLANEOUS) IMPLANT
KIT DEFENDO VALVE AND CONN (KITS) IMPLANT
KIT PRC NS LF DISP ENDO (KITS) ×4 IMPLANT
KIT PROCEDURE OLYMPUS (KITS) ×6
MANIFOLD NEPTUNE II (INSTRUMENTS) ×6 IMPLANT
MARKER SPOT ENDO TATTOO 5ML (MISCELLANEOUS) IMPLANT
NEEDLE CARR LOCKE SCLERO (NEEDLE) ×3 IMPLANT
PROBE APC STR FIRE (PROBE) IMPLANT
RETRIEVER NET PLAT FOOD (MISCELLANEOUS) IMPLANT
RETRIEVER NET ROTH 2.5X230 LF (MISCELLANEOUS) IMPLANT
SNARE LASSO HEX 3 IN 1 (INSTRUMENTS) ×3 IMPLANT
SNARE SHORT THROW 13M SML OVAL (MISCELLANEOUS) IMPLANT
SNARE SHORT THROW 30M LRG OVAL (MISCELLANEOUS) IMPLANT
SNARE SNG USE RND 15MM (INSTRUMENTS) IMPLANT
SPOT EX ENDOSCOPIC TATTOO (MISCELLANEOUS)
SYR INFLATION 60ML (SYRINGE) IMPLANT
TRAP ETRAP POLY (MISCELLANEOUS) IMPLANT
VARIJECT INJECTOR VIN23 (MISCELLANEOUS)
WATER STERILE IRR 250ML POUR (IV SOLUTION) ×6 IMPLANT
WIRE CRE 18-20MM 8CM F G (MISCELLANEOUS) IMPLANT

## 2020-08-10 NOTE — Anesthesia Procedure Notes (Signed)
Date/Time: 08/10/2020 10:12 AM Performed by: Maree Krabbe, CRNA Pre-anesthesia Checklist: Patient identified, Emergency Drugs available, Suction available, Timeout performed and Patient being monitored Patient Re-evaluated:Patient Re-evaluated prior to induction Oxygen Delivery Method: Nasal cannula Placement Confirmation: positive ETCO2

## 2020-08-10 NOTE — H&P (Addendum)
Melodie Bouillon, MD [address], [address], [place], [place], [address] [address], [address], [place], [place], [address] Phone: [phone]  Fax: [phone]  Primary Care Physician:  [facility]   Pre-Procedure History & Physical: HPI:  Madison Lewis is a 50 y.o. female is here for a colonoscopy and EGD.   Past Medical History:  Diagnosis Date   Arthritis    Blood transfusion without reported diagnosis    Chickenpox    Depression    Frequent headaches    Headache    History of blood transfusion    Hyperlipidemia    Hypertension    Iron deficiency anemia due to chronic blood loss 07/14/2018   PAF (paroxysmal atrial fibrillation) (HCC)    Sleep apnea    CPAP ordered but can't tolerate it    Past Surgical History:  Procedure Laterality Date   CESAREAN SECTION     COLONOSCOPY WITH PROPOFOL N/A 05/19/2016   Procedure: COLONOSCOPY WITH PROPOFOL;  Surgeon: Midge Minium, MD;  Location: [facility] ENDOSCOPY;  Service: Endoscopy;  Laterality: N/A;   ESOPHAGOGASTRODUODENOSCOPY (EGD) WITH PROPOFOL N/A 05/19/2016   Procedure: ESOPHAGOGASTRODUODENOSCOPY (EGD) WITH PROPOFOL;  Surgeon: Midge Minium, MD;  Location: [facility] ENDOSCOPY;  Service: Endoscopy;  Laterality: N/A;   HYSTEROSCOPY WITH D & C N/A 08/13/2018   Procedure: DILATATION AND CURETTAGE /HYSTEROSCOPY WITH ENDOMETRIAL ABLATION;  Surgeon: Natale Milch, MD;  Location: [facility] ORS;  Service: Gynecology;  Laterality: N/A;   KNEE SURGERY Bilateral    KNEE SURGERY     right and left ACL   TUBAL LIGATION  1997    Prior to Admission medications   Medication Sig Start Date End Date Taking? Authorizing Provider  acetaminophen (TYLENOL) 500 MG tablet Take 1 tablet (500 mg total) by mouth every 6 (six) hours as needed. 07/10/19  Yes Lamptey, Britta Mccreedy, MD  albuterol (VENTOLIN HFA) 108 (90 Base) MCG/ACT inhaler Inhale 2 puffs into the lungs every 4 (four) hours as needed. 02/05/20  Yes [provider]   atorvastatin (LIPITOR) 40 MG tablet Take 1 tablet (40 mg total) by mouth daily. 08/21/19 08/10/20 Yes Gertha Calkin, MD  diltiazem (CARDIZEM) 60 MG tablet Take 1 tablet (60 mg total) by mouth every 6 (six) hours. 08/20/19 08/10/20 Yes Gertha Calkin, MD  ferrous sulfate 325 (65 FE) MG tablet Take 325 mg by mouth daily with breakfast.   Yes [provider]  fluticasone (FLONASE) 50 MCG/ACT nasal spray Place 1 spray into both nostrils 2 (two) times daily. 02/05/20  Yes [provider]  gabapentin (NEURONTIN) 300 MG capsule TAKE 1 CAPSULE BY MOUTH NIGHTLY 09/15/19  Yes [provider]  metoprolol succinate (TOPROL-XL) 100 MG 24 hr tablet Take 1 tablet (100 mg total) by mouth daily. Take with or immediately following a meal. 08/21/19 07/27/20 Yes Gertha Calkin, MD  Multiple Vitamin (MULTIVITAMIN WITH MINERALS) TABS tablet Take 1 tablet by mouth daily. 05/20/16  Yes Gouru, Deanna Artis, MD  traMADol (ULTRAM) 50 MG tablet Take 1 tablet (50 mg total) by mouth every 8 (eight) hours as needed. 01/14/20  Yes Cook, Jayce G, DO  vitamin B-12 (CYANOCOBALAMIN) 1000 MCG tablet Take 1 tablet (1,000 mcg total) by mouth daily. 10/15/18  Yes Rickard Patience, MD  Vitamin D, Ergocalciferol, (DRISDOL) 1.25 MG (50000 UNIT) CAPS capsule Take 50,000 Units by mouth once a week. 07/17/19  Yes [provider]  benzonatate (TESSALON) 200 MG capsule Take 400 mg by mouth every 8 (eight) hours as needed.  Patient not taking: Reported on 07/27/2020 02/05/20   [provider]  methocarbamol (ROBAXIN) 500 MG tablet Take 500 mg by mouth 2 (two) times daily as needed. Patient not taking: Reported on 07/27/2020 09/15/19   [provider]    Allergies as of 07/16/2020   (No Known Allergies)    Family History  Problem Relation Age of Onset   CAD Mother    Diabetes Mother    Colon polyps Mother    Hypertension Father    Pancreatic cancer Father    Cancer Paternal Grandmother        unknown   Cancer Paternal  Grandfather        unknown   Breast cancer Neg Hx     Social History   Socioeconomic History   Marital status: Single    Spouse name: Not on file   Number of children: Not on file   Years of education: Not on file   Highest education level: Not on file  Occupational History   Not on file  Tobacco Use   Smoking status: Never   Smokeless tobacco: Never  Vaping Use   Vaping Use: Never used  Substance and Sexual Activity   Alcohol use: Yes    Alcohol/week: 1.0 standard drink    Types: 1 Cans of beer per week    Comment: occational   Drug use: No   Sexual activity: Not Currently    Birth control/protection: Surgical    Comment: Tubal ligation   Other Topics Concern   Not on file  Social History Narrative   Not on file   Social Determinants of Health   Financial Resource Strain: Not on file  Food Insecurity: Not on file  Transportation Needs: Not on file  Physical Activity: Not on file  Stress: Not on file  Social Connections: Not on file  Intimate Partner Violence: Not on file    Review of Systems: See HPI, otherwise negative ROS  Constitutional: General:   Alert,  Well-developed, well-nourished, pleasant and cooperative in NAD Ht 5\' 4"  (1.626 m)   Wt 110.7 kg   BMI 41.88 kg/m   Head: Normocephalic, atraumatic.   Eyes:  Sclera clear, no icterus.   Conjunctiva pink.   Mouth:  No deformity or lesions, oropharynx pink & moist.  Neck:  Supple, trachea midline  Respiratory: Normal respiratory effort  Gastrointestinal:  Soft, non-tender and non-distended without masses, hepatosplenomegaly or hernias noted.  No guarding or rebound tenderness.     Cardiac: No clubbing or edema.  No cyanosis. Normal posterior tibial pedal pulses noted.  Lymphatic:  No significant cervical adenopathy.  Psych:  Alert and cooperative. Normal mood and affect.  Musculoskeletal:   Symmetrical without gross deformities. 5/5 Lower extremity strength bilaterally.  Skin: Warm.  Intact without significant lesions or rashes. No jaundice.  Neurologic:  Face symmetrical, tongue midline, Normal sensation to touch;  grossly normal neurologically.  Psych:  Alert and oriented x3, Alert and cooperative. Normal mood and affect.  Impression/Plan: Madison Lewis is here for a colonoscopy and EGD for iron deficiency anemia, and dysphagia.   Risks, benefits, limitations, and alternatives regarding the procedures have been reviewed with the patient.  Questions have been answered.  All parties agreeable.   Drexel Iha, MD  08/10/2020, 9:11 AM

## 2020-08-10 NOTE — Transfer of Care (Signed)
Immediate Anesthesia Transfer of Care Note  Patient: Madison Lewis  Procedure(s) Performed: COLONOSCOPY WITH PROPOFOL ESOPHAGOGASTRODUODENOSCOPY (EGD) WITH PROPOFOL  Patient Location: PACU  Anesthesia Type: General  Level of Consciousness: awake, alert  and patient cooperative  Airway and Oxygen Therapy: Patient Spontanous Breathing and Patient connected to supplemental oxygen  Post-op Assessment: Post-op Vital signs reviewed, Patient's Cardiovascular Status Stable, Respiratory Function Stable, Patent Airway and No signs of Nausea or vomiting  Post-op Vital Signs: Reviewed and stable  Complications: No notable events documented.

## 2020-08-10 NOTE — Op Note (Signed)
[facility] G[facility]enter[facility] Patient Name: Madison SonsShaneisaka Lewis Procedure Date: 08/10/2020 10:01 AM MRN: [id number] Account #: [id number] Date of Birth: 09/11/1970 Admit Type: Outpatient Age: [age] [facility] Gender: Female Note Status: Finalized Procedure:             Upper GI endoscopy Indications:           Iron deficiency anemia, Dysphagia Providers:             Toan Mort B. Maximino Greenlandahiliani MD, MD Referring MD:          [facility] [place] (Referring MD) Medicines:             Monitored Anesthesia Care Complications:         No immediate complications. Procedure:             Pre-Anesthesia Assessment:                        - Prior to the procedure, a History and Physical was                         performed, and patient medications, allergies and                         sensitivities were reviewed. The patient's tolerance                         of previous anesthesia was reviewed.                        - The risks and benefits of the procedure and the                         sedation options and risks were discussed with the                         patient. All questions were answered and informed                         consent was obtained.                        - Patient identification and proposed procedure were                         verified prior to the procedure by the physician, the                         nurse, the anesthesiologist, the anesthetist and the                         technician. The procedure was verified in the                         procedure room.                        - ASA Grade Assessment: II - A patient with mild  systemic disease.                        After obtaining informed consent, the endoscope was                         passed under direct vision. Throughout the procedure,                         the patient's blood pressure, pulse, and oxygen                         saturations were  monitored continuously. The Endoscope                         was introduced through the mouth, and advanced to the                         second part of duodenum. The upper GI endoscopy was                         accomplished with ease. The patient tolerated the                         procedure well. Findings:      The examined esophagus was normal. Biopsies were obtained from the       proximal and distal esophagus with cold forceps for histology of       suspected eosinophilic esophagitis.      Patchy mildly erythematous mucosa without bleeding was found in the       gastric antrum. Biopsies were taken with a cold forceps for histology.       Biopsies were obtained in the gastric body, at the incisura and in the       gastric antrum with cold forceps for histology.      A large hiatal hernia was present.      The exam of the stomach was otherwise normal.      Lymphangiectasia was present in the second portion of the duodenum.      A single 4 mm sessile polyp with no bleeding was found in the duodenal       bulb. Biopsies were taken with a cold forceps for histology.      There is no endoscopic evidence of bleeding or angioectasia in the       entire examined duodenum. Biopsies for histology were taken with a cold       forceps for evaluation of celiac disease. Biopsies were done around the       5 to 6 o clock positions to avoid biopsies near the area the ampulla,       which is usually seen around the 11 to 12 o clock positions. The ampulla       was not seen on this exam.      The exam of the duodenum was otherwise normal. Impression:            - Normal esophagus. Biopsied.                        - Erythematous mucosa in the antrum. Biopsied.                        -  Large hiatal hernia.                        - Duodenal mucosal lymphangiectasia.                        - A single duodenal polyp. Biopsied.                        - Biopsies were obtained in the gastric body, at  the                         incisura and in the gastric antrum. Recommendation:        - Await pathology results.                        - [place] surgery Referral for hiatal hernia surgery                        - Discharge patient to home (with escort).                        - Advance diet as tolerated.                        - Continue present medications.                        - Patient has a contact number available for                         emergencies. The signs and symptoms of potential                         delayed complications were discussed with the patient.                         Return to normal activities tomorrow. Written                         discharge instructions were provided to the patient.                        - Discharge patient to home (with escort).                        - The findings and recommendations were discussed with                         the patient.                        - The findings and recommendations were discussed with                         the patient's family.                        - Follow an antireflux regimen. Procedure Code(s):     --- Professional ---  09628, Esophagogastroduodenoscopy, flexible,                         transoral; with biopsy, single or multiple Diagnosis Code(s):     --- Professional ---                        K31.89, Other diseases of stomach and duodenum                        K44.9, Diaphragmatic hernia without obstruction or                         gangrene                        I89.0, Lymphedema, not elsewhere classified                        K31.7, Polyp of stomach and duodenum                        D50.9, Iron deficiency anemia, unspecified                        R13.10, Dysphagia, unspecified CPT copyright 2019 American Medical Association. All rights reserved. The codes documented in this report are preliminary and upon coder review may  be revised to meet current  compliance requirements.  Melodie Bouillon, MD Michel Bickers B. Maximino Greenland MD, MD 08/10/2020 10:28:55 AM This report has been signed electronically. Number of Addenda: 0 Note Initiated On: 08/10/2020 10:01 AM Estimated Blood Loss:  Estimated blood loss: none.      [facility]

## 2020-08-10 NOTE — Anesthesia Postprocedure Evaluation (Signed)
Anesthesia Post Note  Patient: Madison Lewis  Procedure(s) Performed: COLONOSCOPY WITH PROPOFOL POLYPECTOMY ESOPHAGOGASTRODUODENOSCOPY (EGD) WITH PROPOFOL     Patient location during evaluation: PACU Anesthesia Type: General Level of consciousness: awake Pain management: pain level controlled Vital Signs Assessment: post-procedure vital signs reviewed and stable Respiratory status: respiratory function stable Cardiovascular status: stable Postop Assessment: no signs of nausea or vomiting Anesthetic complications: no   No notable events documented.  Jola Babinski

## 2020-08-10 NOTE — Op Note (Signed)
[facility] Gastroenterology Patient Name: Madison Lewis Procedure Date: 08/10/2020 10:01 AM MRN: [id number] Account #: [id number] Date of Birth: May 10, 1970 Admit Type: Outpatient Age: 50 Room: [name] OR ROOM 01 Gender: Female Note Status: Finalized Procedure:             Colonoscopy Indications:           Iron deficiency anemia Providers:             Sherron Mapp B. Maximino Greenland MD, MD Medicines:             Monitored Anesthesia Care Complications:         No immediate complications. Procedure:             Pre-Anesthesia Assessment:                        - Prior to the procedure, a History and Physical was                         performed, and patient medications, allergies and                         sensitivities were reviewed. The patient's tolerance                         of previous anesthesia was reviewed.                        - The risks and benefits of the procedure and the                         sedation options and risks were discussed with the                         patient. All questions were answered and informed                         consent was obtained.                        - Patient identification and proposed procedure were                         verified prior to the procedure by the physician, the                         nurse, the anesthetist and the technician. The                         procedure was verified in the pre-procedure area in                         the procedure room in the endoscopy suite.                        - ASA Grade Assessment: II - A patient with mild                         systemic disease.                        -  After reviewing the risks and benefits, the patient                         was deemed in satisfactory condition to undergo the                         procedure.                        After obtaining informed consent, the colonoscope was                         passed under direct vision.  Throughout the procedure,                         the patient's blood pressure, pulse, and oxygen                         saturations were monitored continuously. The                         Colonoscope was introduced through the anus and                         advanced to the the terminal ileum. The colonoscopy                         was performed with ease. The patient tolerated the                         procedure well. The quality of the bowel preparation                         was good. Findings:      The perianal and digital rectal examinations were normal.      The rectum, sigmoid colon, descending colon, transverse colon, ascending       colon, cecum and ileum appeared normal.      Non-bleeding internal hemorrhoids were found during retroflexion.      No additional abnormalities were found on retroflexion. Impression:            - The rectum, sigmoid colon, descending colon,                         transverse colon, ascending colon and cecum are normal.                        - Non-bleeding internal hemorrhoids.                        - No specimens collected. Recommendation:        - Return to my office as previously scheduled.                        - Discharge patient to home.                        - Resume previous diet.                        -  Continue present medications.                        - Return to primary care physician as previously                         scheduled.                        - The findings and recommendations were discussed with                         the patient.                        - The findings and recommendations were discussed with                         the patient's family. Procedure Code(s):     --- Professional ---                        [phone], Colonoscopy, flexible; diagnostic, including                         collection of specimen(s) by brushing or washing, when                         performed (separate  procedure) Diagnosis Code(s):     --- Professional ---                        K64.8, Other hemorrhoids                        D50.9, Iron deficiency anemia, unspecified CPT copyright 2019 American Medical Association. All rights reserved. The codes documented in this report are preliminary and upon coder review may  be revised to meet current compliance requirements.  Melodie Bouillon, MD Michel Bickers B. Maximino Greenland MD, MD 08/10/2020 10:53:01 AM This report has been signed electronically. Number of Addenda: 0 Note Initiated On: 08/10/2020 10:01 AM Scope Withdrawal Time: 0 hours 13 minutes 22 seconds  Total Procedure Duration: 0 hours 18 minutes 32 seconds       [facility]

## 2020-08-11 ENCOUNTER — Encounter: Payer: Self-pay | Admitting: Gastroenterology

## 2020-08-12 ENCOUNTER — Ambulatory Visit: Payer: BC Managed Care – PPO | Admitting: Oncology

## 2020-08-12 ENCOUNTER — Ambulatory Visit: Payer: BC Managed Care – PPO | Admitting: Gastroenterology

## 2020-08-12 ENCOUNTER — Ambulatory Visit: Payer: BC Managed Care – PPO

## 2020-08-12 ENCOUNTER — Other Ambulatory Visit: Payer: BC Managed Care – PPO

## 2020-08-12 LAB — SURGICAL PATHOLOGY

## 2020-08-17 ENCOUNTER — Inpatient Hospital Stay: Payer: BC Managed Care – PPO | Attending: Oncology

## 2020-08-17 ENCOUNTER — Other Ambulatory Visit: Payer: Self-pay

## 2020-08-17 DIAGNOSIS — D500 Iron deficiency anemia secondary to blood loss (chronic): Secondary | ICD-10-CM | POA: Diagnosis not present

## 2020-08-17 LAB — IRON AND TIBC
Iron: 40 ug/dL (ref 28–170)
Saturation Ratios: 10 % — ABNORMAL LOW (ref 10.4–31.8)
TIBC: 416 ug/dL (ref 250–450)
UIBC: 376 ug/dL

## 2020-08-17 LAB — CBC WITH DIFFERENTIAL/PLATELET
Abs Immature Granulocytes: 0.04 10*3/uL (ref 0.00–0.07)
Basophils Absolute: 0.1 10*3/uL (ref 0.0–0.1)
Basophils Relative: 1 %
Eosinophils Absolute: 0.5 10*3/uL (ref 0.0–0.5)
Eosinophils Relative: 6 %
HCT: 39.7 % (ref 36.0–46.0)
Hemoglobin: 11.5 g/dL — ABNORMAL LOW (ref 12.0–15.0)
Immature Granulocytes: 1 %
Lymphocytes Relative: 21 %
Lymphs Abs: 1.7 10*3/uL (ref 0.7–4.0)
MCH: 23.1 pg — ABNORMAL LOW (ref 26.0–34.0)
MCHC: 29 g/dL — ABNORMAL LOW (ref 30.0–36.0)
MCV: 79.9 fL — ABNORMAL LOW (ref 80.0–100.0)
Monocytes Absolute: 0.3 10*3/uL (ref 0.1–1.0)
Monocytes Relative: 4 %
Neutro Abs: 5.6 10*3/uL (ref 1.7–7.7)
Neutrophils Relative %: 67 %
Platelets: 259 10*3/uL (ref 150–400)
RBC: 4.97 MIL/uL (ref 3.87–5.11)
RDW: 21.9 % — ABNORMAL HIGH (ref 11.5–15.5)
WBC: 8.2 10*3/uL (ref 4.0–10.5)
nRBC: 0 % (ref 0.0–0.2)

## 2020-08-17 LAB — FERRITIN: Ferritin: 8 ng/mL — ABNORMAL LOW (ref 11–307)

## 2020-08-19 ENCOUNTER — Encounter: Payer: Self-pay | Admitting: Oncology

## 2020-08-19 ENCOUNTER — Inpatient Hospital Stay: Payer: BC Managed Care – PPO

## 2020-08-19 ENCOUNTER — Other Ambulatory Visit: Payer: Self-pay

## 2020-08-19 ENCOUNTER — Inpatient Hospital Stay (HOSPITAL_BASED_OUTPATIENT_CLINIC_OR_DEPARTMENT_OTHER): Payer: BC Managed Care – PPO | Admitting: Oncology

## 2020-08-19 VITALS — Wt 246.3 lb

## 2020-08-19 VITALS — BP 155/89 | HR 72 | Resp 18

## 2020-08-19 DIAGNOSIS — D500 Iron deficiency anemia secondary to blood loss (chronic): Secondary | ICD-10-CM

## 2020-08-19 DIAGNOSIS — K449 Diaphragmatic hernia without obstruction or gangrene: Secondary | ICD-10-CM

## 2020-08-19 MED ORDER — IRON SUCROSE 20 MG/ML IV SOLN
200.0000 mg | Freq: Once | INTRAVENOUS | Status: AC
  Administered 2020-08-19: 200 mg via INTRAVENOUS

## 2020-08-19 MED ORDER — SODIUM CHLORIDE 0.9 % IV SOLN: 200.0000 mg | Freq: Once | INTRAVENOUS | Status: DC

## 2020-08-19 MED ORDER — SODIUM CHLORIDE 0.9 % IV SOLN
Freq: Once | INTRAVENOUS | Status: AC
  Filled 2020-08-19: qty 250

## 2020-08-19 NOTE — Progress Notes (Signed)
Hematology/Oncology follow up  note [name] [phone] [phone]   Patient Care Team: [name] as PCP - General [name], MD as Consulting Physician (Oncology)  REFERRING PROVIDER: [name]*  CHIEF COMPLAINTS/REASON FOR VISIT:  Follow up for  anemia  HISTORY OF PRESENTING ILLNESS:  Madison Lewis is a  50 y.o.  female with PMH listed below was seen in consultation at the request of  [name], [name], MD  for evaluation of anemia.   Reviewed patient's recent labs 07/06/2018 Labs revealed anemia with hemoglobin of 6.6, MCV 76.1.  Platelet count 522. Patient was advised by Dr. [name] to go to emergency room.  She had hospitalization from 07/05/2020 07/07/2018. She received 2 units of packed RBC transfusion, received IV iron during admission as well. Pelvic ultrasound showed mild endometrial thickening Anemia work-up consistent with iron deficiency anemia and low B12 level. She also received a B12 injection during admission.  Today patient reports feeling slightly better.  Fatigue has improved after recent blood transfusion and IV Venofer 421m x1. Patient craves for ice.  Associated signs and symptoms: Patient reports fatigue.  SOB with exertion.  Denies weight loss, easy bruising, hematochezia, hemoptysis, hematuria. Context: History of GI bleeding: Denies               History of Chronic kidney disease; CKD               History of autoimmune disease denies               History of hemolytic anemia.  Denies               Last colonoscopy: Colonoscopy and upper endoscopy 05/19/2016 showed nonbleeding internal hemorrhoids and hiatal hernia. No menses after endometrial ablation.  # hospitalized due to atrial fibrillation with RVR, she has a low chads score of 2 and due to her anemia, cardiology held anticoagulation. # Intermittently she has blood in the stool after eating spicy food, she  attributes to hemorrhoid bleeding. Last colonoscopy was done in 2018 by Dr. [name] INTERVAL HISTORY SModest Draegeris a 50y.o. female who has above history reviewed by me today presents for follow up visit for management of anemia Problems and complaints are listed below: Fatigue has improved. Insomnia, has tried melatonin with not much help. Patient has had endoscopy done on July 26, after that she felt some throat discomfort.   Review of Systems  Constitutional:  Positive for fatigue. Negative for appetite change, chills and fever.  HENT:   Negative for hearing loss and voice change.   Eyes:  Negative for eye problems.  Respiratory:  Negative for chest tightness, cough and shortness of breath.   Cardiovascular:  Negative for chest pain.  Gastrointestinal:  Negative for abdominal distention, abdominal pain and blood in stool.  Endocrine: Negative for hot flashes.  Genitourinary:  Negative for difficulty urinating and frequency.   Musculoskeletal:  Negative for arthralgias.  Skin:  Negative for itching and rash.  Neurological:  Negative for extremity weakness.  Hematological:  Negative for adenopathy.  Psychiatric/Behavioral:  Positive for sleep disturbance. Negative for confusion.    MEDICAL HISTORY:  Past Medical History:  Diagnosis Date   Arthritis    Blood transfusion without reported diagnosis    Chickenpox    Depression    Frequent headaches    Headache    History of blood transfusion    Hyperlipidemia    Hypertension  Iron deficiency anemia due to chronic blood loss 07/14/2018   PAF (paroxysmal atrial fibrillation) (HCC)    Sleep apnea    CPAP ordered but can't tolerate it    SURGICAL HISTORY: Past Surgical History:  Procedure Laterality Date   CESAREAN SECTION     COLONOSCOPY WITH PROPOFOL N/A 05/19/2016   Procedure: COLONOSCOPY WITH PROPOFOL;  Surgeon: [name], MD;  Location: [facility];  Service: Endoscopy;  Laterality: N/A;    COLONOSCOPY WITH PROPOFOL N/A 08/10/2020   Procedure: COLONOSCOPY WITH PROPOFOL;  Surgeon: [name], MD;  Location: [place];  Service: Endoscopy;  Laterality: N/A;   ESOPHAGOGASTRODUODENOSCOPY (EGD) WITH PROPOFOL N/A 05/19/2016   Procedure: ESOPHAGOGASTRODUODENOSCOPY (EGD) WITH PROPOFOL;  Surgeon: [name], MD;  Location: [facility];  Service: Endoscopy;  Laterality: N/A;   ESOPHAGOGASTRODUODENOSCOPY (EGD) WITH PROPOFOL N/A 08/10/2020   Procedure: ESOPHAGOGASTRODUODENOSCOPY (EGD) WITH PROPOFOL;  Surgeon: [name], MD;  Location: [place];  Service: Endoscopy;  Laterality: N/A;  sleep apnea   HYSTEROSCOPY WITH D & C N/A 08/13/2018   Procedure: DILATATION AND CURETTAGE /HYSTEROSCOPY WITH ENDOMETRIAL ABLATION;  Surgeon: [name], MD;  Location: [facility];  Service: Gynecology;  Laterality: N/A;   KNEE SURGERY Bilateral    KNEE SURGERY     right and left ACL   POLYPECTOMY  08/10/2020   Procedure: POLYPECTOMY;  Surgeon: [name], MD;  Location: [place];  Service: Endoscopy;;   TUBAL LIGATION  1997    SOCIAL HISTORY: Social History   Socioeconomic History   Marital status: Single    Spouse name: Not on file   Number of children: Not on file   Years of education: Not on file   Highest education level: Not on file  Occupational History   Not on file  Tobacco Use   Smoking status: Never   Smokeless tobacco: Never  Vaping Use   Vaping Use: Never used  Substance and Sexual Activity   Alcohol use: Yes    Alcohol/week: 1.0 standard drink    Types: 1 Cans of beer per week    Comment: occational   Drug use: No   Sexual activity: Not Currently    Birth control/protection: Surgical    Comment: Tubal ligation   Other Topics Concern   Not on file  Social History Narrative   Not on file   Social Determinants of Health   Financial Resource Strain: Not on file  Food Insecurity: Not on file  Transportation  Needs: Not on file  Physical Activity: Not on file  Stress: Not on file  Social Connections: Not on file  Intimate Partner Violence: Not on file    FAMILY HISTORY: Family History  Problem Relation Age of Onset   CAD Mother    Diabetes Mother    Colon polyps Mother    Hypertension Father    Pancreatic cancer Father    Cancer Paternal Grandmother        unknown   Cancer Paternal Grandfather        unknown   Breast cancer Neg Hx     ALLERGIES:  has No Known Allergies.  MEDICATIONS:  Current Outpatient Medications  Medication Sig Dispense Refill   acetaminophen (TYLENOL) 500 MG tablet Take 1 tablet (500 mg total) by mouth every 6 (six) hours as needed. 30 tablet 0   albuterol (VENTOLIN HFA) 108 (90 Base) MCG/ACT inhaler Inhale 2 puffs into the lungs every 4 (four) hours as needed.  atorvastatin (LIPITOR) 40 MG tablet Take 1 tablet (40 mg total) by mouth daily. 30 tablet 0   diltiazem (CARDIZEM) 120 MG tablet Take by mouth.     ferrous sulfate 325 (65 FE) MG tablet Take 325 mg by mouth daily with breakfast.     fluticasone (FLONASE) 50 MCG/ACT nasal spray Place 1 spray into both nostrils 2 (two) times daily.     fluticasone (FLONASE) 50 MCG/ACT nasal spray Place into the nose.     gabapentin (NEURONTIN) 300 MG capsule TAKE 1 CAPSULE BY MOUTH NIGHTLY     metoprolol succinate (TOPROL-XL) 100 MG 24 hr tablet Take 1 tablet (100 mg total) by mouth daily. Take with or immediately following a meal. 30 tablet 0   Multiple Vitamin (MULTIVITAMIN WITH MINERALS) TABS tablet Take 1 tablet by mouth daily.     SUPREP BOWEL PREP KIT 17.5-3.13-1.6 GM/177ML SOLN SMARTSIG:1 Kit(s) By Mouth Once     vitamin B-12 (CYANOCOBALAMIN) 1000 MCG tablet Take 1 tablet (1,000 mcg total) by mouth daily. 90 tablet 3   Vitamin D, Ergocalciferol, (DRISDOL) 1.25 MG (50000 UNIT) CAPS capsule Take 50,000 Units by mouth once a week.     benzonatate (TESSALON) 200 MG capsule Take 400 mg by mouth every 8 (eight) hours  as needed. (Patient not taking: No sig reported)     diltiazem (CARDIZEM) 60 MG tablet Take 1 tablet (60 mg total) by mouth every 6 (six) hours. (Patient not taking: Reported on 08/19/2020) 120 tablet 0   methocarbamol (ROBAXIN) 500 MG tablet Take 500 mg by mouth 2 (two) times daily as needed. (Patient not taking: No sig reported)     traMADol (ULTRAM) 50 MG tablet Take 1 tablet (50 mg total) by mouth every 8 (eight) hours as needed. (Patient not taking: Reported on 08/19/2020) 15 tablet 0   No current facility-administered medications for this visit.     PHYSICAL EXAMINATION: ECOG PERFORMANCE STATUS: 1 - Symptomatic but completely ambulatory There were no vitals filed for this visit.  Filed Weights   08/19/20 1327  Weight: 246 lb 4.1 oz (111.7 kg)    Physical Exam Constitutional:      General: She is not in acute distress. HENT:     Head: Normocephalic and atraumatic.  Eyes:     General: No scleral icterus.    Pupils: Pupils are equal, round, and reactive to light.  Cardiovascular:     Rate and Rhythm: Normal rate and regular rhythm.     Heart sounds: Normal heart sounds.  Pulmonary:     Effort: Pulmonary effort is normal. No respiratory distress.     Breath sounds: No wheezing.  Abdominal:     General: Bowel sounds are normal. There is no distension.     Palpations: Abdomen is soft. There is no mass.     Tenderness: There is no abdominal tenderness.  Musculoskeletal:        General: No deformity. Normal range of motion.     Cervical back: Normal range of motion and neck supple.  Skin:    General: Skin is warm and dry.     Findings: No erythema or rash.  Neurological:     Mental Status: She is alert and oriented to person, place, and time.     Cranial Nerves: No cranial nerve deficit.     Coordination: Coordination normal.  Psychiatric:        Behavior: Behavior normal.        Thought Content: Thought content normal.  LABORATORY DATA:  I have reviewed the data as  listed Lab Results  Component Value Date   WBC 8.2 08/17/2020   HGB 11.5 (L) 08/17/2020   HCT 39.7 08/17/2020   MCV 79.9 (L) 08/17/2020   PLT 259 08/17/2020   Recent Labs    09/05/19 1109  PROT 7.4  ALBUMIN 3.6  AST 25  ALT 31  ALKPHOS 85  BILITOT 0.3  BILIDIR 0.1  IBILI 0.2*    Iron/TIBC/Ferritin/ %Sat    Component Value Date/Time   IRON 40 08/17/2020 1259   TIBC 416 08/17/2020 1259   FERRITIN 8 (L) 08/17/2020 1259   IRONPCTSAT 10 (L) 08/17/2020 1259      #07/10/2018 hemoglobinopathy evaluation reviewed normal.  ASSESSMENT & PLAN:  1. Iron deficiency anemia due to chronic blood loss   2. Hiatal hernia    #Iron deficiency anemia, Status post IV Venofer treatments.   Labs reviewed and discussed with patient. Hemoglobin has improved to 11.5. Iron panel shows improved iron store, persistent iron deficiency.  Ferritin 8, iron saturation 10. Recommend patient to proceed with IV Venofer treatment every 2 weeks x3  08/10/2020, EGD and a colonoscopy showed hiatal hernia, erythematous mucosa in the antrum. Duodenal mucosal lymphangiectasia, single duodenal polyp biopsied.-Pathology was negative for dysplasia and malignancy.  Negative for H. Pylori.  Esophagus biopsy showed patchy and occasional eosinophils. Colonoscopy showed nonbleeding internal hemorrhoid.  No specimen was collected. Continue follow-up with with gastroenterology.  Throat discomfort may be secondary to recent upper endoscopy.  Follow-up in 3 months hiatal hernia s.  Lab MD +/- Venofer. All questions were answered. The patient knows to call the clinic with any problems questions or concerns.    [name], MD, PhD  08/19/2020

## 2020-08-19 NOTE — Progress Notes (Signed)
pt states she is having difficulty sleeping; both going and staying asleep throughout the night. Has tried melatonin before but does not seem to help. Also, ever since her endoscopy on July 26th, pt has had a terrible cough; has not been taking anything because she is not sure what it is. "Very irritating, throat not sore."

## 2020-08-20 ENCOUNTER — Other Ambulatory Visit: Payer: Self-pay | Admitting: Gastroenterology

## 2020-08-20 DIAGNOSIS — D500 Iron deficiency anemia secondary to blood loss (chronic): Secondary | ICD-10-CM

## 2020-09-02 ENCOUNTER — Inpatient Hospital Stay: Payer: BC Managed Care – PPO

## 2020-09-09 ENCOUNTER — Encounter: Admission: RE | Payer: Self-pay | Source: Home / Self Care

## 2020-09-09 ENCOUNTER — Ambulatory Visit
Admission: RE | Admit: 2020-09-09 | Payer: BC Managed Care – PPO | Source: Home / Self Care | Admitting: Gastroenterology

## 2020-09-09 SURGERY — IMAGING PROCEDURE, GI TRACT, INTRALUMINAL, VIA CAPSULE

## 2020-09-16 ENCOUNTER — Inpatient Hospital Stay: Payer: BC Managed Care – PPO | Attending: Oncology

## 2020-09-30 ENCOUNTER — Ambulatory Visit: Payer: BC Managed Care – PPO | Admitting: Gastroenterology

## 2020-09-30 ENCOUNTER — Encounter: Payer: Self-pay | Admitting: Gastroenterology

## 2020-10-19 ENCOUNTER — Other Ambulatory Visit: Payer: Self-pay | Admitting: Surgery

## 2020-10-19 DIAGNOSIS — D508 Other iron deficiency anemias: Secondary | ICD-10-CM

## 2020-10-19 DIAGNOSIS — K219 Gastro-esophageal reflux disease without esophagitis: Secondary | ICD-10-CM

## 2020-10-26 ENCOUNTER — Ambulatory Visit
Admission: RE | Admit: 2020-10-26 | Discharge: 2020-10-26 | Disposition: A | Discharge status: Home / Self Care | Payer: BC Managed Care – PPO | Source: Ambulatory Visit | Attending: Surgery | Admitting: Surgery

## 2020-10-26 ENCOUNTER — Other Ambulatory Visit: Payer: Self-pay

## 2020-10-26 DIAGNOSIS — D508 Other iron deficiency anemias: Secondary | ICD-10-CM

## 2020-10-26 DIAGNOSIS — K219 Gastro-esophageal reflux disease without esophagitis: Secondary | ICD-10-CM

## 2020-11-16 ENCOUNTER — Other Ambulatory Visit: Payer: BC Managed Care – PPO

## 2020-11-18 ENCOUNTER — Inpatient Hospital Stay: Payer: BC Managed Care – PPO

## 2020-11-18 ENCOUNTER — Inpatient Hospital Stay: Payer: BC Managed Care – PPO | Admitting: Oncology

## 2020-11-18 ENCOUNTER — Telehealth: Payer: Self-pay | Admitting: Oncology

## 2020-11-18 ENCOUNTER — Other Ambulatory Visit: Payer: Self-pay

## 2020-11-18 DIAGNOSIS — D500 Iron deficiency anemia secondary to blood loss (chronic): Secondary | ICD-10-CM

## 2020-11-18 NOTE — Telephone Encounter (Signed)
Pt called in to cancel her appt. She is sick. Please call to reschedule at [phone]

## 2020-11-23 ENCOUNTER — Inpatient Hospital Stay: Payer: BC Managed Care – PPO

## 2020-11-24 NOTE — Progress Notes (Signed)
Sent message, via epic in basket, requesting orders in epic from surgeon.  

## 2020-11-25 IMAGING — US US PELVIS COMPLETE
1 series · 14 of 25 positions shown · non-contrast
Comparison: None

CLINICAL DATA: Heavy menses.

EXAM:
TRANSABDOMINAL AND TRANSVAGINAL ULTRASOUND OF PELVIS
TECHNIQUE: Both transabdominal and transvaginal ultrasound examinations of the
pelvis were performed. Transabdominal technique was performed for
global imaging of the pelvis including uterus, ovaries, adnexal
regions, and pelvic cul-de-sac. It was necessary to proceed with
endovaginal exam following the transabdominal exam to visualize the
endometrium and ovaries.

[Series 1: us pelvis complete · 14 of 51 slices shown]
[im 1/51]
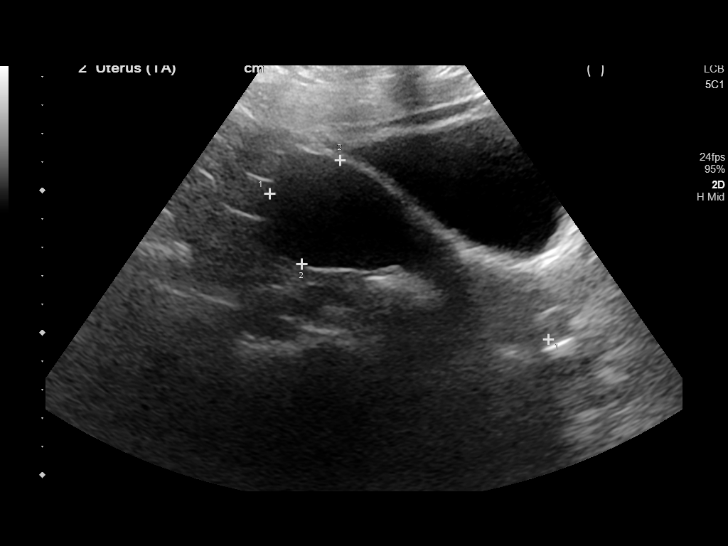
[im 5/51]
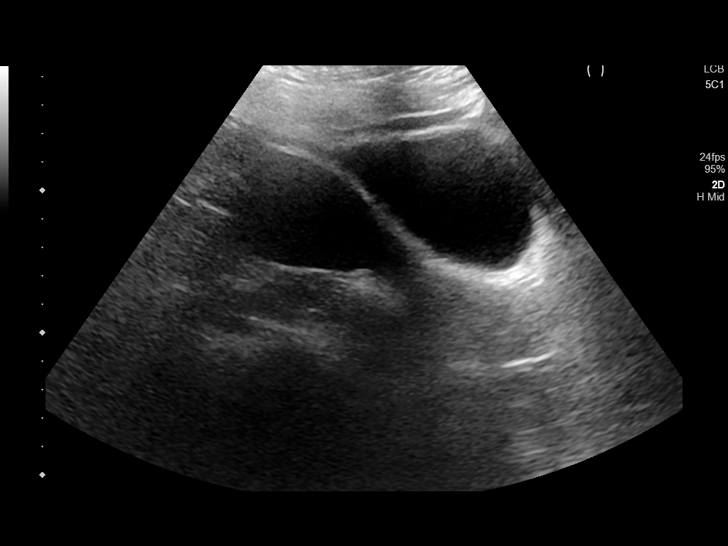
[im 9/51]
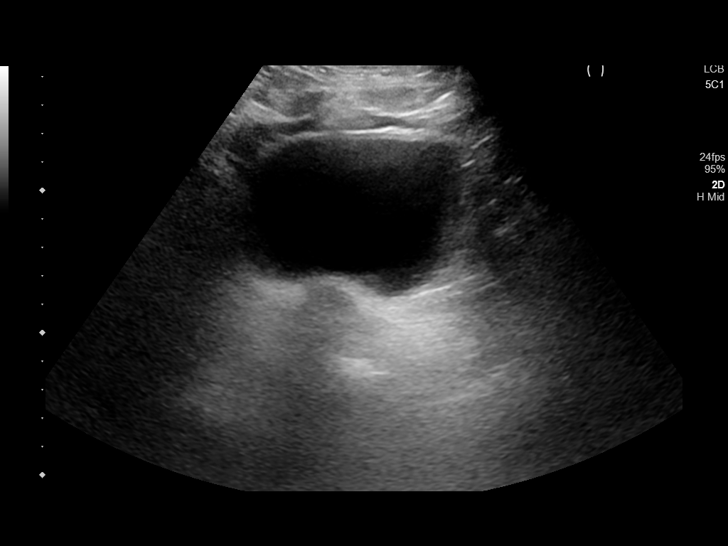
[im 13/51]
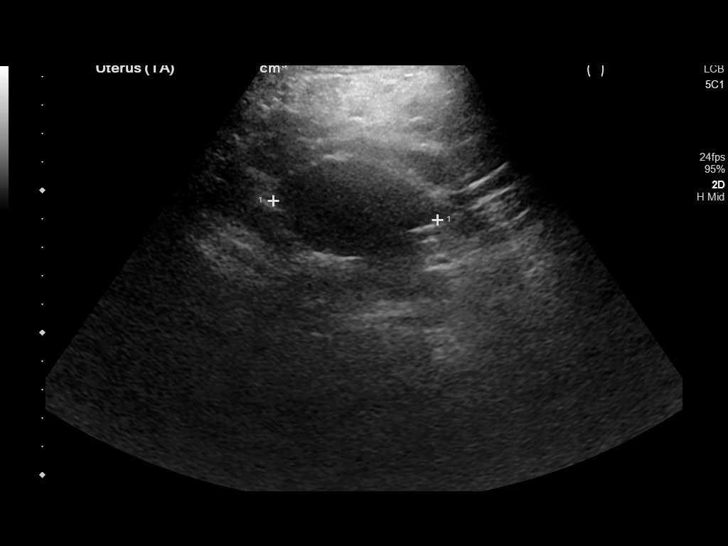
[im 17/51]
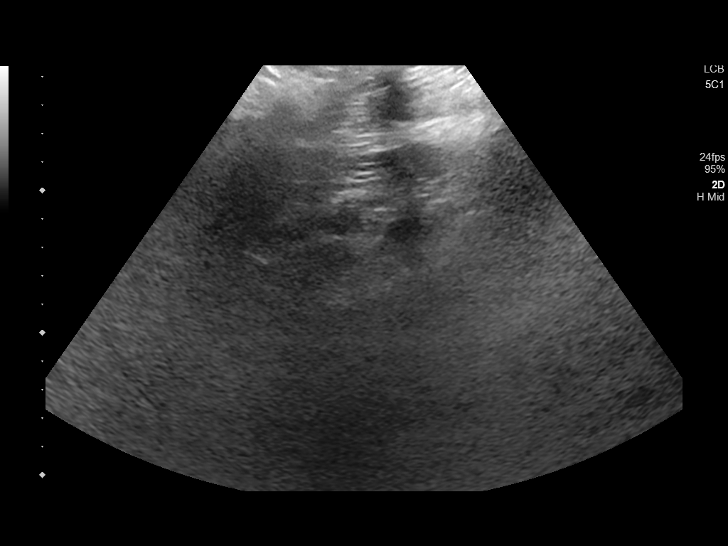
[im 19/51]
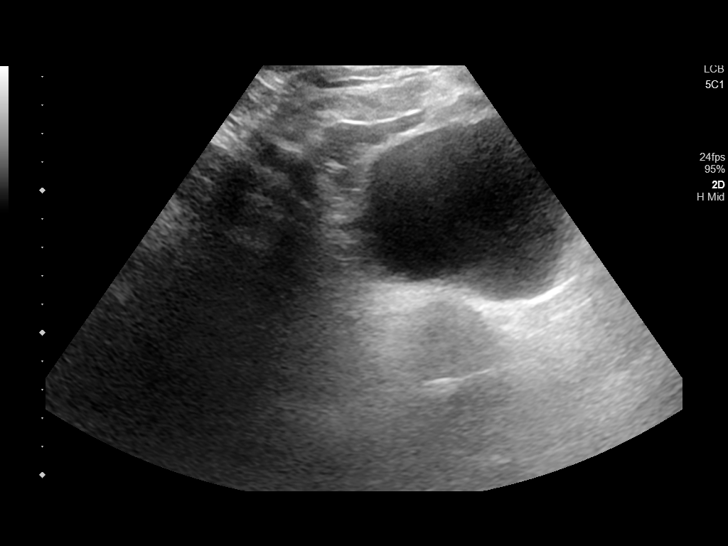
[im 23/51]
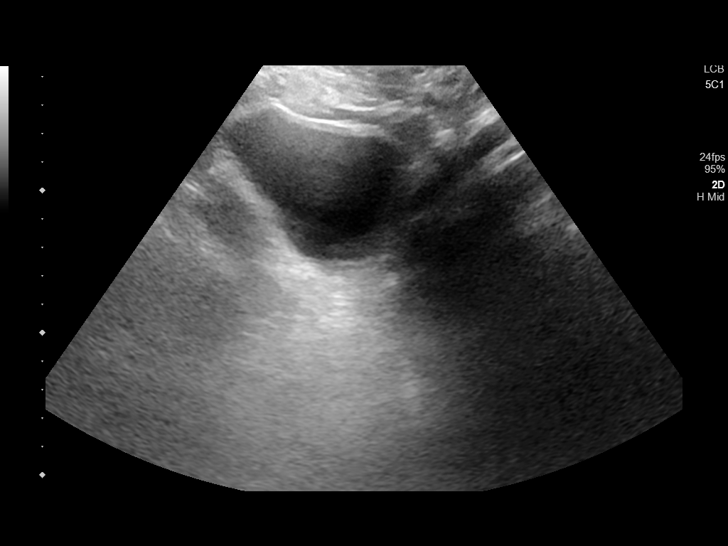
[im 28/51]
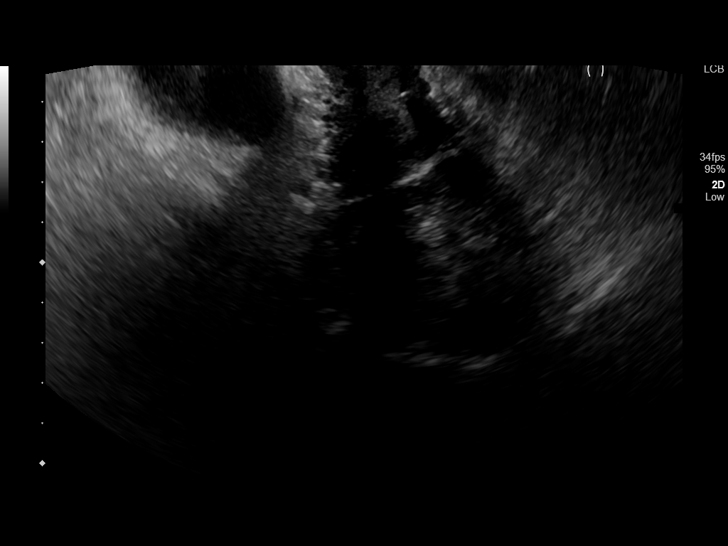
[im 32/51]
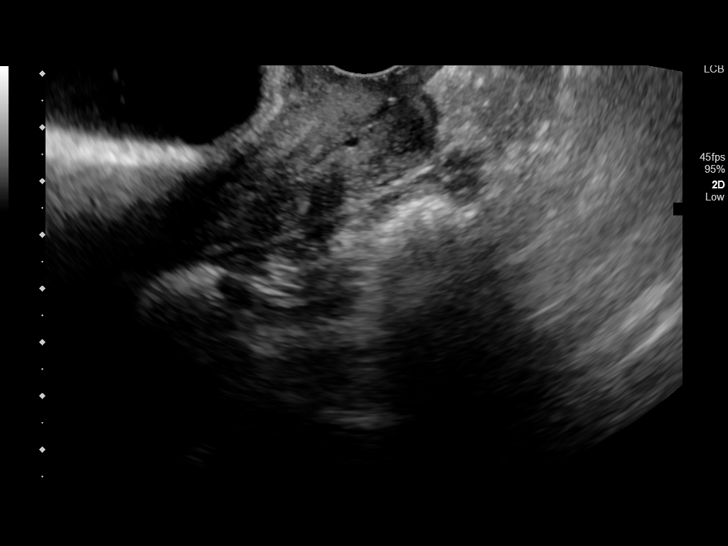
[im 34/51]
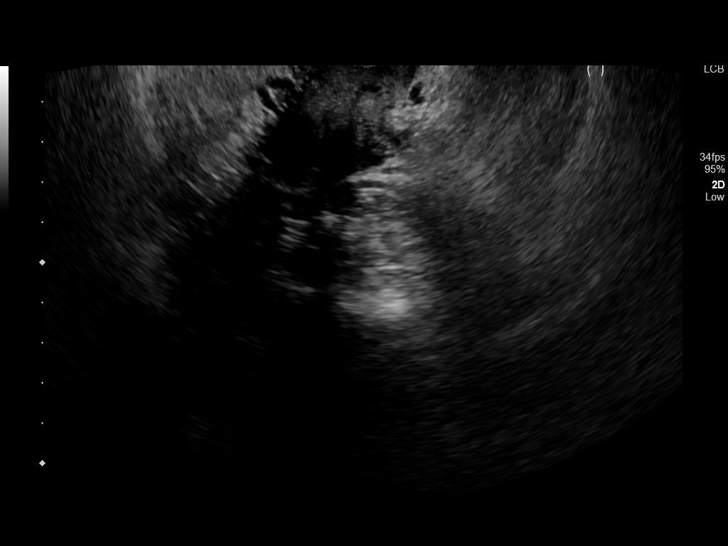
[im 38/51]
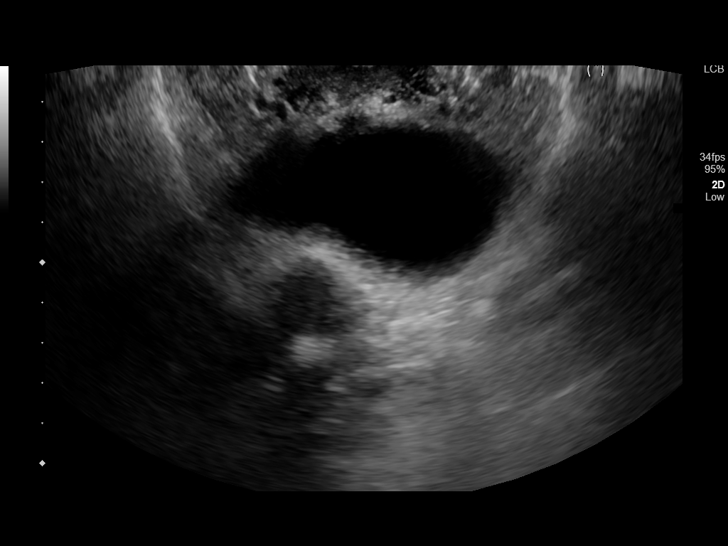
[im 42/51]
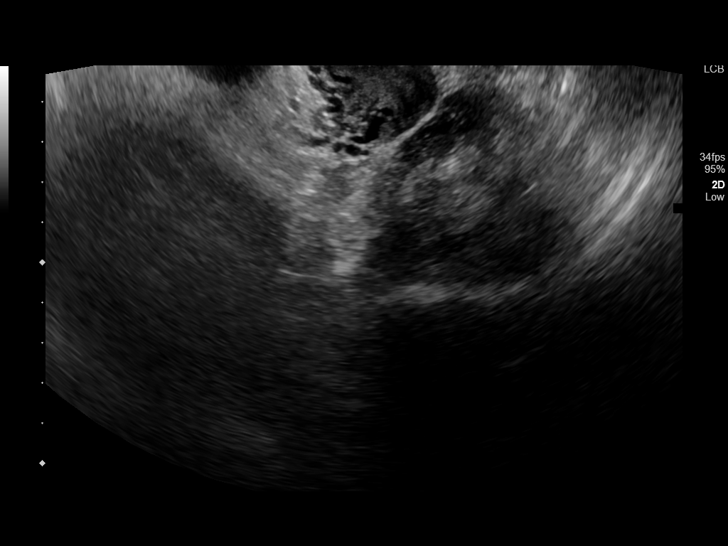
[im 46/51]
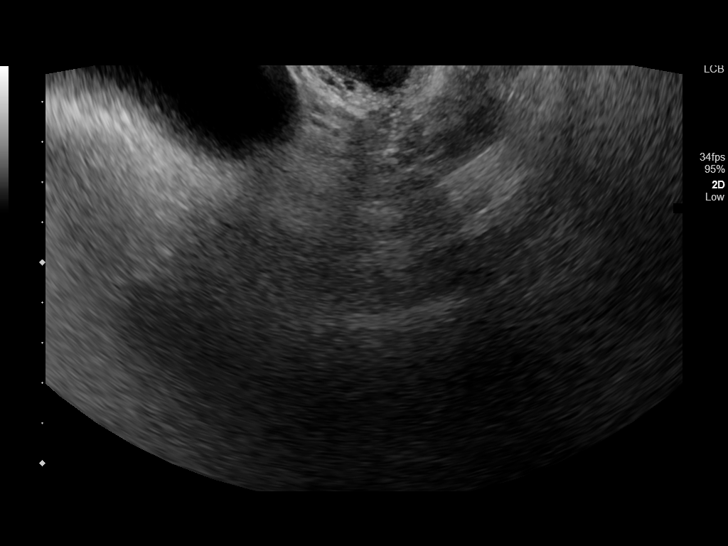
[im 51/51]
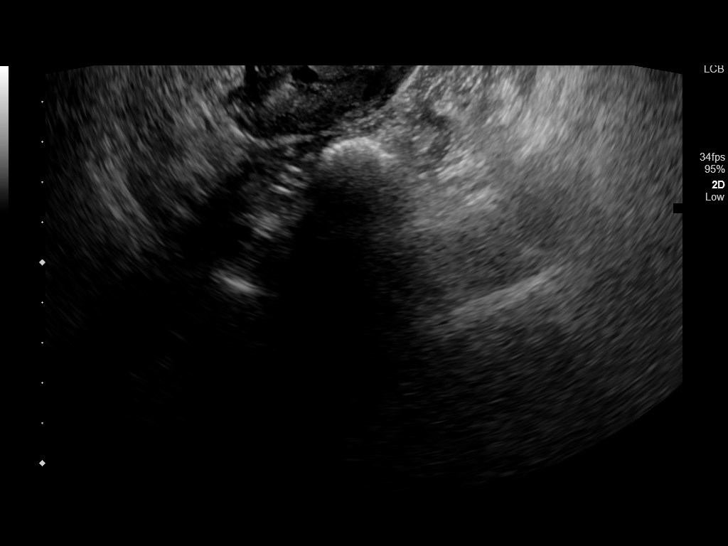

[14 of 25 positions shown; findings below may reference images not displayed]

FINDINGS: Uterus

Measurements: 11.0 x 3.9 x 5.8 cm = volume: 131 mL. No fibroids or
other mass visualized. Few nabothian cyst present.

Endometrium

Thickness: 8.2 mm.  No focal abnormality visualized.

Right ovary

Not visualized due to overlying bowel gas.

Left ovary

Not visualized due to overlying bowel gas.

Other findings

No significant free pelvic fluid.
IMPRESSION: Mild endometrial thickening in this perimenopausal patient with
bleeding. Recommend GYN consultation.

## 2020-11-30 ENCOUNTER — Inpatient Hospital Stay: Payer: BC Managed Care – PPO | Attending: Oncology

## 2020-11-30 ENCOUNTER — Other Ambulatory Visit: Payer: Self-pay

## 2020-11-30 DIAGNOSIS — D500 Iron deficiency anemia secondary to blood loss (chronic): Secondary | ICD-10-CM | POA: Insufficient documentation

## 2020-11-30 LAB — CBC WITH DIFFERENTIAL/PLATELET
Abs Immature Granulocytes: 0.02 10*3/uL (ref 0.00–0.07)
Basophils Absolute: 0.1 10*3/uL (ref 0.0–0.1)
Basophils Relative: 1 %
Eosinophils Absolute: 0.4 10*3/uL (ref 0.0–0.5)
Eosinophils Relative: 6 %
HCT: 39 % (ref 36.0–46.0)
Hemoglobin: 11.9 g/dL — ABNORMAL LOW (ref 12.0–15.0)
Immature Granulocytes: 0 %
Lymphocytes Relative: 27 %
Lymphs Abs: 1.7 10*3/uL (ref 0.7–4.0)
MCH: 25.9 pg — ABNORMAL LOW (ref 26.0–34.0)
MCHC: 30.5 g/dL (ref 30.0–36.0)
MCV: 84.8 fL (ref 80.0–100.0)
Monocytes Absolute: 0.4 10*3/uL (ref 0.1–1.0)
Monocytes Relative: 6 %
Neutro Abs: 3.8 10*3/uL (ref 1.7–7.7)
Neutrophils Relative %: 60 %
Platelets: 286 10*3/uL (ref 150–400)
RBC: 4.6 MIL/uL (ref 3.87–5.11)
RDW: 17.8 % — ABNORMAL HIGH (ref 11.5–15.5)
WBC: 6.4 10*3/uL (ref 4.0–10.5)
nRBC: 0 % (ref 0.0–0.2)

## 2020-11-30 LAB — IRON AND TIBC
Iron: 36 ug/dL (ref 28–170)
Saturation Ratios: 8 % — ABNORMAL LOW (ref 10.4–31.8)
TIBC: 430 ug/dL (ref 250–450)
UIBC: 394 ug/dL

## 2020-11-30 LAB — FERRITIN: Ferritin: 9 ng/mL — ABNORMAL LOW (ref 11–307)

## 2020-12-01 ENCOUNTER — Ambulatory Visit: Payer: BC Managed Care – PPO

## 2020-12-01 ENCOUNTER — Ambulatory Visit: Payer: BC Managed Care – PPO | Admitting: Oncology

## 2020-12-01 NOTE — Patient Instructions (Signed)
DUE TO COVID-19 ONLY ONE VISITOR IS ALLOWED TO COME WITH YOU AND STAY IN THE WAITING ROOM ONLY DURING PRE OP AND PROCEDURE DAY OF SURGERY IF YOU ARE GOING HOME AFTER SURGERY. IF YOU ARE SPENDING THE NIGHT 2 PEOPLE MAY VISIT WITH YOU IN YOUR PRIVATE ROOM AFTER SURGERY UNTIL VISITING  HOURS ARE OVER AT 8:00 PM AND 1  VISITOR  MAY  SPEND THE NIGHT.   YOU NEED TO HAVE A COVID 19 TEST ON__11/30__ THIS TEST MUST BE DONE BEFORE SURGERY,  COVID TESTING SITE  IS LOCATED AT [address], [place]. REMAIN IN YOUR CAR THIS IS A DRIVE UP TEST. AFTER YOUR COVID TEST PLEASE WEAR A MASK OUT IN PUBLIC AND SOCIAL DISTANCE AND WASH YOUR HANDS FREQUENTLY, ALSO ASK ALL YOUR CLOSE CONTACT PERSONS TO WEAR A MASK AND SOCIAL DISTANCE AND WASH THEIR HANDS FREQUENTLY ALSO.               Drexel Iha     Your procedure is scheduled on: 12/17/20   Report to [facility] Main  Entrance   Report to admitting at   8:15 AM     Call this number if you have problems the morning of surgery [phone]    Remember: Do not eat food or drink :After Midnight the night before your surgery,    Follow any bowel prep instructions and diet from Dr. Ermalene Searing office.   Drink plenty of liquids on the day of prep to prevent dehydration      BRUSH YOUR TEETH MORNING OF SURGERY AND RINSE YOUR MOUTH OUT, NO CHEWING GUM CANDY OR MINTS.     Take these medicines the morning of surgery with A SIP OF WATER: Diltiazem, Use your inhaler and bring it with you.  Bring your mask and tubing to the hospital                                 You may not have any metal on your body including hair pins and              piercings  Do not wear jewelry, make-up, lotions, powders or perfumes, deodorant             Do not wear nail polish on your fingernails.  Do not shave  48 hours prior to surgery.               Do not bring valuables to the hospital. [facility] IS NOT             RESPONSIBLE   FOR  VALUABLES.  Contacts, dentures or bridgework may not be worn into surgery.  Leave suitcase in the car. After surgery it may be brought to your room.   _____________________________________________________________________             [facility] - Preparing for Surgery Before surgery, you can play an important role.  Because skin is not sterile, your skin needs to be as free of germs as possible.  You can reduce the number of germs on your skin by washing with CHG (chlorahexidine gluconate) soap before surgery.  CHG is an antiseptic cleaner which kills germs and bonds with the skin to continue killing germs even after washing. Please DO NOT use if you have an allergy to CHG or antibacterial soaps.  If your skin becomes reddened/irritated stop using the CHG and inform your nurse  when you arrive at Short Stay. Do not shave (including legs and underarms) for at least 48 hours prior to the first CHG shower.   Please follow these instructions carefully:  1.  Shower with CHG Soap the night before surgery and the  morning of Surgery.  2.  If you choose to wash your hair, wash your hair first as usual with your  normal  shampoo.  3.  After you shampoo, rinse your hair and body thoroughly to remove the  shampoo.                            4.  Use CHG as you would any other liquid soap.  You can apply chg directly  to the skin and wash                       Gently with a scrungie or clean washcloth.  5.  Apply the CHG Soap to your body ONLY FROM THE NECK DOWN.   Do not use on face/ open                           Wound or open sores. Avoid contact with eyes, ears mouth and genitals (private parts).                       Wash face,  Genitals (private parts) with your normal soap.             6.  Wash thoroughly, paying special attention to the area where your surgery  will be performed.  7.  Thoroughly rinse your body with warm water from the neck down.  8.  DO NOT shower/wash with your normal soap after  using and rinsing off  the CHG Soap.                9.  Pat yourself dry with a clean towel.            10.  Wear clean pajamas.            11.  Place clean sheets on your bed the night of your first shower and do not  sleep with pets. Day of Surgery : Do not apply any lotions/deodorants the morning of surgery.  Please wear clean clothes to the hospital/surgery center.  FAILURE TO FOLLOW THESE INSTRUCTIONS MAY RESULT IN THE CANCELLATION OF YOUR SURGERY PATIENT SIGNATURE_________________________________  NURSE SIGNATURE__________________________________  ________________________________________________________________________

## 2020-12-02 ENCOUNTER — Encounter (HOSPITAL_COMMUNITY)
Admission: RE | Admit: 2020-12-02 | Discharge: 2020-12-02 | Disposition: A | Discharge status: Home / Self Care | Payer: BC Managed Care – PPO | Source: Ambulatory Visit | Attending: Surgery | Admitting: Surgery

## 2020-12-02 ENCOUNTER — Other Ambulatory Visit: Payer: Self-pay

## 2020-12-02 ENCOUNTER — Encounter (HOSPITAL_COMMUNITY): Payer: Self-pay

## 2020-12-02 VITALS — BP 191/131 | HR 95 | Temp 99.0°F | Resp 18 | Ht 64.0 in | Wt 250.0 lb

## 2020-12-02 DIAGNOSIS — Z01812 Encounter for preprocedural laboratory examination: Secondary | ICD-10-CM | POA: Insufficient documentation

## 2020-12-02 DIAGNOSIS — I10 Essential (primary) hypertension: Secondary | ICD-10-CM

## 2020-12-02 DIAGNOSIS — G4730 Sleep apnea, unspecified: Secondary | ICD-10-CM | POA: Insufficient documentation

## 2020-12-02 DIAGNOSIS — I4891 Unspecified atrial fibrillation: Secondary | ICD-10-CM | POA: Diagnosis not present

## 2020-12-02 DIAGNOSIS — K219 Gastro-esophageal reflux disease without esophagitis: Secondary | ICD-10-CM | POA: Diagnosis not present

## 2020-12-02 DIAGNOSIS — R7303 Prediabetes: Secondary | ICD-10-CM

## 2020-12-02 HISTORY — DX: Gastro-esophageal reflux disease without esophagitis: K21.9

## 2020-12-02 HISTORY — DX: Prediabetes: R73.03

## 2020-12-02 LAB — BASIC METABOLIC PANEL
Anion gap: 7 (ref 5–15)
BUN: 20 mg/dL (ref 6–20)
CO2: 25 mmol/L (ref 22–32)
Calcium: 8.8 mg/dL — ABNORMAL LOW (ref 8.9–10.3)
Chloride: 105 mmol/L (ref 98–111)
Creatinine, Ser: 1.15 mg/dL — ABNORMAL HIGH (ref 0.44–1.00)
GFR, Estimated: 57 mL/min — ABNORMAL LOW (ref >60)
Glucose, Bld: 106 mg/dL — ABNORMAL HIGH (ref 70–99)
Potassium: 4 mmol/L (ref 3.5–5.1)
Sodium: 137 mmol/L (ref 135–145)

## 2020-12-02 LAB — CBC
HCT: 39 % (ref 36.0–46.0)
Hemoglobin: 11.8 g/dL — ABNORMAL LOW (ref 12.0–15.0)
MCH: 26 pg (ref 26.0–34.0)
MCHC: 30.3 g/dL (ref 30.0–36.0)
MCV: 86.1 fL (ref 80.0–100.0)
Platelets: 296 10*3/uL (ref 150–400)
RBC: 4.53 MIL/uL (ref 3.87–5.11)
RDW: 17.5 % — ABNORMAL HIGH (ref 11.5–15.5)
WBC: 8.3 10*3/uL (ref 4.0–10.5)
nRBC: 0 % (ref 0.0–0.2)

## 2020-12-02 LAB — HEMOGLOBIN A1C
Hgb A1c MFr Bld: 5.8 % — ABNORMAL HIGH (ref 4.8–5.6)
Mean Plasma Glucose: 119.76 mg/dL

## 2020-12-02 LAB — GLUCOSE, CAPILLARY: Glucose-Capillary: 103 mg/dL — ABNORMAL HIGH (ref 70–99)

## 2020-12-02 NOTE — Progress Notes (Signed)
COVID test-12/15/20   PCP - Dr. Harrison Mons [facility] primary care Cardiologist - Dr.D.  Callwood  Chest x-ray - no EKG - 08/04/20 Stress Test - no ECHO - no Cardiac Cath - NA Pacemaker/ICD device last checked:NA  Sleep Study - yes Pt wasn't able to finish the test CPAP - no Stop bang score was 6. Pt will call her PCP today  Fasting Blood Sugar - NA Checks Blood Sugar _____ times a day  Blood Thinner Instructions:NA Aspirin Instructions: Last Dose:  Anesthesia review: yes  Patient denies shortness of breath, fever, cough and chest pain at PAT appointment Pt's BMI is 42.9 she gets SOB on one flight of stairs and sometimes while doing housework the is not new for her. She had an elevated BP at PAT visit. 191/131 taken after appointment it was 186/96. She will call her PCP today and monitor it at home.  Patient verbalized understanding of instructions that were given to them at the PAT appointment. Patient was also instructed that they will need to review over the PAT instructions again at home before surgery. yes

## 2020-12-07 ENCOUNTER — Inpatient Hospital Stay: Payer: BC Managed Care – PPO | Admitting: Nurse Practitioner

## 2020-12-07 ENCOUNTER — Telehealth: Payer: Self-pay | Admitting: Nurse Practitioner

## 2020-12-07 ENCOUNTER — Inpatient Hospital Stay: Payer: BC Managed Care – PPO

## 2020-12-07 NOTE — Telephone Encounter (Signed)
Called patient to reschedule missed appointment today. Left VM.

## 2020-12-08 NOTE — Progress Notes (Addendum)
Anesthesia Chart Review   Case: [id number] Date/Time: 12/17/20 1015   Procedure: XI ROBOTIC ASSISTED HIATAL HERNIA REPAIR WITH FUNDOPLICATION   Anesthesia type: General   Pre-op diagnosis: GERD WITH CAMERON ULCERS   Location: [facility] ROOM 05 / [facility]   Surgeons: Luretha Murphy, MD       DISCUSSION:50 y.o. never smoker with h/o HTN, sleep apnea, a-fib, GERD with cameron ulcers scheduled for above procedure 12/17/20 with Dr. Luretha Murphy.   Elevated BP at PAT visit, on recheck 186/96. Pt reports she has not taken her HTN medications today.  Asymptomatic.  Advised to contact PCP today, pt agrees.   Pt last seen by cardiology 08/04/2020. At that time she was cleared for colonoscopy with intermediate risk and advised to follow up in 1 month for BP recheck.   Discussed with triage nurse at Dr. Ermalene Searing office.  Pt will need cardiac clearance and optimization of BP prior to procedure.   Addendum 12/15/2020:  Seen by cardiology 12/14/2020. Hydrochlorothiazide 25 mg started at this visit due to poorly controlled HTN. Per OV note, "the patient is cleared from a cardiology perspective as an intermediate risk for surgical hernia repair and she has been medically optimized"  Anticipate pt can proceed with planned procedure barring acute status change.   VS: BP (!) 191/131 Comment: Notified PA and Nurse  Pulse 95   Temp 37.2 C (Oral)   Resp 18   Ht 5\' 4"  (1.626 m)   Wt 113.4 kg   SpO2 99%   BMI 42.91 kg/m   PROVIDERS: [place], [facility]  , MD is Cardiologist  LABS: Labs reviewed: Acceptable for surgery. (all labs ordered are listed, but only abnormal results are displayed)  Labs Reviewed  BASIC METABOLIC PANEL - Abnormal; Notable for the following components:      Result Value   Glucose, Bld 106 (*)    Creatinine, Ser 1.15 (*)    Calcium 8.8 (*)    GFR, Estimated 57 (*)    All other components within normal limits  CBC - Abnormal; Notable for the following  components:   Hemoglobin 11.8 (*)    RDW 17.5 (*)    All other components within normal limits  HEMOGLOBIN A1C - Abnormal; Notable for the following components:   Hgb A1c MFr Bld 5.8 (*)    All other components within normal limits  GLUCOSE, CAPILLARY - Abnormal; Notable for the following components:   Glucose-Capillary 103 (*)    All other components within normal limits     IMAGES:   EKG: 01/14/2020 Rate 86 bpm  Normal sinus rhythm Possible Left atrial enlargement Anterior infarct , age undetermined Abnormal ECG When compared with ECG of 14-Jan-2020 09:30, (unconfirmed) No significant change was found   CV: Echo 01/14/2020  1. Left ventricular ejection fraction, by estimation, is 60 to 65%. The  left ventricle has normal function. The left ventricle has no regional  wall motion abnormalities. There is mild left ventricular hypertrophy.  Left ventricular diastolic parameters  are consistent with Grade I diastolic dysfunction (impaired relaxation).   2. Right ventricular systolic function is normal. The right ventricular  size is normal. There is normal pulmonary artery systolic pressure.   3. The mitral valve is normal in structure. Trivial mitral valve  regurgitation.   4. The aortic valve was not well visualized. Aortic valve regurgitation  is not visualized. Past Medical History:  Diagnosis Date   Anginal pain (HCC) 01/14/2020   Arthritis  Blood transfusion without reported diagnosis    Chickenpox    Dysrhythmia 01/14/2020   A-Fib   Frequent headaches    GERD (gastroesophageal reflux disease)    Headache    migraines   History of blood transfusion    Hyperlipidemia    Hypertension    Iron deficiency anemia due to chronic blood loss 07/14/2018   PAF (paroxysmal atrial fibrillation) (HCC)    Pre-diabetes    Sleep apnea    CPAP ordered but can't tolerate it    Past Surgical History:  Procedure Laterality Date   CESAREAN SECTION  1997   COLONOSCOPY  WITH PROPOFOL N/A 05/19/2016   Procedure: COLONOSCOPY WITH PROPOFOL;  Surgeon: Midge Minium, MD;  Location: [facility];  Service: Endoscopy;  Laterality: N/A;   COLONOSCOPY WITH PROPOFOL N/A 08/10/2020   Procedure: COLONOSCOPY WITH PROPOFOL;  Surgeon: Pasty Spillers, MD;  Location: [facility];  Service: Endoscopy;  Laterality: N/A;   ESOPHAGOGASTRODUODENOSCOPY (EGD) WITH PROPOFOL N/A 05/19/2016   Procedure: ESOPHAGOGASTRODUODENOSCOPY (EGD) WITH PROPOFOL;  Surgeon: Midge Minium, MD;  Location: [facility];  Service: Endoscopy;  Laterality: N/A;   ESOPHAGOGASTRODUODENOSCOPY (EGD) WITH PROPOFOL N/A 08/10/2020   Procedure: ESOPHAGOGASTRODUODENOSCOPY (EGD) WITH PROPOFOL;  Surgeon: Pasty Spillers, MD;  Location: [facility];  Service: Endoscopy;  Laterality: N/A;  sleep apnea   HYSTEROSCOPY WITH D & C N/A 08/13/2018   Procedure: DILATATION AND CURETTAGE /HYSTEROSCOPY WITH ENDOMETRIAL ABLATION;  Surgeon: Natale Milch, MD;  Location: [facility];  Service: Gynecology;  Laterality: N/A;   KNEE SURGERY Bilateral 2010   2008   KNEE SURGERY     right and left ACL   POLYPECTOMY  08/10/2020   Procedure: POLYPECTOMY;  Surgeon: Pasty Spillers, MD;  Location: [facility];  Service: Endoscopy;;   TUBAL LIGATION  1997    MEDICATIONS:  acetaminophen (TYLENOL) 500 MG tablet   albuterol (VENTOLIN HFA) 108 (90 Base) MCG/ACT inhaler   atorvastatin (LIPITOR) 40 MG tablet   diltiazem (CARDIZEM) 120 MG tablet   ferrous sulfate 325 (65 FE) MG tablet   fluticasone (FLONASE) 50 MCG/ACT nasal spray   gabapentin (NEURONTIN) 300 MG capsule   methocarbamol (ROBAXIN) 500 MG tablet   metoprolol succinate (TOPROL-XL) 100 MG 24 hr tablet   Multiple Vitamin (MULTIVITAMIN WITH MINERALS) TABS tablet   traMADol (ULTRAM) 50 MG tablet   vitamin B-12 (CYANOCOBALAMIN) 1000 MCG tablet   Vitamin D, Ergocalciferol, (DRISDOL) 1.25 MG (50000 UNIT) CAPS capsule   No current  facility-administered medications for this encounter.    Jodell Cipro Ward, PA-C [facility] [phone]

## 2020-12-15 NOTE — Anesthesia Preprocedure Evaluation (Addendum)
Anesthesia Evaluation  Patient identified by MRN, date of birth, ID band Patient awake    Reviewed: Allergy & Precautions, NPO status , Patient's Chart, lab work & pertinent test results  Airway Mallampati: II       Dental  (+) Chipped, Poor Dentition   Pulmonary asthma , sleep apnea ,    Pulmonary exam normal        Cardiovascular hypertension, Pt. on medications and Pt. on home beta blockers Normal cardiovascular exam     Neuro/Psych negative psych ROS   GI/Hepatic GERD  ,  Endo/Other    Renal/GU   negative genitourinary   Musculoskeletal   Abdominal (+) + obese,   Peds  Hematology   Anesthesia Other Findings   Reproductive/Obstetrics negative OB ROS                            Anesthesia Physical Anesthesia Plan  ASA: 3  Anesthesia Plan: General   Post-op Pain Management:    Induction: Intravenous  PONV Risk Score and Plan: 4 or greater and Ondansetron, Dexamethasone and Midazolam  Airway Management Planned: Oral ETT and Video Laryngoscope Planned  Additional Equipment: None  Intra-op Plan:   Post-operative Plan: Extubation in OR  Informed Consent: I have reviewed the patients History and Physical, chart, labs and discussed the procedure including the risks, benefits and alternatives for the proposed anesthesia with the patient or authorized representative who has indicated his/her understanding and acceptance.     Dental advisory given  Plan Discussed with: CRNA  Anesthesia Plan Comments: (See PAT note 12/02/2020, Jodell Cipro Ward, PA-C  Glidescope in the room.)     Anesthesia Quick Evaluation                                  Anesthesia Evaluation  Patient identified by MRN, date of birth, ID band Patient awake    Reviewed: Allergy & Precautions, NPO status   Airway Mallampati: II  TM Distance: >3 FB     Dental   Pulmonary sleep apnea (no  cpap) ,    Pulmonary exam normal        Cardiovascular hypertension,  Rhythm:Regular Rate:Normal  Hx of ER visit in Aug '21, found to be in A Fib with RVR.  Echo WNL with EF 55%.  Now on metoprolol and diltiazem   Neuro/Psych  Headaches, PSYCHIATRIC DISORDERS Depression    GI/Hepatic   Endo/Other  Morbid obesity (BMI > 40)  Renal/GU      Musculoskeletal  (+) Arthritis ,   Abdominal   Peds  Hematology  (+) anemia ,   Anesthesia Other Findings   Reproductive/Obstetrics                            Anesthesia Physical Anesthesia Plan  ASA: 3  Anesthesia Plan: General   Post-op Pain Management:    Induction: Intravenous  PONV Risk Score and Plan: Propofol infusion, TIVA and Treatment may vary due to age or medical condition  Airway Management Planned: Natural Airway and Nasal Cannula  Additional Equipment:   Intra-op Plan:   Post-operative Plan:   Informed Consent: I have reviewed the patients History and Physical, chart, labs and discussed the procedure including the risks, benefits and alternatives for the proposed anesthesia with the patient or authorized representative who has indicated his/her understanding and  acceptance.       Plan Discussed with: CRNA  Anesthesia Plan Comments:         Anesthesia Quick Evaluation                                   Anesthesia Evaluation  Patient identified by MRN, date of birth, ID band Patient awake    Reviewed: Allergy & Precautions, NPO status   Airway Mallampati: II  TM Distance: >3 FB     Dental   Pulmonary sleep apnea (no cpap) ,    Pulmonary exam normal        Cardiovascular hypertension,  Rhythm:Regular Rate:Normal  Hx of ER visit in Aug '21, found to be in A Fib with RVR.  Echo WNL with EF 55%.  Now on metoprolol and diltiazem   Neuro/Psych  Headaches, PSYCHIATRIC DISORDERS Depression    GI/Hepatic   Endo/Other  Morbid obesity (BMI > 40)   Renal/GU      Musculoskeletal  (+) Arthritis ,   Abdominal   Peds  Hematology  (+) anemia ,   Anesthesia Other Findings   Reproductive/Obstetrics                            Anesthesia Physical Anesthesia Plan  ASA: 3  Anesthesia Plan: General   Post-op Pain Management:    Induction: Intravenous  PONV Risk Score and Plan: Propofol infusion, TIVA and Treatment may vary due to age or medical condition  Airway Management Planned: Natural Airway and Nasal Cannula  Additional Equipment:   Intra-op Plan:   Post-operative Plan:   Informed Consent: I have reviewed the patients History and Physical, chart, labs and discussed the procedure including the risks, benefits and alternatives for the proposed anesthesia with the patient or authorized representative who has indicated his/her understanding and acceptance.       Plan Discussed with: CRNA  Anesthesia Plan Comments:         Anesthesia Quick Evaluation                                   Anesthesia Evaluation  Patient identified by MRN, date of birth, ID band Patient awake    Reviewed: Allergy & Precautions, H&P , NPO status , Patient's Chart, lab work & pertinent test results  History of Anesthesia Complications Negative for: history of anesthetic complications  Airway Mallampati: III  TM Distance: >3 FB Neck ROM: limited    Dental  (+) Chipped   Pulmonary neg shortness of breath, sleep apnea ,           Cardiovascular Exercise Tolerance: Good hypertension, (-) angina(-) Past MI and (-) DOE      Neuro/Psych  Headaches, negative psych ROS   GI/Hepatic negative GI ROS, Neg liver ROS, neg GERD  ,  Endo/Other  negative endocrine ROS  Renal/GU      Musculoskeletal   Abdominal   Peds  Hematology negative hematology ROS (+)   Anesthesia Other Findings Past Medical History: No date: Blood transfusion without reported diagnosis No date: Headache No  date: Hypertension 07/14/2018: Iron deficiency anemia due to chronic blood loss No date: Sleep apnea     Comment:  CPAP ordered but can't tolerate it  Past Surgical History: No date: CESAREAN SECTION 05/19/2016: COLONOSCOPY WITH PROPOFOL; N/A  Comment:  Procedure: COLONOSCOPY WITH PROPOFOL;  Surgeon: Midge Minium, MD;  Location: [facility] END[facility];  Service:               Endoscopy;  Laterality: N/A; 05/19/2016: ESOPHAGOGASTRODUODENOSCOPY (EGD) WITH PROPOFOL; N/A     Comment:  Procedure: ESOPHAGOGASTRODUODENOSCOPY (EGD) WITH               PROPOFOL;  Surgeon: Midge Minium, MD;  Location: [facility]               ENDOSCOPY;  Service: Endoscopy;  Laterality: N/A; No date: KNEE SURGERY; Bilateral 1997: TUBAL LIGATION  BMI    Body Mass Index: 38.22 kg/m      Reproductive/Obstetrics negative OB ROS                             Anesthesia Physical Anesthesia Plan  ASA: III  Anesthesia Plan: General LMA   Post-op Pain Management:    Induction: Intravenous  PONV Risk Score and Plan: Dexamethasone, Ondansetron, Midazolam and Treatment may vary due to age or medical condition  Airway Management Planned: LMA  Additional Equipment:   Intra-op Plan:   Post-operative Plan: Extubation in OR  Informed Consent: I have reviewed the patients History and Physical, chart, labs and discussed the procedure including the risks, benefits and alternatives for the proposed anesthesia with the patient or authorized representative who has indicated his/her understanding and acceptance.     Dental Advisory Given  Plan Discussed with: Anesthesiologist, CRNA and Surgeon  Anesthesia Plan Comments: (Patient consented for risks of anesthesia including but not limited to:  - adverse reactions to medications - damage to teeth, lips or other oral mucosa - sore throat or hoarseness - Damage to heart, brain, lungs or loss of life  Patient voiced understanding.)         Anesthesia Quick Evaluation                                   Anesthesia Evaluation  Patient identified by MRN, date of birth, ID band Patient awake    Reviewed: Allergy & Precautions, H&P , NPO status , Patient's Chart, lab work & pertinent test results  History of Anesthesia Complications Negative for: history of anesthetic complications  Airway Mallampati: III  TM Distance: >3 FB Neck ROM: limited    Dental  (+) Chipped   Pulmonary neg shortness of breath, sleep apnea ,           Cardiovascular Exercise Tolerance: Good hypertension, (-) angina(-) Past MI and (-) DOE      Neuro/Psych  Headaches, negative psych ROS   GI/Hepatic negative GI ROS, Neg liver ROS, neg GERD  ,  Endo/Other  negative endocrine ROS  Renal/GU      Musculoskeletal   Abdominal   Peds  Hematology negative hematology ROS (+)   Anesthesia Other Findings Past Medical History: No date: Blood transfusion without reported diagnosis No date: Headache No date: Hypertension 07/14/2018: Iron deficiency anemia due to chronic blood loss No date: Sleep apnea     Comment:  CPAP ordered but can't tolerate it  Past Surgical History: No date: CESAREAN SECTION 05/19/2016: COLONOSCOPY WITH PROPOFOL; N/A     Comment:  Procedure: COLONOSCOPY WITH PROPOFOL;  Surgeon: Servando Snare,  Darren, MD;  Location: [facility] ENDOSCOPY;  Service:               Endoscopy;  Laterality: N/A; 05/19/2016: ESOPHAGOGASTRODUODENOSCOPY (EGD) WITH PROPOFOL; N/A     Comment:  Procedure: ESOPHAGOGASTRODUODENOSCOPY (EGD) WITH               PROPOFOL;  Surgeon: Midge Minium, MD;  Location: [facility]               ENDOSCOPY;  Service: Endoscopy;  Laterality: N/A; No date: KNEE SURGERY; Bilateral 1997: TUBAL LIGATION  BMI    Body Mass Index: 38.22 kg/m      Reproductive/Obstetrics negative OB ROS                             Anesthesia Physical Anesthesia Plan  ASA:  III  Anesthesia Plan: General LMA   Post-op Pain Management:    Induction: Intravenous  PONV Risk Score and Plan: Dexamethasone, Ondansetron, Midazolam and Treatment may vary due to age or medical condition  Airway Management Planned: LMA  Additional Equipment:   Intra-op Plan:   Post-operative Plan: Extubation in OR  Informed Consent: I have reviewed the patients History and Physical, chart, labs and discussed the procedure including the risks, benefits and alternatives for the proposed anesthesia with the patient or authorized representative who has indicated his/her understanding and acceptance.     Dental Advisory Given  Plan Discussed with: Anesthesiologist, CRNA and Surgeon  Anesthesia Plan Comments: (Patient consented for risks of anesthesia including but not limited to:  - adverse reactions to medications - damage to teeth, lips or other oral mucosa - sore throat or hoarseness - Damage to heart, brain, lungs or loss of life  Patient voiced understanding.)        Anesthesia Quick Evaluation

## 2020-12-16 NOTE — H&P (Signed)
REFERRING PHYSICIAN: Melodie Bouillon, MD  PROVIDER: Katha Cabal, MD  MRN: [id number] DOB: 1970/11/27 DATE OF ENCOUNTER: 10/13/2020  Subjective   Chief Complaint: New Consultation (Hiatal hernia)   History of Present Illness: Madison Lewis is a 50 y.o. female who is seen today as an office consultation at the request of Dr. Maximino Greenland for evaluation of New Consultation (Hiatal hernia) .   "Madison Lewis has had numerous blood transfusions and iron infusions for anemia. 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 The hernia is big enough to be seen on my chest x-ray.  I drew out operations to repair the hiatus along with fundoplication for her and then I gave her a booklet by Gaylyn Rong communication regarding reflux and repair. She does have nocturnal reflux with coughing and choking.  She works for [employer] and may have been. At the completion of our discussion she seemed to understand the procedure and I gave her the booklet to read at her leisure. She denies any underlying malignancies or heart problems and has had no history of DVT. I discussed repairing this robotically with the use of multiple trocar sites. We will go ahead and get scheduled.  Review of Systems: See HPI as well for other ROS.  ROS   Medical History: Past Medical History:  Diagnosis Date   GERD (gastroesophageal reflux disease)   Hypertension   Patient Active Problem List  Diagnosis   Essential hypertension   Chronic tension-type headache, not intractable   Disorder of lipid metabolism   Prediabetes   Hematochezia   Iron deficiency anemia   Symptomatic anemia, unspecified   Vitamin D deficiency   Atrial fibrillation (CMS-HCC)   Past Surgical History:  Procedure Laterality Date   CESAREAN SECTION   KNEE ARTHROSCOPY Bilateral  ACL repair b/l - different times   MYOMECTOMY ABDOMINAL 2008  uterine fibroid; "size of grapefruit"    REMOVAL OVARIAN CYST Right 2000    No Known Allergies  Current Outpatient Medications on File Prior to Visit  Medication Sig Dispense Refill   acetaminophen (TYLENOL) 500 MG tablet Take by mouth   albuterol 90 mcg/actuation inhaler Inhale into the lungs   atorvastatin (LIPITOR) 40 MG tablet Take 1 tablet (40 mg total) by mouth once daily 90 tablet 3   benzonatate (TESSALON) 200 MG capsule Take by mouth   cyanocobalamin (VITAMIN B12) 1000 MCG tablet Take by mouth   docusate (COLACE) 100 MG capsule Take by mouth.   ferrous sulfate (SLOW FE) 142 mg (45 mg iron) TbER Take 45 mg by mouth once daily 90 tablet 3   ferrous sulfate 325 (65 FE) MG tablet Take by mouth once daily. Away from food with orange juice   fluticasone propionate (FLONASE) 50 mcg/actuation nasal spray Place 1 spray into one nostril 2 (two) times daily   multivitamin with iron-minerals (SUPER THERA VITE M) tablet Take by mouth.   diltiazem (CARDIZEM) 120 MG tablet Take 1 tablet (120 mg total) by mouth 2 (two) times daily for 90 days 180 tablet 0   ergocalciferol, vitamin D2, 1,250 mcg (50,000 unit) capsule Take 1 capsule (50,000 Units total) by mouth once a week for 91 days 12 capsule 1   FLUoxetine (PROZAC) 10 MG capsule Take 1 capsule (10 mg total) by mouth once daily 90 capsule 0   gabapentin (NEURONTIN) 300 MG capsule Take 1 capsule (300 mg total) by mouth nightly for 30 days 30 capsule 0   metoprolol  succinate (TOPROL-XL) 100 MG XL tablet Take 1 tablet (100 mg total) by mouth once daily for 90 days 90 tablet 3   No current facility-administered medications on file prior to visit.   Family History  Problem Relation Age of Onset   Anxiety Mother   Colon polyps Mother   Hyperlipidemia (Elevated cholesterol) Mother   High blood pressure (Hypertension) Mother   Stroke Mother   Alcohol abuse Mother   Alcohol abuse Father   Colon cancer Father   High blood pressure (Hypertension) Father   High blood pressure  (Hypertension) Sister   Obesity Sister   High blood pressure (Hypertension) Brother   Alcohol abuse Brother   Diabetes type II Brother   No Known Problems Daughter   No Known Problems Son   Alzheimer's disease Maternal Grandmother   Alcohol abuse Maternal Grandmother   Glaucoma Maternal Grandmother   Hyperlipidemia (Elevated cholesterol) Maternal Grandmother   High blood pressure (Hypertension) Maternal Grandmother   Stroke Maternal Grandmother   Alzheimer's disease Maternal Grandfather   Glaucoma Maternal Grandfather   Prostate cancer Maternal Grandfather   Alzheimer's disease Paternal Grandmother   Coronary Artery Disease (Blocked arteries around heart) Paternal Grandmother   High blood pressure (Hypertension) Paternal Grandmother   Hyperlipidemia (Elevated cholesterol) Paternal Grandmother   Osteoarthritis Paternal Grandmother   Prostate cancer Paternal Grandfather    Social History   Tobacco Use  Smoking Status Never Smoker  Smokeless Tobacco Never Used    Social History   Socioeconomic History   Marital status: Single  Tobacco Use   Smoking status: Never Smoker   Smokeless tobacco: Never Used  Scientific laboratory technician Use: Never used  Substance and Sexual Activity   Alcohol use: Yes  Comment: occ   Drug use: No   Sexual activity: Not Currently  Social History Narrative  Divorced, Cares for granddaughter with learning disorder  3 grown children  Drives Conservator, museum/gallery at [employer]  Denies tobacco/drug use  2 etoh per week  Caffeine: none  Exercise: walks dogs daily   Objective:   Vitals:  10/13/20 1412  BP: (!) 160/102  Pulse: 95  Temp: 36.6 C (97.9 F)  Weight: (!) 110.8 kg (244 lb 3.2 oz)  Height: 162.6 cm (5\' 4" )   Body mass index is 41.92 kg/m.  Physical Exam General: Short in stature African-American female no acute distress HEENT : No evidence of hoarseness or stridor Chest: Clear Heart: Sinus rhythm Breast: Not examined Abdomen: No  prior upper abdominal surgery nontender GU not examined Rectal not examined Extremities full range of motion Neuro alert and oriented x3. Motor and sensory function grossly intact.  Labs, Imaging and Diagnostic Testing: We will get an upper GI series to assess the size of this hiatal hernia which helps in the planning and execution of hiatal hernia repair and fundoplication  Assessment and Plan:  Diagnoses and all orders for this visit:  GERD with stricture  Other iron deficiency anemia  Essential hypertension  Hiatal hernia with gastroesophageal reflux  Large hiatal hernia we will map with upper GI series and plan laparoscopic/robotic repair of her hiatal hernia with fundoplication at [facility]. We will plan for overnight stay for observation.  I shave discussed this with her and answered all of her questions.   Schedule robotic hiatal hernia repair at [facility]  No follow-ups on file.  Kendel Pesnell Donia Pounds, MD

## 2020-12-17 ENCOUNTER — Encounter (HOSPITAL_COMMUNITY): Payer: Self-pay | Admitting: Surgery

## 2020-12-17 ENCOUNTER — Observation Stay (HOSPITAL_COMMUNITY)
Admission: RE | Admit: 2020-12-17 | Discharge: 2020-12-18 | Disposition: A | Discharge status: Home / Self Care | Payer: BC Managed Care – PPO | Attending: Surgery | Admitting: Surgery

## 2020-12-17 ENCOUNTER — Encounter (HOSPITAL_COMMUNITY)
Admission: RE | Disposition: A | Discharge status: Home / Self Care | Payer: Self-pay | Source: Home / Self Care | Attending: Surgery

## 2020-12-17 ENCOUNTER — Ambulatory Visit (HOSPITAL_COMMUNITY): Payer: BC Managed Care – PPO | Admitting: Physician Assistant

## 2020-12-17 ENCOUNTER — Ambulatory Visit (HOSPITAL_COMMUNITY): Payer: BC Managed Care – PPO | Admitting: Certified Registered Nurse Anesthetist

## 2020-12-17 ENCOUNTER — Other Ambulatory Visit: Payer: Self-pay

## 2020-12-17 DIAGNOSIS — Z6841 Body Mass Index (BMI) 40.0 and over, adult: Secondary | ICD-10-CM | POA: Insufficient documentation

## 2020-12-17 DIAGNOSIS — Z79899 Other long term (current) drug therapy: Secondary | ICD-10-CM | POA: Insufficient documentation

## 2020-12-17 DIAGNOSIS — E785 Hyperlipidemia, unspecified: Secondary | ICD-10-CM | POA: Insufficient documentation

## 2020-12-17 DIAGNOSIS — Z20822 Contact with and (suspected) exposure to covid-19: Secondary | ICD-10-CM | POA: Insufficient documentation

## 2020-12-17 DIAGNOSIS — K219 Gastro-esophageal reflux disease without esophagitis: Secondary | ICD-10-CM | POA: Diagnosis not present

## 2020-12-17 DIAGNOSIS — K449 Diaphragmatic hernia without obstruction or gangrene: Secondary | ICD-10-CM | POA: Diagnosis not present

## 2020-12-17 DIAGNOSIS — Z8679 Personal history of other diseases of the circulatory system: Secondary | ICD-10-CM | POA: Diagnosis not present

## 2020-12-17 DIAGNOSIS — Z9889 Other specified postprocedural states: Secondary | ICD-10-CM

## 2020-12-17 DIAGNOSIS — I10 Essential (primary) hypertension: Secondary | ICD-10-CM | POA: Diagnosis not present

## 2020-12-17 DIAGNOSIS — R7303 Prediabetes: Secondary | ICD-10-CM | POA: Insufficient documentation

## 2020-12-17 DIAGNOSIS — Z01818 Encounter for other preprocedural examination: Secondary | ICD-10-CM

## 2020-12-17 DIAGNOSIS — E669 Obesity, unspecified: Secondary | ICD-10-CM | POA: Diagnosis not present

## 2020-12-17 HISTORY — PX: XI ROBOTIC ASSISTED HIATAL HERNIA REPAIR: SHX6889

## 2020-12-17 LAB — TYPE AND SCREEN
ABO/RH(D): B POS
Antibody Screen: NEGATIVE

## 2020-12-17 LAB — CBC
HCT: 37.4 % (ref 36.0–46.0)
Hemoglobin: 11.2 g/dL — ABNORMAL LOW (ref 12.0–15.0)
MCH: 26.8 pg (ref 26.0–34.0)
MCHC: 29.9 g/dL — ABNORMAL LOW (ref 30.0–36.0)
MCV: 89.5 fL (ref 80.0–100.0)
Platelets: 228 10*3/uL (ref 150–400)
RBC: 4.18 MIL/uL (ref 3.87–5.11)
RDW: 16.6 % — ABNORMAL HIGH (ref 11.5–15.5)
WBC: 13 10*3/uL — ABNORMAL HIGH (ref 4.0–10.5)
nRBC: 0 % (ref 0.0–0.2)

## 2020-12-17 LAB — CREATININE, SERUM
Creatinine, Ser: 2.17 mg/dL — ABNORMAL HIGH (ref 0.44–1.00)
GFR, Estimated: 27 mL/min — ABNORMAL LOW (ref >60)

## 2020-12-17 LAB — SARS CORONAVIRUS 2 BY RT PCR (HOSPITAL ORDER, PERFORMED IN ~~LOC~~ HOSPITAL LAB): SARS Coronavirus 2: NEGATIVE

## 2020-12-17 LAB — GLUCOSE, CAPILLARY: Glucose-Capillary: 170 mg/dL — ABNORMAL HIGH (ref 70–99)

## 2020-12-17 LAB — PREGNANCY, URINE: Preg Test, Ur: NEGATIVE

## 2020-12-17 SURGERY — REPAIR, HERNIA, HIATAL, ROBOT-ASSISTED
Anesthesia: General | Site: Abdomen

## 2020-12-17 MED ORDER — ROCURONIUM BROMIDE 10 MG/ML (PF) SYRINGE
PREFILLED_SYRINGE | INTRAVENOUS | Status: AC
  Filled 2020-12-17: qty 10

## 2020-12-17 MED ORDER — ONDANSETRON HCL 4 MG/2ML IJ SOLN
INTRAMUSCULAR | Status: DC | PRN
  Administered 2020-12-17: 4 mg via INTRAVENOUS

## 2020-12-17 MED ORDER — KETAMINE HCL 10 MG/ML IJ SOLN
INTRAMUSCULAR | Status: AC
  Filled 2020-12-17: qty 1

## 2020-12-17 MED ORDER — DILTIAZEM HCL ER 60 MG PO CP12
120.0000 mg | ORAL_CAPSULE | Freq: Two times a day (BID) | ORAL | Status: DC
  Administered 2020-12-17 – 2020-12-18 (×2): 120 mg via ORAL
  Filled 2020-12-17 (×3): qty 2

## 2020-12-17 MED ORDER — ONDANSETRON 4 MG PO TBDP: 4.0000 mg | ORAL_TABLET | Freq: Four times a day (QID) | ORAL | Status: DC | PRN

## 2020-12-17 MED ORDER — HYDROMORPHONE HCL 1 MG/ML IJ SOLN
INTRAMUSCULAR | Status: AC
  Filled 2020-12-17: qty 1

## 2020-12-17 MED ORDER — PROPOFOL 10 MG/ML IV BOLUS
INTRAVENOUS | Status: AC
  Filled 2020-12-17: qty 20

## 2020-12-17 MED ORDER — LIDOCAINE 2% (20 MG/ML) 5 ML SYRINGE
INTRAMUSCULAR | Status: DC | PRN
  Administered 2020-12-17: 60 mg via INTRAVENOUS

## 2020-12-17 MED ORDER — SCOPOLAMINE 1 MG/3DAYS TD PT72
1.0000 | MEDICATED_PATCH | TRANSDERMAL | Status: DC
  Administered 2020-12-17: 1.5 mg via TRANSDERMAL
  Filled 2020-12-17: qty 1

## 2020-12-17 MED ORDER — PROMETHAZINE HCL 25 MG/ML IJ SOLN: 6.2500 mg | INTRAMUSCULAR | Status: DC | PRN

## 2020-12-17 MED ORDER — KCL IN DEXTROSE-NACL 20-5-0.45 MEQ/L-%-% IV SOLN
INTRAVENOUS | Status: DC
  Filled 2020-12-17 (×2): qty 1000

## 2020-12-17 MED ORDER — CHLORHEXIDINE GLUCONATE 0.12 % MT SOLN
15.0000 mL | Freq: Once | OROMUCOSAL | Status: AC
  Administered 2020-12-17: 15 mL via OROMUCOSAL

## 2020-12-17 MED ORDER — METOPROLOL TARTRATE 5 MG/5ML IV SOLN: 5.0000 mg | Freq: Four times a day (QID) | INTRAVENOUS | Status: DC | PRN

## 2020-12-17 MED ORDER — METOPROLOL SUCCINATE ER 50 MG PO TB24
100.0000 mg | ORAL_TABLET | Freq: Every day | ORAL | Status: DC
  Administered 2020-12-17 – 2020-12-18 (×2): 100 mg via ORAL
  Filled 2020-12-17 (×2): qty 2

## 2020-12-17 MED ORDER — LACTATED RINGERS IR SOLN
Status: DC | PRN
  Administered 2020-12-17: 1000 mL

## 2020-12-17 MED ORDER — HYDROCODONE-ACETAMINOPHEN 5-325 MG PO TABS
1.0000 | ORAL_TABLET | ORAL | Status: DC | PRN
  Administered 2020-12-17: 1 via ORAL
  Administered 2020-12-18: 2 via ORAL
  Filled 2020-12-17: qty 1
  Filled 2020-12-17 (×2): qty 2

## 2020-12-17 MED ORDER — SODIUM CHLORIDE (PF) 0.9 % IJ SOLN
INTRAMUSCULAR | Status: AC
  Filled 2020-12-17: qty 10

## 2020-12-17 MED ORDER — DEXAMETHASONE SODIUM PHOSPHATE 10 MG/ML IJ SOLN
INTRAMUSCULAR | Status: DC | PRN
  Administered 2020-12-17: 10 mg via INTRAVENOUS

## 2020-12-17 MED ORDER — ORAL CARE MOUTH RINSE: 15.0000 mL | Freq: Once | OROMUCOSAL | Status: AC

## 2020-12-17 MED ORDER — FENTANYL CITRATE (PF) 100 MCG/2ML IJ SOLN
INTRAMUSCULAR | Status: AC
  Filled 2020-12-17: qty 2

## 2020-12-17 MED ORDER — 0.9 % SODIUM CHLORIDE (POUR BTL) OPTIME
TOPICAL | Status: DC | PRN
  Administered 2020-12-17: 1000 mL

## 2020-12-17 MED ORDER — ACETAMINOPHEN 500 MG PO TABS
1000.0000 mg | ORAL_TABLET | ORAL | Status: AC
  Administered 2020-12-17: 1000 mg via ORAL
  Filled 2020-12-17: qty 2

## 2020-12-17 MED ORDER — FENTANYL CITRATE PF 50 MCG/ML IJ SOSY
12.5000 ug | PREFILLED_SYRINGE | INTRAMUSCULAR | Status: DC | PRN
  Administered 2020-12-17: 12.5 ug via INTRAVENOUS
  Filled 2020-12-17: qty 1

## 2020-12-17 MED ORDER — SODIUM CHLORIDE (PF) 0.9 % IJ SOLN
INTRAMUSCULAR | Status: DC | PRN
  Administered 2020-12-17: 10 mL

## 2020-12-17 MED ORDER — SUGAMMADEX SODIUM 200 MG/2ML IV SOLN
INTRAVENOUS | Status: DC | PRN
  Administered 2020-12-17: 200 mg via INTRAVENOUS

## 2020-12-17 MED ORDER — EPHEDRINE 5 MG/ML INJ
INTRAVENOUS | Status: AC
  Filled 2020-12-17: qty 5

## 2020-12-17 MED ORDER — GLYCOPYRROLATE 0.2 MG/ML IJ SOLN
INTRAMUSCULAR | Status: DC | PRN
  Administered 2020-12-17: .2 mg via INTRAVENOUS

## 2020-12-17 MED ORDER — HYDROMORPHONE HCL 1 MG/ML IJ SOLN
0.2500 mg | INTRAMUSCULAR | Status: DC | PRN
  Administered 2020-12-17: 0.25 mg via INTRAVENOUS

## 2020-12-17 MED ORDER — BUPIVACAINE LIPOSOME 1.3 % IJ SUSP
INTRAMUSCULAR | Status: DC | PRN
  Administered 2020-12-17: 20 mL

## 2020-12-17 MED ORDER — HEPARIN SODIUM (PORCINE) 5000 UNIT/ML IJ SOLN
5000.0000 [IU] | Freq: Three times a day (TID) | INTRAMUSCULAR | Status: DC
  Administered 2020-12-17 – 2020-12-18 (×3): 5000 [IU] via SUBCUTANEOUS
  Filled 2020-12-17 (×3): qty 1

## 2020-12-17 MED ORDER — MIDAZOLAM HCL 5 MG/5ML IJ SOLN
INTRAMUSCULAR | Status: DC | PRN
  Administered 2020-12-17: 2 mg via INTRAVENOUS

## 2020-12-17 MED ORDER — PANTOPRAZOLE SODIUM 40 MG IV SOLR
40.0000 mg | Freq: Every day | INTRAVENOUS | Status: DC
  Administered 2020-12-17: 40 mg via INTRAVENOUS
  Filled 2020-12-17: qty 40

## 2020-12-17 MED ORDER — PROPOFOL 10 MG/ML IV BOLUS
INTRAVENOUS | Status: DC | PRN
  Administered 2020-12-17: 200 mg via INTRAVENOUS

## 2020-12-17 MED ORDER — KETOROLAC TROMETHAMINE 30 MG/ML IJ SOLN
INTRAMUSCULAR | Status: AC
  Filled 2020-12-17: qty 1

## 2020-12-17 MED ORDER — LACTATED RINGERS IV SOLN: INTRAVENOUS | Status: DC

## 2020-12-17 MED ORDER — FENTANYL CITRATE (PF) 100 MCG/2ML IJ SOLN
INTRAMUSCULAR | Status: DC | PRN
  Administered 2020-12-17: 100 ug via INTRAVENOUS

## 2020-12-17 MED ORDER — BUPIVACAINE LIPOSOME 1.3 % IJ SUSP
INTRAMUSCULAR | Status: AC
  Filled 2020-12-17: qty 20

## 2020-12-17 MED ORDER — KETOROLAC TROMETHAMINE 30 MG/ML IJ SOLN
30.0000 mg | Freq: Once | INTRAMUSCULAR | Status: AC | PRN
  Administered 2020-12-17: 30 mg via INTRAVENOUS

## 2020-12-17 MED ORDER — SODIUM CHLORIDE 0.9 % IV SOLN
2.0000 g | Freq: Two times a day (BID) | INTRAVENOUS | Status: AC
  Administered 2020-12-17: 2 g via INTRAVENOUS
  Filled 2020-12-17: qty 2

## 2020-12-17 MED ORDER — PHENYLEPHRINE HCL-NACL 20-0.9 MG/250ML-% IV SOLN
INTRAVENOUS | Status: DC | PRN
  Administered 2020-12-17: 80 ug/min via INTRAVENOUS

## 2020-12-17 MED ORDER — EPHEDRINE SULFATE-NACL 50-0.9 MG/10ML-% IV SOSY
PREFILLED_SYRINGE | INTRAVENOUS | Status: DC | PRN
Start: 2020-12-17 — End: 2020-12-17
  Administered 2020-12-17: 5 mg via INTRAVENOUS

## 2020-12-17 MED ORDER — PHENYLEPHRINE 40 MCG/ML (10ML) SYRINGE FOR IV PUSH (FOR BLOOD PRESSURE SUPPORT)
PREFILLED_SYRINGE | INTRAVENOUS | Status: DC | PRN
  Administered 2020-12-17: 120 ug via INTRAVENOUS
  Administered 2020-12-17: 80 ug via INTRAVENOUS

## 2020-12-17 MED ORDER — KETAMINE HCL 10 MG/ML IJ SOLN
INTRAMUSCULAR | Status: DC | PRN
  Administered 2020-12-17: 20 mg via INTRAVENOUS
  Administered 2020-12-17: 40 mg via INTRAVENOUS

## 2020-12-17 MED ORDER — MEPERIDINE HCL 50 MG/ML IJ SOLN
6.2500 mg | INTRAMUSCULAR | Status: DC | PRN
Start: 2020-12-17 — End: 2020-12-17

## 2020-12-17 MED ORDER — INFLUENZA VAC SPLIT QUAD 0.5 ML IM SUSY
0.5000 mL | PREFILLED_SYRINGE | INTRAMUSCULAR | Status: DC
  Filled 2020-12-17: qty 0.5

## 2020-12-17 MED ORDER — MIDAZOLAM HCL 2 MG/2ML IJ SOLN
INTRAMUSCULAR | Status: AC
  Filled 2020-12-17: qty 2

## 2020-12-17 MED ORDER — ONDANSETRON HCL 4 MG/2ML IJ SOLN
INTRAMUSCULAR | Status: AC
  Filled 2020-12-17: qty 2

## 2020-12-17 MED ORDER — DEXAMETHASONE SODIUM PHOSPHATE 10 MG/ML IJ SOLN
INTRAMUSCULAR | Status: AC
  Filled 2020-12-17: qty 1

## 2020-12-17 MED ORDER — CHLORHEXIDINE GLUCONATE CLOTH 2 % EX PADS: 6.0000 | MEDICATED_PAD | Freq: Once | CUTANEOUS | Status: DC

## 2020-12-17 MED ORDER — SODIUM CHLORIDE 0.9 % IV SOLN
2.0000 g | INTRAVENOUS | Status: AC
  Administered 2020-12-17: 2 g via INTRAVENOUS
  Filled 2020-12-17: qty 2

## 2020-12-17 MED ORDER — BUPIVACAINE LIPOSOME 1.3 % IJ SUSP: 20.0000 mL | Freq: Once | INTRAMUSCULAR | Status: DC

## 2020-12-17 MED ORDER — ROCURONIUM BROMIDE 10 MG/ML (PF) SYRINGE
PREFILLED_SYRINGE | INTRAVENOUS | Status: DC | PRN
  Administered 2020-12-17: 30 mg via INTRAVENOUS
  Administered 2020-12-17: 100 mg via INTRAVENOUS

## 2020-12-17 MED ORDER — ONDANSETRON HCL 4 MG/2ML IJ SOLN: 4.0000 mg | Freq: Four times a day (QID) | INTRAMUSCULAR | Status: DC | PRN

## 2020-12-17 MED ORDER — DILTIAZEM HCL 60 MG PO TABS: 120.0000 mg | ORAL_TABLET | Freq: Two times a day (BID) | ORAL | Status: DC

## 2020-12-17 SURGICAL SUPPLY — 59 items
APPLIER CLIP 5 13 M/L LIGAMAX5 (MISCELLANEOUS)
APPLIER CLIP ROT 13.4 12 LRG (CLIP)
BLADE SURG 15 STRL LF DISP TIS (BLADE) ×1 IMPLANT
BLADE SURG 15 STRL SS (BLADE) ×1
CLIP APPLIE 5 13 M/L LIGAMAX5 (MISCELLANEOUS) IMPLANT
CLIP APPLIE ROT 13.4 12 LRG (CLIP) IMPLANT
CLIP LIGATING HEM O LOK PURPLE (MISCELLANEOUS) IMPLANT
COVER SURGICAL LIGHT HANDLE (MISCELLANEOUS) ×2 IMPLANT
COVER TIP SHEARS 8 DVNC (MISCELLANEOUS) ×1 IMPLANT
COVER TIP SHEARS 8MM DA VINCI (MISCELLANEOUS) ×1
DECANTER SPIKE VIAL GLASS SM (MISCELLANEOUS) ×2 IMPLANT
DERMABOND ADVANCED (GAUZE/BANDAGES/DRESSINGS) ×1
DERMABOND ADVANCED .7 DNX12 (GAUZE/BANDAGES/DRESSINGS) ×1 IMPLANT
DEVICE TROCAR PUNCTURE CLOSURE (ENDOMECHANICALS) IMPLANT
DRAIN PENROSE 0.5X18 (DRAIN) ×2 IMPLANT
DRAPE ARM DVNC X/XI (DISPOSABLE) ×4 IMPLANT
DRAPE COLUMN DVNC XI (DISPOSABLE) ×1 IMPLANT
DRAPE DA VINCI XI ARM (DISPOSABLE) ×4
DRAPE DA VINCI XI COLUMN (DISPOSABLE) ×1
ELECT REM PT RETURN 15FT ADLT (MISCELLANEOUS) ×2 IMPLANT
GAUZE 4X4 16PLY ~~LOC~~+RFID DBL (SPONGE) ×2 IMPLANT
GLOVE SURG ENC TEXT LTX SZ8 (GLOVE) ×4 IMPLANT
GOWN STRL REUS W/TWL XL LVL3 (GOWN DISPOSABLE) ×8 IMPLANT
GRASPER SUT TROCAR 14GX15 (MISCELLANEOUS) IMPLANT
IRRIG SUCT STRYKERFLOW 2 WTIP (MISCELLANEOUS) ×2
IRRIGATION SUCT STRKRFLW 2 WTP (MISCELLANEOUS) ×1 IMPLANT
KIT BASIN OR (CUSTOM PROCEDURE TRAY) ×2 IMPLANT
KIT TURNOVER KIT A (KITS) IMPLANT
MARKER SKIN DUAL TIP RULER LAB (MISCELLANEOUS) ×2 IMPLANT
NEEDLE HYPO 22GX1.5 SAFETY (NEEDLE) ×2 IMPLANT
OBTURATOR OPTICAL STANDARD 8MM (TROCAR) ×1
OBTURATOR OPTICAL STND 8 DVNC (TROCAR) ×1
OBTURATOR OPTICALSTD 8 DVNC (TROCAR) ×1 IMPLANT
PACK CARDIOVASCULAR III (CUSTOM PROCEDURE TRAY) ×2 IMPLANT
PAD POSITIONING PINK XL (MISCELLANEOUS) IMPLANT
SCISSORS LAP 5X45 EPIX DISP (ENDOMECHANICALS) IMPLANT
SEAL CANN UNIV 5-8 DVNC XI (MISCELLANEOUS) ×4 IMPLANT
SEAL XI 5MM-8MM UNIVERSAL (MISCELLANEOUS) ×4
SEALER VESSEL DA VINCI XI (MISCELLANEOUS) ×1
SEALER VESSEL EXT DVNC XI (MISCELLANEOUS) ×1 IMPLANT
SOL ANTI FOG 6CC (MISCELLANEOUS) ×1 IMPLANT
SOLUTION ANTI FOG 6CC (MISCELLANEOUS) ×1
SOLUTION ELECTROLUBE (MISCELLANEOUS) ×2 IMPLANT
SUT ETHIBOND 0 36 GRN (SUTURE) ×6 IMPLANT
SUT MNCRL AB 4-0 PS2 18 (SUTURE) ×4 IMPLANT
SUT VICRYL 0 UR6 27IN ABS (SUTURE) IMPLANT
SYR 10ML ECCENTRIC (SYRINGE) ×2 IMPLANT
SYR 20ML LL LF (SYRINGE) ×2 IMPLANT
TIP INNERVISION DETACH 40FR (MISCELLANEOUS) IMPLANT
TIP INNERVISION DETACH 50FR (MISCELLANEOUS) IMPLANT
TIP INNERVISION DETACH 56FR (MISCELLANEOUS) IMPLANT
TIPS INNERVISION DETACH 40FR (MISCELLANEOUS)
TOWEL OR 17X26 10 PK STRL BLUE (TOWEL DISPOSABLE) ×2 IMPLANT
TRAY FOLEY MTR SLVR 16FR STAT (SET/KITS/TRAYS/PACK) IMPLANT
TROCAR ADV FIXATION 12X100MM (TROCAR) ×2 IMPLANT
TROCAR ADV FIXATION 5X100MM (TROCAR) IMPLANT
TROCAR BLADELESS OPT 5 100 (ENDOMECHANICALS) ×2 IMPLANT
TUBING CONNECTING 10 (TUBING) IMPLANT
TUBING INSUFFLATION 10FT LAP (TUBING) ×2 IMPLANT

## 2020-12-17 NOTE — Op Note (Signed)
Madison Lewis  1970/05/01   12/17/2020    PCP:  [facility]   Surgeon: Wenda Low, MD, FACS  Asst:  Phylliss Blakes, MD, FACS   Angelena Form, MD, FACS  Anes:  general  Preop Dx: Hiatal hernia with [name] ulcers and hx of anemia Postop Dx: Complex type III hiatal hernia in a patient with BMI > 40  Procedure: Xi robotic hiatal hernia repair with endoscopy (x2) and Nissen fundoplication Location Surgery: [facility] OR 5 Complications: none  EBL:   30 cc  Drains: none  Description of Procedure:  The patient was taken to OR 5 .  After anesthesia was administered and the patient was prepped  with chloroprep  and a timeout was performed.  A Foley was inserted.    Access to the abdomen was achieved with a 5 mm Optiview through the left mid abdomen.  Following insufflation the abdomen had been distended by the preoxygenation before a difficult intubation.  The Nathanson retractor was inserted.   The four robotic 8 mm trocars were inserted and the robot was docked.  The foregut was distended with gas and this required upper endoscopy to decompress.    Dissection of the foregut ensued and removed the sack and achieved esophageal length.  The excess sac was removed.    A penrose drain was used to retract the esophagus.  The right and left crura were identified on the right side and a crural repair was done with 0 Ethibond with 2 figure of 8 and a simple suture. Since were were unable to pass an NG or a dilator, I performed a Nissen wrap with three sutures of Ethibond.  When completed, Dr. Fredricka Bonine passed the scope into the stomach and viewed the wrap and no complicating issues were noted.    The ports were injected with Exparel and closed with 4-0 Monocryl and Dermabond     The patient tolerated the procedure well and was taken to the PACU in stable condition.     Matt B. Daphine Deutscher, MD, [facility], [place] [phone]

## 2020-12-17 NOTE — Interval H&P Note (Signed)
History and Physical Interval Note:  12/17/2020 9:51 AM  Madison Lewis  has presented today for surgery, with the diagnosis of GERD WITH CAMERON ULCERS.  The various methods of treatment have been discussed with the patient and family. After consideration of risks, benefits and other options for treatment, the patient has consented to  Procedure(s): XI ROBOTIC ASSISTED HIATAL HERNIA REPAIR WITH FUNDOPLICATION (N/A) as a surgical intervention.  The patient's history has been reviewed, patient examined, no change in status, stable for surgery.  I have reviewed the patient's chart and labs.  Questions were answered to the patient's satisfaction.     Valarie Merino

## 2020-12-17 NOTE — Anesthesia Procedure Notes (Signed)
Procedure Name: Intubation Date/Time: 12/17/2020 10:38 AM Performed by: Gerald Leitz, CRNA Pre-anesthesia Checklist: Patient identified, Patient being monitored, Timeout performed, Emergency Drugs available and Suction available Patient Re-evaluated:Patient Re-evaluated prior to induction Oxygen Delivery Method: Circle system utilized Preoxygenation: Pre-oxygenation with 100% oxygen Induction Type: IV induction Ventilation: Mask ventilation without difficulty Laryngoscope Size: Mac, 4 and Glidescope Grade View: Grade I Tube type: Oral Tube size: 7.0 mm Number of attempts: 2 Airway Equipment and Method: Stylet Placement Confirmation: ETT inserted through vocal cords under direct vision, positive ETCO2 and breath sounds checked- equal and bilateral Secured at: 23 cm Tube secured with: Tape Dental Injury: Teeth and Oropharynx as per pre-operative assessment  Difficulty Due To: Difficulty was unanticipated, Difficult Airway- due to anterior larynx and Difficult Airway-  due to edematous airway

## 2020-12-17 NOTE — Transfer of Care (Signed)
Immediate Anesthesia Transfer of Care Note  Patient: Madison Lewis  Procedure(s) Performed: Procedure(s): XI ROBOTIC ASSISTED HIATAL HERNIA REPAIR WITH FUNDOPLICATION (N/A)  Patient Location: PACU  Anesthesia Type:General  Level of Consciousness: Alert, Awake, Oriented  Airway & Oxygen Therapy: Patient Spontanous Breathing  Post-op Assessment: Report given to RN  Post vital signs: Reviewed and stable  Last Vitals:  Vitals:   12/17/20 0744  BP: (!) 220/110  Pulse: 96  Resp: 19  Temp: 37 C  SpO2: 95%    Complications: No apparent anesthesia complications

## 2020-12-18 DIAGNOSIS — K449 Diaphragmatic hernia without obstruction or gangrene: Secondary | ICD-10-CM | POA: Diagnosis not present

## 2020-12-18 LAB — CBC
HCT: 36.2 % (ref 36.0–46.0)
Hemoglobin: 10.8 g/dL — ABNORMAL LOW (ref 12.0–15.0)
MCH: 26 pg (ref 26.0–34.0)
MCHC: 29.8 g/dL — ABNORMAL LOW (ref 30.0–36.0)
MCV: 87 fL (ref 80.0–100.0)
Platelets: 266 10*3/uL (ref 150–400)
RBC: 4.16 MIL/uL (ref 3.87–5.11)
RDW: 16.6 % — ABNORMAL HIGH (ref 11.5–15.5)
WBC: 8 10*3/uL (ref 4.0–10.5)
nRBC: 0 % (ref 0.0–0.2)

## 2020-12-18 LAB — COMPREHENSIVE METABOLIC PANEL
ALT: 62 U/L — ABNORMAL HIGH (ref 0–44)
AST: 71 U/L — ABNORMAL HIGH (ref 15–41)
Albumin: 3.3 g/dL — ABNORMAL LOW (ref 3.5–5.0)
Alkaline Phosphatase: 73 U/L (ref 38–126)
Anion gap: 4 — ABNORMAL LOW (ref 5–15)
BUN: 28 mg/dL — ABNORMAL HIGH (ref 6–20)
CO2: 24 mmol/L (ref 22–32)
Calcium: 8 mg/dL — ABNORMAL LOW (ref 8.9–10.3)
Chloride: 106 mmol/L (ref 98–111)
Creatinine, Ser: 1.97 mg/dL — ABNORMAL HIGH (ref 0.44–1.00)
GFR, Estimated: 30 mL/min — ABNORMAL LOW (ref >60)
Glucose, Bld: 137 mg/dL — ABNORMAL HIGH (ref 70–99)
Potassium: 4.6 mmol/L (ref 3.5–5.1)
Sodium: 134 mmol/L — ABNORMAL LOW (ref 135–145)
Total Bilirubin: 0.2 mg/dL — ABNORMAL LOW (ref 0.3–1.2)
Total Protein: 6.9 g/dL (ref 6.5–8.1)

## 2020-12-18 MED ORDER — HYDROCODONE-ACETAMINOPHEN 5-325 MG PO TABS: 1.0000 | ORAL_TABLET | ORAL | 0 refills | Status: DC | PRN

## 2020-12-18 NOTE — Progress Notes (Signed)
Assessment unchanged. Tolerated full liquid diet and ready to go home. Pt verbalized understanding of dc instructions through teach back including medications, follow-up care and when to call the doctor. Dc'd via wc to front entrance accompanied by NT.

## 2020-12-18 NOTE — Discharge Instructions (Signed)
LAPAROSCOPIC SURGERY: POST OP INSTRUCTIONS  DIET: Follow your liquid diet for the next 2 weeks.  Be sure to include lots of fluids daily.  Avoid fast food or heavy meals as your are more likely to get nauseated.  Eat a low fat the next few days after surgery.   Take your usually prescribed home medications unless otherwise directed. PAIN CONTROL: Pain is best controlled by a usual combination of three different methods TOGETHER: Ice/Heat Over the counter pain medication Prescription pain medication Most patients will experience some swelling and bruising around the incisions.  Ice packs or heating pads (30-60 minutes up to 6 times a day) will help. Use ice for the first few days to help decrease swelling and bruising, then switch to heat to help relax tight/sore spots and speed recovery.  Some people prefer to use ice alone, heat alone, alternating between ice & heat.  Experiment to what works for you.  Swelling and bruising can take several weeks to resolve.   It is helpful to take an over-the-counter pain medication regularly for the first few weeks.  Choose one of the following that works best for you: Naproxen (Aleve, etc)  Two 220mg  tabs twice a day Ibuprofen (Advil, etc) Three 200mg  tabs four times a day (every meal & bedtime) A  prescription for pain medication (such as percocet, vicodin, oxycodone, hydrocodone, etc) should be given to you upon discharge.  Take your pain medication as prescribed.  If you are having problems/concerns with the prescription medicine (does not control pain, nausea, vomiting, rash, itching, etc), please call us [phone] to see if we need to switch you to a different pain medicine that will work better for you and/or control your side effect better. If you need a refill on your pain medication, please contact your pharmacy.  They will contact our office to request authorization. Prescriptions will not be filled after 5 pm or on week-ends.   Avoid getting  constipated.  Between the surgery and the pain medications, it is common to experience some constipation.  Increasing fluid intake and taking a fiber supplement (such as Metamucil, Citrucel, FiberCon, MiraLax, etc) 1-2 times a day regularly will usually help prevent this problem from occurring.  A mild laxative (prune juice, Milk of Magnesia, MiraLax, etc) should be taken according to package directions if there are no bowel movements after 48 hours.   Watch out for diarrhea.  If you have many loose bowel movements, simplify your diet to bland foods & liquids for a few days.  Stop any stool softeners and decrease your fiber supplement.  Switching to mild anti-diarrheal medications (Kayopectate, Pepto Bismol) can help.  If this worsens or does not improve, please call us. Wash / shower every day.  You may shower over the dressings as they are waterproof.  Continue to shower over incision(s) after the dressing is off. Remove your waterproof bandages 5 days after surgery.  You may leave the incision open to air.  You may replace a dressing/Band-Aid to cover the incision for comfort if you wish.  ACTIVITIES as tolerated:   You may resume regular (light) daily activities beginning the next day--such as daily self-care, walking, climbing stairs--gradually increasing activities as tolerated.  If you can walk 30 minutes without difficulty, it is safe to try more intense activity such as jogging, treadmill, bicycling, low-impact aerobics, swimming, etc. Save the most intensive and strenuous activity for last such as sit-ups, heavy lifting, contact sports, etc  Refrain from any heavy  lifting or straining until you are off narcotics for pain control.   DO NOT PUSH THROUGH PAIN.  Let pain be your guide: If it hurts to do something, don't do it.  Pain is your body warning you to avoid that activity for another week until the pain goes down. You may drive when you are no longer taking prescription pain medication, you can  comfortably wear a seatbelt, and you can safely maneuver your car and apply brakes. You may have sexual intercourse when it is comfortable.  FOLLOW UP in our office Please call [facility] at [phone] to set up an appointment to see your surgeon in the office for a follow-up appointment approximately 2-3 weeks after your surgery. Make sure that you call for this appointment the day you arrive home to insure a convenient appointment time. 10. IF YOU HAVE DISABILITY OR FAMILY LEAVE FORMS, BRING THEM TO THE OFFICE FOR PROCESSING.  DO NOT GIVE THEM TO YOUR DOCTOR.   WHEN TO CALL us [phone]: Poor pain control Reactions / problems with new medications (rash/itching, nausea, etc)  Fever over 101.5 F (38.5 C) Inability to urinate Nausea and/or vomiting Worsening swelling or bruising Continued bleeding from incision. Increased pain, redness, or drainage from the incision   The clinic staff is available to answer your questions during regular business hours (8:30am-5pm).  Please don't hesitate to call and ask to speak to one of our nurses for clinical concerns.   If you have a medical emergency, go to the nearest emergency room or call 911.  A surgeon from [facility] is always on call at the [facility], [place] [address], [address], [place], [place]  [address] ? MAIN: [phone] ? TOLL FREE: [phone] ?  FAX [phone] [email or web address]

## 2020-12-18 NOTE — Progress Notes (Signed)
1 Day Post-Op Robotic Nissen Subjective: No complaints, tolerating clears  Objective: Vital signs in last 24 hours: Temp:  [97.4 F (36.3 C)-98.5 F (36.9 C)] 97.4 F (36.3 C) (12/03 0533) Pulse Rate:  [81-88] 83 (12/03 0533) Resp:  [14-18] 18 (12/03 0533) BP: (136-176)/(81-109) 155/95 (12/03 0533) SpO2:  [89 %-100 %] 94 % (12/03 0533) Weight:  [111.1 kg] 111.1 kg (12/02 1600)   Intake/Output from previous day: 12/02 0701 - 12/03 0700 In: 3735 [P.O.:1190; I.V.:2345; IV Piggyback:200] Out: 1820 [Urine:1800; Blood:20] Intake/Output this shift: No intake/output data recorded.   General appearance: alert and cooperative GI: soft, non-distended  Incision: no significant drainage  Lab Results:  Recent Labs    12/17/20 1524 12/18/20 0538  WBC 13.0* 8.0  HGB 11.2* 10.8*  HCT 37.4 36.2  PLT 228 266   BMET Recent Labs    12/17/20 1524 12/18/20 0538  NA  --  134*  K  --  4.6  CL  --  106  CO2  --  24  GLUCOSE  --  137*  BUN  --  28*  CREATININE 2.17* 1.97*  CALCIUM  --  8.0*   PT/INR No results for input(s): LABPROT, INR in the last 72 hours. ABG No results for input(s): PHART, HCO3 in the last 72 hours.  Invalid input(s): PCO2, PO2  MEDS, Scheduled  diltiazem  120 mg Oral Q12H   heparin injection (subcutaneous)  5,000 Units Subcutaneous Q8H   influenza vac split quadrivalent PF  0.5 mL Intramuscular Tomorrow-1000   metoprolol succinate  100 mg Oral Daily   pantoprazole (PROTONIX) IV  40 mg Intravenous QHS    Studies/Results: No results found.  Assessment: s/p Procedure(s): XI ROBOTIC ASSISTED HIATAL HERNIA REPAIR WITH FUNDOPLICATION Patient Active Problem List   Diagnosis Date Noted   Status post laparoscopic Nissen fundoplication 12/17/2020   Esophageal dysphagia    Hiatal hernia    Gastric erythema    Intestinal lymphangiectasia    Duodenal nodule    Atrial fibrillation (HCC) 08/19/2019   Chest pain 08/18/2019   Atrial fibrillation with RVR  (HCC) 08/18/2019   Iron deficiency anemia due to chronic blood loss 07/14/2018   Anemia 07/06/2018   Uncontrolled hypertension 06/14/2018   Iron deficiency anemia    Hematochezia    Symptomatic anemia 05/17/2016   Prediabetes 03/24/2015   Chronic tension-type headache, not intractable 03/18/2015   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   Essential hypertension 03/18/2015    Expected post op course  Plan: Advance diet to fulls Ok to d/c home if tolerating fulls   LOS: 1 day     .Vanita Panda, MD [facility], [place]    12/18/2020 9:44 AM

## 2020-12-18 NOTE — Plan of Care (Signed)
  Problem: Education: Goal: Required Educational Video(s) Outcome: Adequate for Discharge   Problem: Clinical Measurements: Goal: Ability to maintain clinical measurements within normal limits will improve Outcome: Adequate for Discharge Goal: Postoperative complications will be avoided or minimized Outcome: Adequate for Discharge   Problem: Skin Integrity: Goal: Demonstration of wound healing without infection will improve Outcome: Adequate for Discharge   

## 2020-12-18 NOTE — Progress Notes (Signed)
Notified IV out with order to leave out .

## 2020-12-20 ENCOUNTER — Encounter (HOSPITAL_COMMUNITY): Payer: Self-pay | Admitting: Surgery

## 2020-12-30 NOTE — Discharge Summary (Signed)
Physician Discharge Summary  Patient ID: Madison Lewis MRN: [id number] DOB/AGE: February 13, 1970 50 y.o.  PCP: [facility]  Admit date: 12/17/2020 Discharge date: 12/30/2020  Admission Diagnoses:  Hiatal hernia with cameron ulcers and hx of anemia  Discharge Diagnoses:  same  Principal Problem:   Status post laparoscopic Nissen fundoplication   Surgery:  robotic hiatal hernia repair with Nissen fundoplicaiton  Discharged Condition: improved  Hospital Course:   had surgery on a Friday.  Was begun on clear liquids and advanced and was ready for discharge on Sunday  Consults: none  Significant Diagnostic Studies: none    Discharge Exam: Blood pressure (!) 168/89, pulse 64, temperature (!) 97.5 F (36.4 C), temperature source Oral, resp. rate 18, height 5\' 4"  (1.626 m), weight 111.1 kg, SpO2 95 %. Incision inspection by Dr. didn't see the patient over the weekend  Disposition: Discharge disposition: 01-Home or Self Care        Allergies as of 12/18/2020   No Known Allergies      Medication List     STOP taking these medications    acetaminophen 500 MG tablet Commonly known as: TYLENOL   traMADol 50 MG tablet Commonly known as: ULTRAM       TAKE these medications    albuterol 108 (90 Base) MCG/ACT inhaler Commonly known as: VENTOLIN HFA Inhale 2 puffs into the lungs every 4 (four) hours as needed for wheezing.   atorvastatin 40 MG tablet Commonly known as: LIPITOR Take 40 mg by mouth daily.   diltiazem 120 MG tablet Commonly known as: CARDIZEM Take 120 mg by mouth in the morning and at bedtime.   ferrous sulfate 325 (65 FE) MG tablet Take 325 mg by mouth daily with breakfast.   fluticasone 50 MCG/ACT nasal spray Commonly known as: FLONASE Place 1 spray into both nostrils 2 (two) times daily as needed for allergies.   gabapentin 300 MG capsule Commonly known as: NEURONTIN Take 300 mg by mouth at bedtime as needed  (pain).   HYDROcodone-acetaminophen 5-325 MG tablet Commonly known as: NORCO/VICODIN Take 1-2 tablets by mouth every 4 (four) hours as needed for moderate pain.   methocarbamol 500 MG tablet Commonly known as: ROBAXIN Take 500 mg by mouth 2 (two) times daily as needed for muscle spasms.   metoprolol succinate 100 MG 24 hr tablet Commonly known as: TOPROL-XL Take 100 mg by mouth daily. Take with or immediately following a meal.   multivitamin with minerals Tabs tablet Take 1 tablet by mouth daily.   vitamin B-12 1000 MCG tablet Commonly known as: CYANOCOBALAMIN Take 1 tablet (1,000 mcg total) by mouth daily.   Vitamin D (Ergocalciferol) 1.25 MG (50000 UNIT) Caps capsule Commonly known as: DRISDOL Take 50,000 Units by mouth every Wednesday.        Follow-up Information     Sunday, MD Follow up in 1 month(s).   Specialty: General Surgery Contact information: [address] [place] [phone]                 Signed: [phone] 12/30/2020, 12:42 PM

## 2021-01-06 NOTE — Anesthesia Postprocedure Evaluation (Signed)
Anesthesia Post Note  Patient: Dance movement psychotherapist  Procedure(s) Performed: XI ROBOTIC ASSISTED HIATAL HERNIA REPAIR WITH FUNDOPLICATION (Abdomen)     Patient location during evaluation: PACU Anesthesia Type: General Level of consciousness: awake Pain management: pain level controlled Vital Signs Assessment: post-procedure vital signs reviewed and stable Respiratory status: spontaneous breathing Cardiovascular status: stable Postop Assessment: no apparent nausea or vomiting Anesthetic complications: no   No notable events documented.  Last Vitals:  Vitals:   12/18/20 0533 12/18/20 1340  BP: (!) 155/95 (!) 168/89  Pulse: 83 64  Resp: 18 18  Temp: (!) 36.3 C (!) 36.4 C  SpO2: 94% 95%    Last Pain:  Vitals:   12/18/20 1658  TempSrc:   PainSc: 3                  John F Kamaria Lucia Jr

## 2021-05-10 ENCOUNTER — Other Ambulatory Visit: Payer: Self-pay | Admitting: Family Medicine

## 2021-05-10 DIAGNOSIS — Z1231 Encounter for screening mammogram for malignant neoplasm of breast: Secondary | ICD-10-CM

## 2021-06-20 ENCOUNTER — Ambulatory Visit
Admission: RE | Admit: 2021-06-20 | Discharge: 2021-06-20 | Disposition: A | Discharge status: Home / Self Care | Payer: BC Managed Care – PPO | Source: Ambulatory Visit | Attending: Family Medicine | Admitting: Family Medicine

## 2021-06-20 DIAGNOSIS — Z1231 Encounter for screening mammogram for malignant neoplasm of breast: Secondary | ICD-10-CM | POA: Diagnosis not present

## 2021-12-29 DIAGNOSIS — M170 Bilateral primary osteoarthritis of knee: Secondary | ICD-10-CM | POA: Diagnosis not present

## 2021-12-29 DIAGNOSIS — M25562 Pain in left knee: Secondary | ICD-10-CM | POA: Diagnosis not present

## 2021-12-29 DIAGNOSIS — M25561 Pain in right knee: Secondary | ICD-10-CM | POA: Diagnosis not present

## 2021-12-29 DIAGNOSIS — M545 Low back pain, unspecified: Secondary | ICD-10-CM | POA: Diagnosis not present

## 2021-12-30 DIAGNOSIS — I10 Essential (primary) hypertension: Secondary | ICD-10-CM | POA: Diagnosis not present

## 2021-12-30 DIAGNOSIS — I4891 Unspecified atrial fibrillation: Secondary | ICD-10-CM | POA: Diagnosis not present

## 2021-12-30 DIAGNOSIS — R9431 Abnormal electrocardiogram [ECG] [EKG]: Secondary | ICD-10-CM | POA: Diagnosis not present

## 2021-12-30 DIAGNOSIS — Z79899 Other long term (current) drug therapy: Secondary | ICD-10-CM | POA: Diagnosis not present

## 2021-12-30 DIAGNOSIS — R519 Headache, unspecified: Secondary | ICD-10-CM | POA: Diagnosis not present

## 2021-12-30 DIAGNOSIS — I169 Hypertensive crisis, unspecified: Secondary | ICD-10-CM | POA: Diagnosis not present

## 2021-12-31 DIAGNOSIS — I169 Hypertensive crisis, unspecified: Secondary | ICD-10-CM | POA: Diagnosis not present

## 2022-01-06 DIAGNOSIS — M170 Bilateral primary osteoarthritis of knee: Secondary | ICD-10-CM | POA: Diagnosis not present

## 2022-02-01 ENCOUNTER — Other Ambulatory Visit: Payer: Self-pay | Admitting: Nephrology

## 2022-02-01 DIAGNOSIS — N1832 Chronic kidney disease, stage 3b: Secondary | ICD-10-CM | POA: Diagnosis not present

## 2022-02-01 DIAGNOSIS — I10 Essential (primary) hypertension: Secondary | ICD-10-CM | POA: Diagnosis not present

## 2022-02-01 DIAGNOSIS — E1122 Type 2 diabetes mellitus with diabetic chronic kidney disease: Secondary | ICD-10-CM | POA: Diagnosis not present

## 2022-02-07 ENCOUNTER — Ambulatory Visit
Admission: RE | Admit: 2022-02-07 | Discharge: 2022-02-07 | Disposition: A | Discharge status: Home / Self Care | Payer: BC Managed Care – PPO | Source: Ambulatory Visit | Attending: Nephrology | Admitting: Nephrology

## 2022-02-07 DIAGNOSIS — E1122 Type 2 diabetes mellitus with diabetic chronic kidney disease: Secondary | ICD-10-CM | POA: Diagnosis present

## 2022-02-07 DIAGNOSIS — N1832 Chronic kidney disease, stage 3b: Secondary | ICD-10-CM | POA: Diagnosis not present

## 2022-03-14 DIAGNOSIS — I10 Essential (primary) hypertension: Secondary | ICD-10-CM | POA: Diagnosis not present

## 2022-03-14 DIAGNOSIS — N1832 Chronic kidney disease, stage 3b: Secondary | ICD-10-CM | POA: Diagnosis not present

## 2022-03-14 DIAGNOSIS — E118 Type 2 diabetes mellitus with unspecified complications: Secondary | ICD-10-CM | POA: Diagnosis not present

## 2022-03-14 DIAGNOSIS — E1122 Type 2 diabetes mellitus with diabetic chronic kidney disease: Secondary | ICD-10-CM | POA: Diagnosis not present

## 2022-03-14 DIAGNOSIS — R809 Proteinuria, unspecified: Secondary | ICD-10-CM | POA: Diagnosis not present

## 2022-03-22 DIAGNOSIS — M1990 Unspecified osteoarthritis, unspecified site: Secondary | ICD-10-CM | POA: Diagnosis not present

## 2022-03-22 DIAGNOSIS — E669 Obesity, unspecified: Secondary | ICD-10-CM | POA: Diagnosis not present

## 2022-03-22 DIAGNOSIS — E785 Hyperlipidemia, unspecified: Secondary | ICD-10-CM | POA: Diagnosis not present

## 2022-03-22 DIAGNOSIS — Z01810 Encounter for preprocedural cardiovascular examination: Secondary | ICD-10-CM | POA: Diagnosis not present

## 2022-03-22 DIAGNOSIS — D6869 Other thrombophilia: Secondary | ICD-10-CM | POA: Diagnosis not present

## 2022-03-22 DIAGNOSIS — Z8249 Family history of ischemic heart disease and other diseases of the circulatory system: Secondary | ICD-10-CM | POA: Diagnosis not present

## 2022-03-22 DIAGNOSIS — R0609 Other forms of dyspnea: Secondary | ICD-10-CM | POA: Diagnosis not present

## 2022-03-22 DIAGNOSIS — M25472 Effusion, left ankle: Secondary | ICD-10-CM | POA: Diagnosis not present

## 2022-03-22 DIAGNOSIS — J45909 Unspecified asthma, uncomplicated: Secondary | ICD-10-CM | POA: Diagnosis not present

## 2022-03-22 DIAGNOSIS — E1140 Type 2 diabetes mellitus with diabetic neuropathy, unspecified: Secondary | ICD-10-CM | POA: Diagnosis not present

## 2022-03-22 DIAGNOSIS — Z823 Family history of stroke: Secondary | ICD-10-CM | POA: Diagnosis not present

## 2022-03-22 DIAGNOSIS — D508 Other iron deficiency anemias: Secondary | ICD-10-CM | POA: Diagnosis not present

## 2022-03-22 DIAGNOSIS — E118 Type 2 diabetes mellitus with unspecified complications: Secondary | ICD-10-CM | POA: Diagnosis not present

## 2022-03-22 DIAGNOSIS — M25471 Effusion, right ankle: Secondary | ICD-10-CM | POA: Diagnosis not present

## 2022-03-22 DIAGNOSIS — E1122 Type 2 diabetes mellitus with diabetic chronic kidney disease: Secondary | ICD-10-CM | POA: Diagnosis not present

## 2022-03-22 DIAGNOSIS — I129 Hypertensive chronic kidney disease with stage 1 through stage 4 chronic kidney disease, or unspecified chronic kidney disease: Secondary | ICD-10-CM | POA: Diagnosis not present

## 2022-03-22 DIAGNOSIS — E789 Disorder of lipoprotein metabolism, unspecified: Secondary | ICD-10-CM | POA: Diagnosis not present

## 2022-03-22 DIAGNOSIS — I4891 Unspecified atrial fibrillation: Secondary | ICD-10-CM | POA: Diagnosis not present

## 2022-03-22 DIAGNOSIS — I10 Essential (primary) hypertension: Secondary | ICD-10-CM | POA: Diagnosis not present

## 2022-03-22 DIAGNOSIS — Z833 Family history of diabetes mellitus: Secondary | ICD-10-CM | POA: Diagnosis not present

## 2022-03-22 DIAGNOSIS — K5900 Constipation, unspecified: Secondary | ICD-10-CM | POA: Diagnosis not present

## 2022-04-21 DIAGNOSIS — N183 Chronic kidney disease, stage 3 unspecified: Secondary | ICD-10-CM | POA: Diagnosis not present

## 2022-04-21 DIAGNOSIS — G4733 Obstructive sleep apnea (adult) (pediatric): Secondary | ICD-10-CM | POA: Diagnosis not present

## 2022-04-21 DIAGNOSIS — Z01810 Encounter for preprocedural cardiovascular examination: Secondary | ICD-10-CM | POA: Diagnosis not present

## 2022-04-21 DIAGNOSIS — E1122 Type 2 diabetes mellitus with diabetic chronic kidney disease: Secondary | ICD-10-CM | POA: Diagnosis not present

## 2022-04-21 DIAGNOSIS — I10 Essential (primary) hypertension: Secondary | ICD-10-CM | POA: Diagnosis not present

## 2022-05-08 DIAGNOSIS — I10 Essential (primary) hypertension: Secondary | ICD-10-CM | POA: Diagnosis not present

## 2022-05-29 DIAGNOSIS — I10 Essential (primary) hypertension: Secondary | ICD-10-CM | POA: Diagnosis not present

## 2022-05-29 DIAGNOSIS — Z96652 Presence of left artificial knee joint: Secondary | ICD-10-CM | POA: Diagnosis not present

## 2022-07-13 DIAGNOSIS — E1122 Type 2 diabetes mellitus with diabetic chronic kidney disease: Secondary | ICD-10-CM | POA: Diagnosis not present

## 2022-07-13 DIAGNOSIS — I10 Essential (primary) hypertension: Secondary | ICD-10-CM | POA: Diagnosis not present

## 2022-07-13 DIAGNOSIS — R809 Proteinuria, unspecified: Secondary | ICD-10-CM | POA: Diagnosis not present

## 2022-07-13 DIAGNOSIS — N1832 Chronic kidney disease, stage 3b: Secondary | ICD-10-CM | POA: Diagnosis not present

## 2022-08-16 ENCOUNTER — Other Ambulatory Visit: Payer: Self-pay | Admitting: Family Medicine

## 2022-08-16 DIAGNOSIS — Z1231 Encounter for screening mammogram for malignant neoplasm of breast: Secondary | ICD-10-CM

## 2022-09-14 ENCOUNTER — Ambulatory Visit: Payer: BC Managed Care – PPO

## 2022-10-10 ENCOUNTER — Ambulatory Visit: Payer: BC Managed Care – PPO

## 2022-11-13 DIAGNOSIS — N1832 Chronic kidney disease, stage 3b: Secondary | ICD-10-CM | POA: Diagnosis not present

## 2022-11-13 DIAGNOSIS — I10 Essential (primary) hypertension: Secondary | ICD-10-CM | POA: Diagnosis not present

## 2022-11-13 DIAGNOSIS — E1122 Type 2 diabetes mellitus with diabetic chronic kidney disease: Secondary | ICD-10-CM | POA: Diagnosis not present

## 2022-11-20 ENCOUNTER — Other Ambulatory Visit (INDEPENDENT_AMBULATORY_CARE_PROVIDER_SITE_OTHER): Payer: Self-pay | Admitting: Nephrology

## 2022-11-20 DIAGNOSIS — I10 Essential (primary) hypertension: Secondary | ICD-10-CM

## 2022-11-20 DIAGNOSIS — N1832 Chronic kidney disease, stage 3b: Secondary | ICD-10-CM

## 2022-11-22 ENCOUNTER — Ambulatory Visit (INDEPENDENT_AMBULATORY_CARE_PROVIDER_SITE_OTHER): Payer: BC Managed Care – PPO

## 2022-11-22 DIAGNOSIS — I10 Essential (primary) hypertension: Secondary | ICD-10-CM | POA: Diagnosis not present

## 2022-11-22 DIAGNOSIS — N1832 Chronic kidney disease, stage 3b: Secondary | ICD-10-CM | POA: Diagnosis not present

## 2023-02-21 ENCOUNTER — Ambulatory Visit
Admission: RE | Admit: 2023-02-21 | Discharge: 2023-02-21 | Disposition: A | Discharge status: Home / Self Care | Payer: BC Managed Care – PPO | Source: Ambulatory Visit | Attending: Family Medicine | Admitting: Family Medicine

## 2023-02-21 DIAGNOSIS — Z1231 Encounter for screening mammogram for malignant neoplasm of breast: Secondary | ICD-10-CM | POA: Diagnosis present

## 2023-03-06 ENCOUNTER — Ambulatory Visit: Payer: BC Managed Care – PPO | Admitting: Certified Nurse Midwife

## 2023-03-06 ENCOUNTER — Encounter: Payer: Self-pay | Admitting: Certified Nurse Midwife

## 2023-03-06 ENCOUNTER — Encounter: Payer: Self-pay | Admitting: Oncology

## 2023-03-06 ENCOUNTER — Other Ambulatory Visit (HOSPITAL_COMMUNITY)
Admission: RE | Admit: 2023-03-06 | Discharge: 2023-03-06 | Disposition: A | Discharge status: Home / Self Care | Payer: 59 | Source: Ambulatory Visit | Attending: Certified Nurse Midwife | Admitting: Certified Nurse Midwife

## 2023-03-06 VITALS — BP 146/88 | HR 65 | Ht 64.0 in | Wt 209.3 lb

## 2023-03-06 DIAGNOSIS — Z01419 Encounter for gynecological examination (general) (routine) without abnormal findings: Secondary | ICD-10-CM | POA: Insufficient documentation

## 2023-03-06 DIAGNOSIS — Z113 Encounter for screening for infections with a predominantly sexual mode of transmission: Secondary | ICD-10-CM

## 2023-03-06 DIAGNOSIS — N939 Abnormal uterine and vaginal bleeding, unspecified: Secondary | ICD-10-CM | POA: Insufficient documentation

## 2023-03-06 NOTE — Assessment & Plan Note (Signed)
-  Endorses two episodes of BRB while wiping in the past week.  -Denies recent intercourse or vaginal insertion. Additionally denies medication changes, weight loss or gain. Has not cycled in past 5 years since ablation.  -Atrophy of vaginal tissue on exam. Vaginal walls bleeding with speculum insertion.  -Vaginitis swab collected with Pap.  -Discussed vaginal estrogen as next step if infections are ruled out.

## 2023-03-06 NOTE — Assessment & Plan Note (Signed)
-   Reviewed health maintenance topics as documented below. - Most recent pap smear in 2020, pap smear collected today.. -  Mammogram UTD - Likely menopausal - labs collected  - STI screening accepted.

## 2023-03-06 NOTE — Progress Notes (Signed)
Outpatient Gynecology Note: Annual Visit  Assessment/Plan:    Madison Lewis is a 53 y.o. female [phone] with normal well-woman gynecologic exam.   Well woman exam with routine gynecological exam - Reviewed health maintenance topics as documented below. - Most recent pap smear in 2020, pap smear collected today.. -  Mammogram UTD - Likely menopausal - labs collected  - STI screening accepted.   Vaginal bleeding -Endorses two episodes of BRB while wiping in the past week.  -Denies recent intercourse or vaginal insertion. Additionally denies medication changes, weight loss or gain. Has not cycled in past 5 years since ablation.  -Atrophy of vaginal tissue on exam. Vaginal walls bleeding with speculum insertion.  -Vaginitis swab collected with Pap.  -Discussed vaginal estrogen as next step if infections are ruled out.      Risk factors identified in Subjective to review:  Orders Placed This Encounter  Procedures   FSH/LH   HEP, RPR, HIV Panel   Current Outpatient Medications  Medication Instructions   acetaminophen (TYLENOL) 500 MG tablet Take by mouth.   albuterol (VENTOLIN HFA) 108 (90 Base) MCG/ACT inhaler 2 puffs, Every 4 hours PRN   amLODipine (NORVASC) 5 MG tablet    atorvastatin (LIPITOR) 40 mg, Daily   benzonatate (TESSALON) 200 MG capsule Take by mouth.   Blood Glucose Monitoring Suppl (ACCU-CHEK GUIDE) w/Device KIT See admin instructions   carvedilol (COREG) 6.25 mg, 2 times daily   chlorthalidone (HYGROTON) 25 mg, Daily   cyanocobalamin (VITAMIN B12) 1,000 mcg, Oral, Daily   diltiazem (CARDIZEM) 120 mg, 2 times daily   Docusate Sodium (DSS) 100 MG CAPS 1 capsule, Daily   ferrous sulfate 325 mg, Daily with breakfast   fluticasone (FLONASE) 50 MCG/ACT nasal spray 1 spray, 2 times daily PRN   gabapentin (NEURONTIN) 300 mg, At bedtime PRN   glucose blood (PRECISION QID TEST) test strip 1 each (1 strip total) 3 (three) times daily Use as instructed.    glucose blood (PRECISION QID TEST) test strip by Miscellaneous route.   hydrochlorothiazide (HYDRODIURIL) 25 MG tablet    losartan (COZAAR) 50 mg, 2 times daily   methocarbamol (ROBAXIN) 500 mg, 2 times daily PRN   Multiple Vitamin (MULTIVITAMIN WITH MINERALS) TABS tablet 1 tablet, Oral, Daily   OZEMPIC, 2 MG/DOSE, 8 MG/3ML SOPN Inject into the skin.   Vitamin D (Ergocalciferol) (DRISDOL) 50,000 Units, Every Wed    No follow-ups on file.    Subjective:    Madison Lewis is a 53 y.o. female [phone] who presents for annual wellness visit.   Occupation works 3rd shift at [employer].      CONCERNS? Has had two episodes of BRB while wiping in the last week.   Well Woman Visit:  GYN HISTORY:  No LMP recorded. Patient has had an ablation.     Menstrual History: OB History     Gravida  4   Para  3   Term  3   Preterm      AB  1   Living  3      SAB  1   IAB      Ectopic      Multiple      Live Births  3        Obstetric Comments  S/p BTL         No LMP recorded. Patient has had an ablation.     Sexually active: Yes - infrequent. Endorses discomfort Last pap:was normal  History of abnormal Pap: No Gardasil series:  no STI/HIV testing or immunizations needed? Yes.    Contraceptive methods: BTL  Health Maintenance > Reviewed breast self-awareness > History of abnormal mammogram: No > Body mass index is 35.93 kg/m.    Tobacco Use: Low Risk  (02/21/2023)   Patient History    Smoking Tobacco Use: Never    Smokeless Tobacco Use: Never    Passive Exposure: Not on file      If >50:  Last mammogram:  02/26/2023 - negative   _________________________________________________________  Current Outpatient Medications  Medication Sig Dispense Refill   acetaminophen (TYLENOL) 500 MG tablet Take by mouth.     albuterol (VENTOLIN HFA) 108 (90 Base) MCG/ACT inhaler Inhale 2 puffs into the lungs every 4 (four) hours as needed for wheezing.      amLODipine (NORVASC) 5 MG tablet      atorvastatin (LIPITOR) 40 MG tablet Take 40 mg by mouth daily.     benzonatate (TESSALON) 200 MG capsule Take by mouth.     Blood Glucose Monitoring Suppl (ACCU-CHEK GUIDE) w/Device KIT See admin instructions.     carvedilol (COREG) 6.25 MG tablet Take 6.25 mg by mouth 2 (two) times daily.     chlorthalidone (HYGROTON) 25 MG tablet Take 25 mg by mouth daily.     diltiazem (CARDIZEM) 120 MG tablet Take 120 mg by mouth in the morning and at bedtime.     Docusate Sodium (DSS) 100 MG CAPS Take 1 capsule by mouth daily.     ferrous sulfate 325 (65 FE) MG tablet Take 325 mg by mouth daily with breakfast.     fluticasone (FLONASE) 50 MCG/ACT nasal spray Place 1 spray into both nostrils 2 (two) times daily as needed for allergies.     gabapentin (NEURONTIN) 300 MG capsule Take 300 mg by mouth at bedtime as needed (pain).     glucose blood (PRECISION QID TEST) test strip 1 each (1 strip total) 3 (three) times daily Use as instructed.     glucose blood (PRECISION QID TEST) test strip by Miscellaneous route.     losartan (COZAAR) 50 MG tablet Take 50 mg by mouth 2 (two) times daily.     methocarbamol (ROBAXIN) 500 MG tablet Take 500 mg by mouth 2 (two) times daily as needed for muscle spasms.     Multiple Vitamin (MULTIVITAMIN WITH MINERALS) TABS tablet Take 1 tablet by mouth daily.     OZEMPIC, 2 MG/DOSE, 8 MG/3ML SOPN Inject into the skin.     vitamin B-12 (CYANOCOBALAMIN) 1000 MCG tablet Take 1 tablet (1,000 mcg total) by mouth daily. 90 tablet 3   Vitamin D, Ergocalciferol, (DRISDOL) 1.25 MG (50000 UNIT) CAPS capsule Take 50,000 Units by mouth every Wednesday.     hydrochlorothiazide (HYDRODIURIL) 25 MG tablet  (Patient not taking: Reported on 03/06/2023)     No current facility-administered medications for this visit.   No Known Allergies  Past Medical History:  Diagnosis Date   Anginal pain (HCC) 01/14/2020   Arthritis    Blood transfusion without reported  diagnosis    Chickenpox    Dysrhythmia 01/14/2020   A-Fib   Frequent headaches    GERD (gastroesophageal reflux disease)    Headache    migraines   History of blood transfusion    Hyperlipidemia    Hypertension    Iron deficiency anemia due to chronic blood loss 07/14/2018   PAF (paroxysmal atrial fibrillation) (HCC)    Pre-diabetes  Sleep apnea    CPAP ordered but can't tolerate it   Past Surgical History:  Procedure Laterality Date   CESAREAN SECTION  1997   COLONOSCOPY WITH PROPOFOL N/A 05/19/2016   Procedure: COLONOSCOPY WITH PROPOFOL;  Surgeon: Midge Minium, MD;  Location: [facility] END[facility];  Service: Endoscopy;  Laterality: N/A;   COLONOSCOPY WITH PROPOFOL N/A 08/10/2020   Procedure: COLONOSCOPY WITH PROPOFOL;  Surgeon: Pasty Spillers, MD;  Location: [facility];  Service: Endoscopy;  Laterality: N/A;   ESOPHAGOGASTRODUODENOSCOPY (EGD) WITH PROPOFOL N/A 05/19/2016   Procedure: ESOPHAGOGASTRODUODENOSCOPY (EGD) WITH PROPOFOL;  Surgeon: Midge Minium, MD;  Location: [facility] ENDOSCOPY;  Service: Endoscopy;  Laterality: N/A;   ESOPHAGOGASTRODUODENOSCOPY (EGD) WITH PROPOFOL N/A 08/10/2020   Procedure: ESOPHAGOGASTRODUODENOSCOPY (EGD) WITH PROPOFOL;  Surgeon: Pasty Spillers, MD;  Location: [facility];  Service: Endoscopy;  Laterality: N/A;  sleep apnea   HYSTEROSCOPY WITH D & C N/A 08/13/2018   Procedure: DILATATION AND CURETTAGE /HYSTEROSCOPY WITH ENDOMETRIAL ABLATION;  Surgeon: Natale Milch, MD;  Location: [facility];  Service: Gynecology;  Laterality: N/A;   KNEE SURGERY Bilateral 2010   2008   KNEE SURGERY     right and left ACL   POLYPECTOMY  08/10/2020   Procedure: POLYPECTOMY;  Surgeon: Pasty Spillers, MD;  Location: [facility];  Service: Endoscopy;;   TUBAL LIGATION  1997   XI ROBOTIC ASSISTED HIATAL HERNIA REPAIR N/A 12/17/2020   Procedure: XI ROBOTIC ASSISTED HIATAL HERNIA REPAIR WITH FUNDOPLICATION;  Surgeon: Luretha Murphy, MD;  Location: [facility];  Service: General;  Laterality: N/A;   OB History     Gravida  4   Para  3   Term  3   Preterm      AB  1   Living  3      SAB  1   IAB      Ectopic      Multiple      Live Births  3        Obstetric Comments  S/p BTL        Social History   Tobacco Use   Smoking status: Never   Smokeless tobacco: Never  Substance Use Topics   Alcohol use: Yes    Alcohol/week: 1.0 standard drink of alcohol    Types: 1 Cans of beer per week    Comment: occational   Social History   Substance and Sexual Activity  Sexual Activity Not Currently   Birth control/protection: Surgical   Comment: Tubal ligation     Immunization History  Administered Date(s) Administered   Influenza-Unspecified 11/04/2014   Tdap 11/18/2014     Review Of Systems  Constitutional: Denied constitutional symptoms, night sweats, recent illness, fatigue, fever, insomnia and weight loss.  Eyes: Denied eye symptoms, eye pain, photophobia, vision change and visual disturbance.  Ears/Nose/Throat/Neck: Denied ear, nose, throat or neck symptoms, hearing loss, nasal discharge, sinus congestion and sore throat.  Cardiovascular: Denied cardiovascular symptoms, arrhythmia, chest pain/pressure, edema, exercise intolerance, orthopnea and palpitations.  Respiratory: Denied pulmonary symptoms, asthma, pleuritic pain, productive sputum, cough, dyspnea and wheezing.  Gastrointestinal: Denied, gastro-esophageal reflux, melena, nausea and vomiting.  Genitourinary: +BRB while wiping, pelvic relaxation issues, and urinary complaints.  Musculoskeletal: Denied musculoskeletal symptoms, stiffness, swelling, muscle weakness and myalgia.  Dermatologic: Denied dermatology symptoms, rash and scar.  Neurologic: Denied neurology symptoms, dizziness, headache, neck pain and syncope.  Psychiatric: Denied psychiatric symptoms, anxiety and depression.  Endocrine: Denied endocrine symptoms  including hot flashes and  night sweats.      Objective:    BP (!) 146/88   Pulse 65   Ht 5\' 4"  (1.626 m)   Wt 209 lb 4.8 oz (94.9 kg)   BMI 35.93 kg/m   Constitutional: Well-developed, well-nourished female in no acute distress Neurological: Alert and oriented to person, place, and time Psychiatric: Mood and affect appropriate Skin: No rashes or lesions  Breast Exam:  deferred, normal mammogram last week Genitourinary:         External Genitalia: Normal female genitalia    Vagina: hypo-estrogenic,  friable    Cervix: No lesions, normal size and consistency; no cervical motion tenderness     Uterus: Normal size and contour; smooth, mobile, NT, retroverted. Adnexae: Non-palpable and non-tender Perineum/Anus: No lesions Rectal: deferred   Oley Balm, CNM 03/06/23 11:50 AM

## 2023-03-07 LAB — HEP, RPR, HIV PANEL
HIV Screen 4th Generation wRfx: NONREACTIVE
Hepatitis B Surface Ag: NEGATIVE
RPR Ser Ql: NONREACTIVE

## 2023-03-07 LAB — CERVICOVAGINAL ANCILLARY ONLY
Bacterial Vaginitis (gardnerella): NEGATIVE
Candida Glabrata: NEGATIVE
Candida Vaginitis: NEGATIVE
Chlamydia: NEGATIVE
Comment: NEGATIVE
Comment: NEGATIVE
Comment: NEGATIVE
Comment: NEGATIVE
Comment: NEGATIVE
Comment: NORMAL
Neisseria Gonorrhea: NEGATIVE
Trichomonas: NEGATIVE

## 2023-03-07 LAB — FSH/LH
FSH: 97.8 m[IU]/mL
LH: 74 m[IU]/mL

## 2023-03-08 LAB — CYTOLOGY - PAP
Comment: NEGATIVE
Diagnosis: NEGATIVE
High risk HPV: NEGATIVE

## 2023-03-09 ENCOUNTER — Other Ambulatory Visit: Payer: Self-pay | Admitting: Certified Nurse Midwife

## 2023-03-09 MED ORDER — ESTRADIOL 0.1 MG/GM VA CREA: 1.0000 | TOPICAL_CREAM | Freq: Every day | VAGINAL | 12 refills | Status: AC

## 2023-06-05 ENCOUNTER — Encounter (INDEPENDENT_AMBULATORY_CARE_PROVIDER_SITE_OTHER): Payer: Self-pay
